# Patient Record
Sex: Male | Born: 1995
Health system: Southern US, Community
[De-identification: ages and names within clinical notes are randomized; demographics above are authoritative.]

## PROBLEM LIST (undated history)

## (undated) DIAGNOSIS — Z8616 Personal history of COVID-19: Secondary | ICD-10-CM

## (undated) DIAGNOSIS — I420 Dilated cardiomyopathy: Secondary | ICD-10-CM

## (undated) DIAGNOSIS — I513 Intracardiac thrombosis, not elsewhere classified: Secondary | ICD-10-CM

## (undated) DIAGNOSIS — I10 Essential (primary) hypertension: Secondary | ICD-10-CM

## (undated) DIAGNOSIS — F32A Depression, unspecified: Secondary | ICD-10-CM

## (undated) DIAGNOSIS — F419 Anxiety disorder, unspecified: Secondary | ICD-10-CM

## (undated) HISTORY — PX: TONSILLECTOMY: SUR1361

## (undated) NOTE — *Deleted (*Deleted)
Patient ID: Joel [name], male   DOB: 1995-09-01, 85 y.o.   MRN: [id number]    Principal Problem:   Acute systolic CHF (congestive heart failure) (HCC) Active Problems:   LV (left ventricular) mural thrombus without MI (HCC)   Dilated cardiomyopathy (HCC)   History of COVID-19   Multifocal pneumonia   Cerebral embolism with cerebral infarction   See complete radiology results below  MRI head showed multifocal bilateral frontoparietal infarcts.   CMRI 1. Possible left basilar PNA. 2. Small circumferential pericardial effusion. 3. Severe LV dilation with diffuse hypokinesis, EF 15%. There is a small LV apical thrombus. 4. Moderate RV dilation with EF 19%. 5. LGE pattern concerning for myocarditis, with diffuse patchy mid-wall LGE.  Joel [name] is a 52 y.o. male with a hx ofhypertension,anxiety and prior COVID-19 infection in June in 2021 who presented to [facility] with shortness of breath and hemoptysis x 1 month. An echocardiogram demonstrated EF <20% and an LV thrombus. A CT scan demonstrated multifocal pneumonia. He was started on Furosemide and IV Heparin and transferred to [facility] 10/24 for further evaluation and management.  Consultants: None  1. Acute Biventricular HF: Echo at [facility] with EF <20%, LV thrombus noted. He has had dyspnea, orthopnea, and pleuritic chest pain since he had COVID-19 in June. He feels like he never improved. No family history of cardiomyopathy or premature CAD. HIV negative. CMRI LVEF 15%, RVEF 19%, LGE pattern concerning for myocarditis. Suspect most likely etiology for cardiomyopathy is viral myocarditis due to COVID-19. CVP 5today, he is now off milrinone with co-ox 62%. BP controlled. -Continue Lasix 40 mg daily.  - Continue digoxin 0.125 mg.  - Continue Entresto 97/103 bid.  - Spironolactone 25 daily. - Continue Farxiga 10 mg daily  -Increase Coreg to 6.25 mg bid.  2. ID: CT chest and CMRI  concerning for multifocal PNA. COVID-19 negative at [place]. ?Sequelae of prior COVID-19 infection with lung scarring or bacterial PNA.  Blood cultures negative x2.  Respiratory culture with rare gram-positive cocci, no WBCs.He completed a course of ceftriaxone/doxycycline for CAP.  He remained afebrile with only minimal elevation in his WBCs.  3. LV thrombus:On warfarin,INR 2.2today.   4. AKI: BUN/creatinine on admission 26/1.78, steadily improved, discharge labs below  5. CVA: Cardioembolic. Seems to have minimal residual symptoms (tingling left upper arm). Therapeutic INR on warfarin.  6. Suspect OSA: Sleep study as outpatient.  7. Disposition: Home today. Will need followup in coumadin clinic at [facility] office. Followup CHF clinic 7-10 days. Meds for home: Lasix 40 mg daily, digoxin 0.125 daily, Entresto 97/103 bid, warfarin per coumadin clinic, spironolactone 25 mg daily, Farxiga 10 mg daily, Coreg 6.25 mg bid, atorvastatin 10 mg daily.

---
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
## 2017-02-11 DIAGNOSIS — J019 Acute sinusitis, unspecified: Secondary | ICD-10-CM | POA: Diagnosis not present

## 2019-10-26 DIAGNOSIS — J302 Other seasonal allergic rhinitis: Secondary | ICD-10-CM | POA: Insufficient documentation

## 2020-01-27 ENCOUNTER — Telehealth: Payer: Self-pay | Admitting: Infectious Diseases

## 2020-01-27 ENCOUNTER — Other Ambulatory Visit: Payer: Self-pay | Admitting: Infectious Diseases

## 2020-01-27 DIAGNOSIS — U071 COVID-19: Secondary | ICD-10-CM

## 2020-01-27 DIAGNOSIS — Z6837 Body mass index (BMI) 37.0-37.9, adult: Secondary | ICD-10-CM

## 2020-01-27 NOTE — Progress Notes (Signed)
I connected by phone with Joel [name] on 01/27/2020 at 1:30 PM to discuss the potential use of a new treatment for mild to moderate COVID-19 viral infection in non-hospitalized patients.  This patient is a 24 y.o. male that meets the FDA criteria for Emergency Use Authorization of COVID monoclonal antibody casirivimab/imdevimab.  Has a (+) direct SARS-CoV-2 viral test result  Has mild or moderate COVID-19   Is NOT hospitalized due to COVID-19  Is within 10 days of symptom onset  Has at least one of the high risk factor(s) for progression to severe COVID-19 and/or hospitalization as defined in EUA.  Specific high risk criteria : BMI > 25   I have spoken and communicated the following to the patient or parent/caregiver regarding COVID monoclonal antibody treatment:  1. FDA has authorized the emergency use for the treatment of mild to moderate COVID-19 in adults and pediatric patients with positive results of direct SARS-CoV-2 viral testing who are 16 years of age and older weighing at least 40 kg, and who are at high risk for progressing to severe COVID-19 and/or hospitalization.  2. The significant known and potential risks and benefits of COVID monoclonal antibody, and the extent to which such potential risks and benefits are unknown.  3. Information on available alternative treatments and the risks and benefits of those alternatives, including clinical trials.  4. Patients treated with COVID monoclonal antibody should continue to self-isolate and use infection control measures (e.g., wear mask, isolate, social distance, avoid sharing personal items, clean and disinfect "high touch" surfaces, and frequent handwashing) according to CDC guidelines.   5. The patient or parent/caregiver has the option to accept or refuse COVID monoclonal antibody treatment.  After reviewing this information with the patient, The patient agreed to proceed with receiving casirivimab\imdevimab infusion  and will be provided a copy of the Fact sheet prior to receiving the infusion.   [name] 01/27/2020 1:30 PM

## 2020-01-27 NOTE — Telephone Encounter (Signed)
Called to discuss with patient about Covid symptoms and the use of Regeneron, a monoclonal antibody infusion for those with mild to moderate Covid symptoms and at a high risk of hospitalization.  Pt is qualified for this infusion at the [facility] infusion center due to BMI>35.    Symptoms started Sunday 7/25 with loss of taste and smell, fevers, body aches, cough.   The following was reviewed with the patient including possible cost associated with he appointment.   Infusion clinic site is:  --[address] - there is a Freight forwarder marking the entrance to parking.     --Park in one of the marked spaces and call the front desk for assistance inside [phone].   --Average time in department is roughly 3 hours for Regeneron treatment - this includes preparation of the medication, IV start and the required 1 hour monitoring after the infusion.    Should you develop worsening shortness of breath, chest pain or severe breathing problems please do not wait for this appointment and go to the Emergency room for evaluation and treatment.   The day of your visit you should: Marland Kitchen Get plenty of rest the night before and drink plenty of water . Eat a light meal/snack before coming and take your medications as prescribed  . Wear warm, comfortable clothes with a shirt that can roll-up over the elbow (will need IV start).  . Wear a mask  . Consider bringing some activity to help pass the time  I hope this helps find you feeling better,  [name], NP

## 2020-01-28 MED ORDER — SODIUM CHLORIDE 0.9 % IV SOLN
Freq: Once | INTRAVENOUS | Status: AC
  Filled 2020-01-28: qty 600

## 2020-01-29 ENCOUNTER — Ambulatory Visit (HOSPITAL_COMMUNITY)
Admission: RE | Admit: 2020-01-29 | Discharge: 2020-01-29 | Disposition: A | Discharge status: Home / Self Care | Payer: HRSA Program | Source: Ambulatory Visit | Attending: Pulmonary Disease | Admitting: Pulmonary Disease

## 2020-01-29 DIAGNOSIS — U071 COVID-19: Secondary | ICD-10-CM | POA: Diagnosis not present

## 2020-01-29 DIAGNOSIS — Z6837 Body mass index (BMI) 37.0-37.9, adult: Secondary | ICD-10-CM | POA: Insufficient documentation

## 2020-01-29 MED ORDER — METHYLPREDNISOLONE SODIUM SUCC 125 MG IJ SOLR: 125.0000 mg | Freq: Once | INTRAMUSCULAR | Status: DC | PRN

## 2020-01-29 MED ORDER — EPINEPHRINE 0.3 MG/0.3ML IJ SOAJ: 0.3000 mg | Freq: Once | INTRAMUSCULAR | Status: DC | PRN

## 2020-01-29 MED ORDER — FAMOTIDINE IN NACL 20-0.9 MG/50ML-% IV SOLN: 20.0000 mg | Freq: Once | INTRAVENOUS | Status: DC | PRN

## 2020-01-29 MED ORDER — SODIUM CHLORIDE 0.9 % IV SOLN: INTRAVENOUS | Status: DC | PRN

## 2020-01-29 MED ORDER — DIPHENHYDRAMINE HCL 50 MG/ML IJ SOLN: 50.0000 mg | Freq: Once | INTRAMUSCULAR | Status: DC | PRN

## 2020-01-29 MED ORDER — ALBUTEROL SULFATE HFA 108 (90 BASE) MCG/ACT IN AERS: 2.0000 | INHALATION_SPRAY | Freq: Once | RESPIRATORY_TRACT | Status: DC | PRN

## 2020-01-29 NOTE — Progress Notes (Addendum)
  Diagnosis: COVID-19  Physician:Dr Delford Field   Procedure: Covid Infusion Clinic Med: casirivimab\imdevimab infusion - Provided patient with casirivimab\imdevimab fact sheet for patients, parents and caregivers prior to infusion.  Complications: Pt had vasovagal response when IV infiltrated. Pt had hypotension,was diaphoretic and pale.  Discharge: Discharged home   Saul Fordyce 01/29/2020

## 2020-01-29 NOTE — Discharge Instructions (Signed)

## 2020-04-24 ENCOUNTER — Inpatient Hospital Stay (HOSPITAL_COMMUNITY): Payer: Self-pay

## 2020-04-24 ENCOUNTER — Other Ambulatory Visit: Payer: Self-pay

## 2020-04-24 ENCOUNTER — Encounter (HOSPITAL_COMMUNITY): Payer: Self-pay | Admitting: Cardiology

## 2020-04-24 ENCOUNTER — Inpatient Hospital Stay (HOSPITAL_COMMUNITY)
Admission: AD | Admit: 2020-04-24 | Discharge: 2020-05-01 | DRG: 291 | Disposition: A | Discharge status: Home / Self Care | Payer: Self-pay | Source: Other Acute Inpatient Hospital | Attending: Cardiology | Admitting: Cardiology

## 2020-04-24 ENCOUNTER — Inpatient Hospital Stay: Payer: Self-pay

## 2020-04-24 DIAGNOSIS — I5082 Biventricular heart failure: Secondary | ICD-10-CM | POA: Diagnosis not present

## 2020-04-24 DIAGNOSIS — I5021 Acute systolic (congestive) heart failure: Secondary | ICD-10-CM

## 2020-04-24 DIAGNOSIS — I240 Acute coronary thrombosis not resulting in myocardial infarction: Secondary | ICD-10-CM

## 2020-04-24 DIAGNOSIS — I513 Intracardiac thrombosis, not elsewhere classified: Secondary | ICD-10-CM | POA: Diagnosis present

## 2020-04-24 DIAGNOSIS — I420 Dilated cardiomyopathy: Secondary | ICD-10-CM

## 2020-04-24 DIAGNOSIS — Z79899 Other long term (current) drug therapy: Secondary | ICD-10-CM

## 2020-04-24 DIAGNOSIS — I5023 Acute on chronic systolic (congestive) heart failure: Secondary | ICD-10-CM | POA: Diagnosis present

## 2020-04-24 DIAGNOSIS — B3324 Viral cardiomyopathy: Secondary | ICD-10-CM | POA: Diagnosis present

## 2020-04-24 DIAGNOSIS — J129 Viral pneumonia, unspecified: Secondary | ICD-10-CM | POA: Diagnosis present

## 2020-04-24 DIAGNOSIS — I110 Hypertensive heart disease with heart failure: Principal | ICD-10-CM | POA: Diagnosis present

## 2020-04-24 DIAGNOSIS — R29707 NIHSS score 7: Secondary | ICD-10-CM | POA: Diagnosis not present

## 2020-04-24 DIAGNOSIS — I5022 Chronic systolic (congestive) heart failure: Secondary | ICD-10-CM

## 2020-04-24 DIAGNOSIS — B948 Sequelae of other specified infectious and parasitic diseases: Secondary | ICD-10-CM

## 2020-04-24 DIAGNOSIS — I400 Infective myocarditis: Secondary | ICD-10-CM | POA: Diagnosis present

## 2020-04-24 DIAGNOSIS — J188 Other pneumonia, unspecified organism: Secondary | ICD-10-CM

## 2020-04-24 DIAGNOSIS — N179 Acute kidney failure, unspecified: Secondary | ICD-10-CM | POA: Diagnosis present

## 2020-04-24 DIAGNOSIS — I6340 Cerebral infarction due to embolism of unspecified cerebral artery: Secondary | ICD-10-CM | POA: Insufficient documentation

## 2020-04-24 DIAGNOSIS — Z8616 Personal history of COVID-19: Secondary | ICD-10-CM

## 2020-04-24 DIAGNOSIS — Z823 Family history of stroke: Secondary | ICD-10-CM

## 2020-04-24 DIAGNOSIS — G8101 Flaccid hemiplegia affecting right dominant side: Secondary | ICD-10-CM | POA: Diagnosis not present

## 2020-04-24 DIAGNOSIS — I313 Pericardial effusion (noninflammatory): Secondary | ICD-10-CM | POA: Diagnosis present

## 2020-04-24 DIAGNOSIS — I63412 Cerebral infarction due to embolism of left middle cerebral artery: Secondary | ICD-10-CM | POA: Diagnosis not present

## 2020-04-24 DIAGNOSIS — R0989 Other specified symptoms and signs involving the circulatory and respiratory systems: Secondary | ICD-10-CM | POA: Diagnosis present

## 2020-04-24 DIAGNOSIS — I340 Nonrheumatic mitral (valve) insufficiency: Secondary | ICD-10-CM

## 2020-04-24 DIAGNOSIS — Z23 Encounter for immunization: Secondary | ICD-10-CM

## 2020-04-24 DIAGNOSIS — I50814 Right heart failure due to left heart failure: Secondary | ICD-10-CM | POA: Diagnosis present

## 2020-04-24 DIAGNOSIS — Z833 Family history of diabetes mellitus: Secondary | ICD-10-CM

## 2020-04-24 DIAGNOSIS — R0602 Shortness of breath: Secondary | ICD-10-CM

## 2020-04-24 DIAGNOSIS — F419 Anxiety disorder, unspecified: Secondary | ICD-10-CM | POA: Diagnosis present

## 2020-04-24 DIAGNOSIS — G839 Paralytic syndrome, unspecified: Secondary | ICD-10-CM | POA: Diagnosis present

## 2020-04-24 DIAGNOSIS — J189 Pneumonia, unspecified organism: Secondary | ICD-10-CM

## 2020-04-24 DIAGNOSIS — F32A Depression, unspecified: Secondary | ICD-10-CM | POA: Diagnosis present

## 2020-04-24 DIAGNOSIS — I361 Nonrheumatic tricuspid (valve) insufficiency: Secondary | ICD-10-CM

## 2020-04-24 DIAGNOSIS — I429 Cardiomyopathy, unspecified: Secondary | ICD-10-CM

## 2020-04-24 DIAGNOSIS — Z720 Tobacco use: Secondary | ICD-10-CM

## 2020-04-24 DIAGNOSIS — I63312 Cerebral infarction due to thrombosis of left middle cerebral artery: Secondary | ICD-10-CM | POA: Diagnosis not present

## 2020-04-24 DIAGNOSIS — I472 Ventricular tachycardia: Secondary | ICD-10-CM | POA: Diagnosis not present

## 2020-04-24 HISTORY — DX: Depression, unspecified: F32.A

## 2020-04-24 HISTORY — DX: Personal history of COVID-19: Z86.16

## 2020-04-24 HISTORY — DX: Anxiety disorder, unspecified: F41.9

## 2020-04-24 HISTORY — DX: Chronic systolic (congestive) heart failure: I50.22

## 2020-04-24 HISTORY — DX: Essential (primary) hypertension: I10

## 2020-04-24 HISTORY — DX: Dilated cardiomyopathy: I42.0

## 2020-04-24 HISTORY — DX: Intracardiac thrombosis, not elsewhere classified: I51.3

## 2020-04-24 LAB — CBC
HCT: 44.6 % (ref 39.0–52.0)
Hemoglobin: 14.3 g/dL (ref 13.0–17.0)
MCH: 30.2 pg (ref 26.0–34.0)
MCHC: 32.1 g/dL (ref 30.0–36.0)
MCV: 94.1 fL (ref 80.0–100.0)
Platelets: 233 10*3/uL (ref 150–400)
RBC: 4.74 MIL/uL (ref 4.22–5.81)
RDW: 14.5 % (ref 11.5–15.5)
WBC: 15.4 10*3/uL — ABNORMAL HIGH (ref 4.0–10.5)
nRBC: 0 % (ref 0.0–0.2)

## 2020-04-24 LAB — GLUCOSE, CAPILLARY
Glucose-Capillary: 97 mg/dL (ref 70–99)
Glucose-Capillary: 107 mg/dL — ABNORMAL HIGH (ref 70–99)

## 2020-04-24 LAB — HEPARIN LEVEL (UNFRACTIONATED): Heparin Unfractionated: <0.1 IU/mL — ABNORMAL LOW (ref 0.30–0.70)

## 2020-04-24 LAB — COOXEMETRY PANEL
Carboxyhemoglobin: 1.2 % (ref 0.5–1.5)
Methemoglobin: 0.8 % (ref 0.0–1.5)
O2 Saturation: 52.5 %
Total hemoglobin: 14.7 g/dL (ref 12.0–16.0)

## 2020-04-24 LAB — PROCALCITONIN: Procalcitonin: 0.92 ng/mL

## 2020-04-24 LAB — MRSA PCR SCREENING: MRSA by PCR: NEGATIVE

## 2020-04-24 LAB — HIV ANTIBODY (ROUTINE TESTING W REFLEX): HIV Screen 4th Generation wRfx: NONREACTIVE

## 2020-04-24 MED ORDER — DOXYCYCLINE HYCLATE 100 MG PO TABS
100.0000 mg | ORAL_TABLET | Freq: Two times a day (BID) | ORAL | Status: DC
  Administered 2020-04-24 – 2020-04-29 (×10): 100 mg via ORAL
  Filled 2020-04-24 (×11): qty 1

## 2020-04-24 MED ORDER — AZITHROMYCIN 250 MG PO TABS: 250.0000 mg | ORAL_TABLET | Freq: Every day | ORAL | Status: DC

## 2020-04-24 MED ORDER — POTASSIUM CHLORIDE CRYS ER 20 MEQ PO TBCR
20.0000 meq | EXTENDED_RELEASE_TABLET | Freq: Two times a day (BID) | ORAL | Status: DC
  Administered 2020-04-24 – 2020-04-25 (×2): 20 meq via ORAL
  Filled 2020-04-24 (×2): qty 1

## 2020-04-24 MED ORDER — SODIUM CHLORIDE 0.9% FLUSH
3.0000 mL | Freq: Two times a day (BID) | INTRAVENOUS | Status: DC
  Administered 2020-04-24 – 2020-05-01 (×11): 3 mL via INTRAVENOUS

## 2020-04-24 MED ORDER — SODIUM CHLORIDE 0.9% FLUSH: 10.0000 mL | INTRAVENOUS | Status: DC | PRN

## 2020-04-24 MED ORDER — SODIUM CHLORIDE 0.9 % IV SOLN
1.0000 g | INTRAVENOUS | Status: DC
  Administered 2020-04-24 – 2020-04-25 (×2): 1 g via INTRAVENOUS
  Filled 2020-04-24 (×3): qty 10

## 2020-04-24 MED ORDER — SODIUM CHLORIDE 0.9 % IV SOLN: 250.0000 mL | INTRAVENOUS | Status: DC | PRN

## 2020-04-24 MED ORDER — CHLORHEXIDINE GLUCONATE CLOTH 2 % EX PADS
6.0000 | MEDICATED_PAD | Freq: Every day | CUTANEOUS | Status: DC
  Administered 2020-04-24 – 2020-05-01 (×7): 6 via TOPICAL

## 2020-04-24 MED ORDER — SACUBITRIL-VALSARTAN 24-26 MG PO TABS
1.0000 | ORAL_TABLET | Freq: Two times a day (BID) | ORAL | Status: DC
  Administered 2020-04-24 – 2020-04-28 (×8): 1 via ORAL
  Filled 2020-04-24 (×9): qty 1

## 2020-04-24 MED ORDER — SODIUM CHLORIDE 0.9% FLUSH: 3.0000 mL | INTRAVENOUS | Status: DC | PRN

## 2020-04-24 MED ORDER — ONDANSETRON HCL 4 MG/2ML IJ SOLN: 4.0000 mg | Freq: Four times a day (QID) | INTRAMUSCULAR | Status: DC | PRN

## 2020-04-24 MED ORDER — FUROSEMIDE 10 MG/ML IJ SOLN: 40.0000 mg | Freq: Two times a day (BID) | INTRAMUSCULAR | Status: DC

## 2020-04-24 MED ORDER — HEPARIN BOLUS VIA INFUSION
2000.0000 [IU] | Freq: Once | INTRAVENOUS | Status: AC
  Administered 2020-04-24: 2000 [IU] via INTRAVENOUS
  Filled 2020-04-24: qty 2000

## 2020-04-24 MED ORDER — FUROSEMIDE 10 MG/ML IJ SOLN
60.0000 mg | Freq: Two times a day (BID) | INTRAMUSCULAR | Status: DC
  Administered 2020-04-24 – 2020-04-25 (×2): 60 mg via INTRAVENOUS
  Filled 2020-04-24 (×2): qty 6

## 2020-04-24 MED ORDER — SODIUM CHLORIDE 0.9% FLUSH
10.0000 mL | Freq: Two times a day (BID) | INTRAVENOUS | Status: DC
  Administered 2020-04-24 – 2020-05-01 (×13): 10 mL

## 2020-04-24 MED ORDER — HEPARIN (PORCINE) 25000 UT/250ML-% IV SOLN
2200.0000 [IU]/h | INTRAVENOUS | Status: AC
  Administered 2020-04-24: 1250 [IU]/h via INTRAVENOUS
  Administered 2020-04-25: 1900 [IU]/h via INTRAVENOUS
  Administered 2020-04-25: 1650 [IU]/h via INTRAVENOUS
  Filled 2020-04-24 (×4): qty 250

## 2020-04-24 MED ORDER — ACETAMINOPHEN 325 MG PO TABS
650.0000 mg | ORAL_TABLET | ORAL | Status: DC | PRN
  Administered 2020-04-24 – 2020-04-26 (×4): 650 mg via ORAL
  Filled 2020-04-24 (×4): qty 2

## 2020-04-24 MED ORDER — SPIRONOLACTONE 12.5 MG HALF TABLET
12.5000 mg | ORAL_TABLET | Freq: Every day | ORAL | Status: DC
  Administered 2020-04-24 – 2020-04-26 (×3): 12.5 mg via ORAL
  Filled 2020-04-24 (×3): qty 1

## 2020-04-24 MED ORDER — AZITHROMYCIN 500 MG PO TABS
500.0000 mg | ORAL_TABLET | Freq: Every day | ORAL | Status: DC
  Filled 2020-04-24: qty 1

## 2020-04-24 NOTE — Progress Notes (Signed)
ANTICOAGULATION CONSULT NOTE - Initial Consult  Pharmacy Consult for heparin Indication: LV thrombus  No Known Allergies  Patient Measurements: Height: 6\' 2"  (188 cm) Weight: 122.6 kg (270 lb 4.5 oz) IBW/kg (Calculated) : 82.2 Heparin Dosing Weight: 108.7 kg  Vital Signs: Temp: 98.6 F (37 C) (10/24 1400) Temp Source: Oral (10/24 1400) BP: 138/103 (10/24 1435) Pulse Rate: 101 (10/24 1435)  Labs: No results for input(s): HGB, HCT, PLT, APTT, LABPROT, INR, HEPARINUNFRC, HEPRLOWMOCWT, CREATININE, CKTOTAL, CKMB, TROPONINIHS in the last 72 hours.  CrCl cannot be calculated (No successful lab value found.).   Medical History: Past Medical History:  Diagnosis Date  . Acute systolic CHF (congestive heart failure) (HCC) 04/24/2020  . Anxiety   . Depression   . Hypertension     Medications:  Scheduled:  . Chlorhexidine Gluconate Cloth  6 each Topical Daily  . doxycycline  100 mg Oral Q12H  . furosemide  60 mg Intravenous BID  . potassium chloride  20 mEq Oral BID  . sacubitril-valsartan  1 tablet Oral BID  . sodium chloride flush  10-40 mL Intracatheter Q12H  . sodium chloride flush  3 mL Intravenous Q12H  . spironolactone  12.5 mg Oral Daily    Assessment: 24 yof transferred from [facility] with SOB and hemoptysis for 1 month. ECHO found LVEF<20% and LV thrombus.   Per OSH - heparin 5000 units was started at 1130 on 10/24 then 1250 units/hr. Baseline labs included INR 1.2, Hgb 14.6, plt 280. Total bili 2.4, scr 1.4.   Heparin level tonight came back undetectable, on 1250 units/hr. Hgb 14.3, plt 233. No s/sx of bleeding or infusion issues.   Goal of Therapy:  Heparin level 0.3-0.7 units/ml Monitor platelets by anticoagulation protocol: Yes   Plan:  Order 2000 unit bolus Increase heparin infusion to 1450 units/hr Order heparin level in 6 hr Monitor daily HL, CBC, and for s/sx of bleeding  2001, PharmD, BCCCP Clinical Pharmacist  Phone:  [phone] 04/24/2020 9:44 PM  Please check AMION for all [facility] Pharmacy phone numbers After 10:00 PM, call Main Pharmacy [phone]

## 2020-04-24 NOTE — H&P (Addendum)
Cardiology Admission History and Physical:   Patient ID: Joel [name] MRN: [id number]; DOB: 12-25-1995   Admission date: 04/24/2020  Primary Care Provider: No primary care provider on file. [facility] Cardiologist:  New  [facility] Electrophysiologist:  None    Chief Complaint:  Shortness of breath   Patient Profile:   Joel [name] is a 24 y.o. male with a hx of hypertension, anxiety and prior COVID-19 infection in June in 2021 who presented to [facility] with shortness of breath and hemoptysis x 1 month.  An echocardiogram demonstrated EF < 20% and an LV thrombus.  A CT scan demonstrated multifocal pneumonia.  He was started on Furosemide and IV Heparin and transferred to [facility] for further evaluation and management.    History of Present Illness:   Joel [name] has noted worsening shortness of breath of the past month with associated hemoptysis.  He notes symptoms of orthopnea as well as chest tightness.  Chest tightness usually occurs in the mornings.  He has felt palpitations.  He has not had syncope.  He presented to [facility] for further evaluation.  D-dimer was elevated.  CT was negative for pulmonary embolism.  There was multiple infiltrates with suspicion for multifocal pneumonia.  BNP was elevated.  Echocardiogram demonstrated EF <20% and LV thrombus.  He was given IV furosemide and placed on carvedilol.  He was seen by Dr. Bing Matter with [facility] who recommended transfer to [facility] for further evaluation and management.   Past Medical History:  Diagnosis Date  . Anxiety   . Chronic systolic CHF 04/24/2020   Echo at [facility] 04/2020: EF < 20, mild to mod MR, LV thrombus  . Depression   . Dilated cardiomyopathy (HCC)   . History of COVID-19   . Hypertension   . LV (left ventricular) mural thrombus     Past Surgical History:  Procedure Laterality Date  . TONSILLECTOMY       Medications Prior to Admission: Lexapro  20 mg daily, Allegra daily, hydroxyzine 100 mg 3 times a day  Allergies:   No Known Allergies  Social History:   Social History   Socioeconomic History  . Marital status: Single    Spouse name: Not on file  . Number of children: Not on file  . Years of education: Not on file  . Highest education level: Not on file  Occupational History  . Not on file  Tobacco Use  . Smoking status: Former Smoker    Types: E-cigarettes  . Smokeless tobacco: Current User    Types: Chew  . Tobacco comment: quit around June 2021  Vaping Use  . Vaping Use: Former  . Substances: Nicotine  Substance and Sexual Activity  . Alcohol use: Not Currently    Comment: Patient used to drink occasionally a beer, but stopped when started lexapro  . Drug use: Never  . Sexual activity: Yes    Birth control/protection: Condom  Other Topics Concern  . Not on file  Social History Narrative  . Not on file   Social Determinants of Health   Financial Resource Strain:   . Difficulty of Paying Living Expenses: Not on file  Food Insecurity:   . Worried About Programme researcher, broadcasting/film/video in the Last Year: Not on file  . Ran Out of Food in the Last Year: Not on file  Transportation Needs:   . Lack of Transportation (Medical): Not on file  . Lack of Transportation (Non-Medical): Not on  file  Physical Activity:   . Days of Exercise per Week: Not on file  . Minutes of Exercise per Session: Not on file  Stress:   . Feeling of Stress : Not on file  Social Connections:   . Frequency of Communication with Friends and Family: Not on file  . Frequency of Social Gatherings with Friends and Family: Not on file  . Attends Religious Services: Not on file  . Active Member of Clubs or Organizations: Not on file  . Attends Banker Meetings: Not on file  . Marital Status: Not on file  Intimate Partner Violence:   . Fear of Current or Ex-Partner: Not on file  . Emotionally Abused: Not on file  . Physically Abused: Not  on file  . Sexually Abused: Not on file    Family History:   The patient's family history is negative for Heart failure.    ROS:  Please see the history of present illness.  He has not had melena, hematochezia, hematuria. All other ROS reviewed and negative.     Physical Exam/Data:   Vitals:   04/24/20 1340 04/24/20 1400 04/24/20 1435 04/24/20 1500  BP: (!) 136/95  (!) 138/103 (!) 138/116  Pulse:  100 (!) 101 (!) 54  Resp:  (!) 31 (!) 35 (!) 40  Temp:  98.6 F (37 C)    TempSrc:  Oral    SpO2:  99% 97% 93%  Weight:  122.6 kg    Height:  6\' 2"  (1.88 m)      Intake/Output Summary (Last 24 hours) at 04/24/2020 1629 Last data filed at 04/24/2020 1400 Gross per 24 hour  Intake --  Output 300 ml  Net -300 ml   Last 3 Weights 04/24/2020  Weight (lbs) 270 lb 4.5 oz  Weight (kg) 122.6 kg     Body mass index is 34.7 kg/m.  General:  Well nourished, well developed, in no acute distress  HEENT: normal Lymph: no adenopathy Neck: + JVD up to the angle of his jaw Endocrine:  No thryomegaly Vascular: No carotid bruits  Cardiac: Tachycardia, regular rhythm, no obvious murmur Lungs: Diffuse rales noted, no wheezing Abd: soft, nontender, no hepatomegaly  Ext: no edema Musculoskeletal:  No deformities, BUE and BLE strength normal and equal Skin: warm and dry  Neuro:  CNs 2-12 intact, no focal abnormalities noted Psych:  Normal affect    EKG:  The ECG that was done today via [facility] was personally reviewed and demonstrates sinus tachycardia, HR 109, normal axis, T wave inversions 2, 3, aVF, V4-V6, QTC 435  Relevant CV Studies: Echocardiogram report not currently available from [facility] but consult note from cardiology indicates EF <20%, cardiomegaly, mild-moderate mitral regurgitation, pedunculated large thrombus highly mobile in the apex  Laboratory Data: From [facility] 04/23/2020:  Hgb 14.6 BUN 21 SCr 1.4 K+4.9 ALT 41 Total bilirubin 2.4 Lactic Acid  1.1 DDimer 1330 ProBNP 8050 Ferritin 135 CRP 61.5 Procalcitonin < 0.05 TnI 0.13-0.13-0.13 Inf A, Inf B, SARS-CoV-2 neg  Chest CTA No pulmonary embolism, LLL, RUL, RLL infiltrates c/w multifocal pneumonia, cardiac enlargement, small pericardial effusion  Radiology/Studies:  02-09-1982 EKG SITE RITE  Result Date: 04/24/2020 If Site Rite image not attached, placement could not be confirmed due to current cardiac rhythm.    Assessment and Plan:   1. Acute systolic CHF 2. Dilated CM He probably has a non-ischemic cardiomyopathy.  Question if he had myocarditis related to COVID-19 that resulted in his CM.  He  has multiple infiltrates on CT suspicious for pneumonia.  However, his Lactic Acid and Procalcitonin are normal.  This is probably edema.  Will hold off on antibiotics for now.  He will probably need a cardiac MRI to further evaluate for infiltrative CM.  Ferritin normal.  He says his BP is always high when he goes to the doctor but has never been put on meds.  However, I do not think this is a hypertensive CM.  Will get TSH.  Will hold off on beta-blocker give acute CHF.  His Creatinine should be able to tolerate ARNI.  Will continue diuresis.  -DC Carvedilol  -Start Entresto 24/26 mg twice daily   -Get PICC line or triple lumen placed  -Check Co-ox; follow CVP  -Continue Furosemide 40 mg IV twice daily  -K+ 20 mEq twice daily   -BMET Q AM   -Check TSH   3. LV Thrombus Continue IV Heparin.  Pharmacy to follow.  Will eventually need to transition to Warfarin or DOAC.    Attending MD to see later today.       New York Heart Association (NYHA) Functional Class NYHA Class IV   Severity of Illness: The appropriate patient status for this patient is INPATIENT. Inpatient status is judged to be reasonable and necessary in order to provide the required intensity of service to ensure the patient's safety. The patient's presenting symptoms, physical exam findings, and initial radiographic  and laboratory data in the context of their chronic comorbidities is felt to place them at high risk for further clinical deterioration. Furthermore, it is not anticipated that the patient will be medically stable for discharge from the hospital within 2 midnights of admission. The following factors support the patient status of inpatient.   " The patient's presenting symptoms include shortness of breath, hemoptysis. " The worrisome physical exam findings include rales, JVD. " The initial radiographic and laboratory data are worrisome because of multiple infiltrates on CT. " The chronic co-morbidities include dilated cardiomyopathy, systolic heart failure, left ventricular thrombus.   * I certify that at the point of admission it is my clinical judgment that the patient will require inpatient hospital care spanning beyond 2 midnights from the point of admission due to high intensity of service, high risk for further deterioration and high frequency of surveillance required.*    For questions or updates, please contact [facility] Please consult [email or web address] for contact info under     Signed, Tereso Newcomer, PA-C  04/24/2020 4:29 PM    Patient seen with PA, agree with the above note.   24 yo with history of HTN and COVID-19 infection in June was transferred from [facility] with acute systolic CHF and possible PNA.    Patient has no history of cardiac disease, also no significant family history of cardiac disease other than HTN.  He had COVID-19 in 6/21 and has not felt well since that time.  He has struggled with dyspnea and pleuritic chest tightness.  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  He has significant orthopnea. He has been back and forth to his PCP, was told he was anxious.  Finally, since he was not getting anywhere as an outpatient and dyspnea seemed to be worsening, he went to ER at [facility]. He was admitted and  echo was done, showing EF < 20% with LV apical thrombus.  He was started on heparin gtt. BP  elevated.   Additionally, CT chest was done that was concerning for multifocal PNA.  He has been afebrile.  PCT < 0.1 at [facility]. TnI 0.13 => 0.13.   General: NAD Neck: JVP 14+ cm,, no thyromegaly or thyroid nodule.  Lungs: Bilateral rhonchi.  CV: Nondisplaced PMI.  Heart regular S1/S2, no S3/S4, no murmur.  No peripheral edema.  No carotid bruit.  Normal pedal pulses.  Abdomen: Soft, nontender, no hepatosplenomegaly, no distention.  Skin: Intact without lesions or rashes.  Neurologic: Alert and oriented x 3.  Psych: Normal affect. Extremities: No clubbing or cyanosis.  HEENT: Normal.   1. Acute systolic CHF: Echo at [facility] with EF < 20%, LV thrombus noted.  He has had dyspnea, orthopnea, and pleuritic chest pain since he had COVID-19 in June. He feels like he never improved.  No family history of cardiomyopathy or premature CAD.  Suspect most likely etiology for cardiomyopathy is viral myocarditis due to COVID-19.  On exam, he is volume overloaded.   - PICC placed, will follow CVP and co-ox.  - Lasix 60 mg IV bid and follow response.  - Entresto 24/26 bid (has plenty of BP room).  - Spironolactone 12.5 daily.  - I will arrange for cardiac MRI to look for myocarditis/infiltrative disease.  - Check HIV, TSH.  2. ID: CT chest concerning for multifocal PNA.  No fever, WBCs normal and PCT not elevated.  ?Sequelae of prior COVID-19 infection.  - Will resend COVID-19.  - Culture blood and sputum.  - For now, will cover with ceftriaxone/doxycycline for CAP.  Low threshold to stop abx.  3. LV thrombus: Start heparin gtt, will eventually need warfarin.   [name] 04/24/2020 6:09 PM

## 2020-04-24 NOTE — Progress Notes (Signed)
Peripherally Inserted Central Catheter Placement  The IV Nurse has discussed with the patient and/or persons authorized to consent for the patient, the purpose of this procedure and the potential benefits and risks involved with this procedure.  The benefits include less needle sticks, lab draws from the catheter, and the patient may be discharged home with the catheter. Risks include, but not limited to, infection, bleeding, blood clot (thrombus formation), and puncture of an artery; nerve damage and irregular heartbeat and possibility to perform a PICC exchange if needed/ordered by physician.  Alternatives to this procedure were also discussed.  Bard Power PICC patient education guide, fact sheet on infection prevention and patient information card has been provided to patient /or left at bedside.    PICC Placement Documentation  PICC Triple Lumen 04/24/20 PICC Right Basilic 40 cm 0 cm (Active)  Indication for Insertion or Continuance of Line Vasoactive infusions;Prolonged intravenous therapies 04/24/20 1806  Exposed Catheter (cm) 0 cm 04/24/20 1806  Site Assessment Clean;Dry;Intact 04/24/20 1806  Lumen #1 Status Flushed;Saline locked;Blood return noted 04/24/20 1806  Lumen #2 Status Flushed;Saline locked;Blood return noted 04/24/20 1806  Lumen #3 Status Flushed;Saline locked;Blood return noted 04/24/20 1806  Dressing Type Transparent 04/24/20 1806  Dressing Status Clean;Dry;Intact 04/24/20 1806  Antimicrobial disc in place? Yes 04/24/20 1806  Safety Lock Not Applicable 04/24/20 1806  Line Care Connections checked and tightened 04/24/20 1806  Line Adjustment (NICU/IV Team Only) No 04/24/20 1806  Dressing Intervention New dressing 04/24/20 1806  Dressing Change Due 05/01/20 04/24/20 1806       Hughie Melroy, Lajean Manes 04/24/2020, 6:07 PM

## 2020-04-25 ENCOUNTER — Inpatient Hospital Stay (HOSPITAL_COMMUNITY): Payer: Self-pay

## 2020-04-25 DIAGNOSIS — I240 Acute coronary thrombosis not resulting in myocardial infarction: Secondary | ICD-10-CM

## 2020-04-25 DIAGNOSIS — I429 Cardiomyopathy, unspecified: Secondary | ICD-10-CM

## 2020-04-25 LAB — BASIC METABOLIC PANEL
Anion gap: 11 (ref 5–15)
BUN: 26 mg/dL — ABNORMAL HIGH (ref 6–20)
CO2: 21 mmol/L — ABNORMAL LOW (ref 22–32)
Calcium: 8.3 mg/dL — ABNORMAL LOW (ref 8.9–10.3)
Chloride: 104 mmol/L (ref 98–111)
Creatinine, Ser: 1.78 mg/dL — ABNORMAL HIGH (ref 0.61–1.24)
GFR, Estimated: 55 mL/min — ABNORMAL LOW (ref >60)
Glucose, Bld: 113 mg/dL — ABNORMAL HIGH (ref 70–99)
Potassium: 4.5 mmol/L (ref 3.5–5.1)
Sodium: 136 mmol/L (ref 135–145)

## 2020-04-25 LAB — COOXEMETRY PANEL
Carboxyhemoglobin: 1.1 % (ref 0.5–1.5)
Methemoglobin: 0.8 % (ref 0.0–1.5)
O2 Saturation: 54.9 %
Total hemoglobin: 16.6 g/dL — ABNORMAL HIGH (ref 12.0–16.0)

## 2020-04-25 LAB — EXPECTORATED SPUTUM ASSESSMENT W GRAM STAIN, RFLX TO RESP C

## 2020-04-25 LAB — TSH: TSH: 2.89 u[IU]/mL (ref 0.350–4.500)

## 2020-04-25 LAB — HEPARIN LEVEL (UNFRACTIONATED)
Heparin Unfractionated: <0.1 IU/mL — ABNORMAL LOW (ref 0.30–0.70)
Heparin Unfractionated: <0.1 IU/mL — ABNORMAL LOW (ref 0.30–0.70)

## 2020-04-25 MED ORDER — MILRINONE LACTATE IN DEXTROSE 20-5 MG/100ML-% IV SOLN
0.1250 ug/kg/min | INTRAVENOUS | Status: DC
  Administered 2020-04-25 – 2020-04-29 (×5): 0.125 ug/kg/min via INTRAVENOUS
  Filled 2020-04-25 (×6): qty 100

## 2020-04-25 MED ORDER — HEPARIN BOLUS VIA INFUSION
2000.0000 [IU] | Freq: Once | INTRAVENOUS | Status: AC
  Administered 2020-04-25: 2000 [IU] via INTRAVENOUS
  Filled 2020-04-25: qty 2000

## 2020-04-25 MED ORDER — DIGOXIN 125 MCG PO TABS
0.1250 mg | ORAL_TABLET | Freq: Every day | ORAL | Status: DC
  Administered 2020-04-25 – 2020-05-01 (×7): 0.125 mg via ORAL
  Filled 2020-04-25 (×7): qty 1

## 2020-04-25 MED ORDER — GADOBUTROL 1 MMOL/ML IV SOLN
10.0000 mL | Freq: Once | INTRAVENOUS | Status: AC | PRN
  Administered 2020-04-25: 10 mL via INTRAVENOUS

## 2020-04-25 NOTE — Progress Notes (Signed)
No complaints.   CVP:  [6 mmHg-19 mmHg] 6 mmHg  CO-OX 55% Worsening creatinine.  Milrinone started at 1518 and digoxin added.  Check CO-OX in am.    Shakeema Lippman NP-C  3:43 PM

## 2020-04-25 NOTE — Plan of Care (Signed)
  Problem: Education: Goal: Knowledge of General Education information will improve Description: Including pain rating scale, medication(s)/side effects and non-pharmacologic comfort measures Outcome: Progressing   Problem: Health Behavior/Discharge Planning: Goal: Ability to manage health-related needs will improve Outcome: Progressing   Problem: Clinical Measurements: Goal: Ability to maintain clinical measurements within normal limits will improve Outcome: Progressing Goal: Will remain free from infection Outcome: Progressing Goal: Diagnostic test results will improve Outcome: Progressing Goal: Respiratory complications will improve Outcome: Progressing Goal: Cardiovascular complication will be avoided Outcome: Progressing   Problem: Activity: Goal: Risk for activity intolerance will decrease Outcome: Progressing   Problem: Nutrition: Goal: Adequate nutrition will be maintained Outcome: Progressing   Problem: Coping: Goal: Level of anxiety will decrease Outcome: Progressing   Problem: Elimination: Goal: Will not experience complications related to bowel motility Outcome: Progressing Goal: Will not experience complications related to urinary retention Outcome: Progressing   Problem: Pain Managment: Goal: General experience of comfort will improve Outcome: Progressing   Problem: Safety: Goal: Ability to remain free from injury will improve Outcome: Progressing   Problem: Skin Integrity: Goal: Risk for impaired skin integrity will decrease Outcome: Progressing   Problem: Education: Goal: Ability to demonstrate management of disease process will improve Outcome: Progressing Goal: Ability to verbalize understanding of medication therapies will improve Outcome: Progressing Goal: Individualized Educational Video(s) Outcome: Progressing   Problem: Activity: Goal: Capacity to carry out activities will improve Outcome: Progressing   Problem: Cardiac: Goal:  Ability to achieve and maintain adequate cardiopulmonary perfusion will improve Outcome: Progressing   Problem: Activity: Goal: Ability to tolerate increased activity will improve Outcome: Progressing   Problem: Clinical Measurements: Goal: Ability to maintain a body temperature in the normal range will improve Outcome: Progressing   Problem: Respiratory: Goal: Ability to maintain adequate ventilation will improve Outcome: Progressing Goal: Ability to maintain a clear airway will improve Outcome: Progressing   

## 2020-04-25 NOTE — Progress Notes (Signed)
ANTICOAGULATION CONSULT NOTE   Pharmacy Consult for heparin Indication: LV thrombus  No Known Allergies  Patient Measurements: Height: 6\' 2"  (188 cm) Weight: 119.7 kg (263 lb 14.3 oz) IBW/kg (Calculated) : 82.2 Heparin Dosing Weight: 108.7 kg  Vital Signs: Temp: 97.6 F (36.4 C) (10/25 1521) Temp Source: Oral (10/25 1521) BP: 109/59 (10/25 1200) Pulse Rate: 105 (10/25 1200)  Labs: Recent Labs    04/24/20 1918 04/25/20 0207 04/25/20 1539  HGB 14.3  --   --   HCT 44.6  --   --   PLT 233  --   --   HEPARINUNFRC <0.10* <0.10* <0.10*  CREATININE  --  1.78*  --     Estimated Creatinine Clearance: 89.5 mL/min (A) (by C-G formula based on SCr of 1.78 mg/dL (H)).   Medical History: Past Medical History:  Diagnosis Date  . Anxiety   . Chronic systolic CHF 04/24/2020   Echo at [facility] 04/2020: EF < 20, mild to mod MR, LV thrombus  . Depression   . Dilated cardiomyopathy (HCC)   . History of COVID-19   . Hypertension   . LV (left ventricular) mural thrombus     Medications:  Scheduled:  . Chlorhexidine Gluconate Cloth  6 each Topical Daily  . digoxin  0.125 mg Oral Daily  . doxycycline  100 mg Oral Q12H  . sacubitril-valsartan  1 tablet Oral BID  . sodium chloride flush  10-40 mL Intracatheter Q12H  . sodium chloride flush  3 mL Intravenous Q12H  . spironolactone  12.5 mg Oral Daily    Assessment: 24 yof transferred from [facility] with SOB and hemoptysis for 1 month. ECHO found LVEF<20% and LV thrombus.   Per OSH - heparin 5000 units was started at 1130 on 10/24 then 1250 units/hr. Baseline labs included INR 1.2, Hgb 14.6, plt 280. Total bili 2.4, scr 1.4.   Heparin level this evening is still undetectable.  Discussed with RN and patient, no issues with IV infusion.  No overt bleeding or complications noted.   Goal of Therapy:  Heparin level 0.3-0.7 units/ml Monitor platelets by anticoagulation protocol: Yes   Plan:  Re-bolus heparin 2000  units x 1 Increase heparin infusion to 1900 units/hr Re-check heparin level in 6 hrs. Monitor daily HL, CBC, and for s/sx of bleeding  11/24, Reece Leader, [facility] Clinical Pharmacist  04/25/2020 4:46 PM   [facility] pharmacy phone numbers are listed on [email or web address]

## 2020-04-25 NOTE — Progress Notes (Signed)
Nutrition Brief Note  Patient identified on the Malnutrition Screening Tool (MST) Report.  24 year old male with PMH of HTN, anxiety, and prior COVID-19 infection in June in 2021. An echocardiogram demonstrated EF < 20% and an LV thrombus. A CT scan demonstrated multifocal pneumonia. Pt admitted with acute systolic CHF.  Discussed pt with RN and during ICU rounds. RN reports pt completed 100% of breakfast meal and reports no nutrition-related issues at this time.  Spoke with pt at bedside. He reports that his appetite is good and at his baseline. He states that he ate everything on his breakfast meal tray except a few bites of pancakes. He denies any recent changes in his weight but reports it does fluctuate between 260-280 lbs.  Weight down 7 lbs since admission (270 lbs to 263 lbs) related to diuresis.  Wt Readings from Last 15 Encounters:  04/25/20 119.7 kg    Body mass index is 33.88 kg/m. Patient meets criteria for obesity class I based on current BMI.   Current diet order is 2 gram sodium, patient is consuming approximately 100% of meals at this time. Labs and medications reviewed.   No nutrition interventions warranted at this time. If nutrition issues arise, please consult RD.    Joel Reading, MS, RD, LDN Inpatient Clinical Dietitian Please see AMiON for contact information.

## 2020-04-25 NOTE — Progress Notes (Signed)
ANTICOAGULATION CONSULT NOTE   Pharmacy Consult for heparin Indication: LV thrombus  No Known Allergies  Patient Measurements: Height: 6\' 2"  (188 cm) Weight: 122.6 kg (270 lb 4.5 oz) IBW/kg (Calculated) : 82.2 Heparin Dosing Weight: 108.7 kg  Vital Signs: Temp: 98.2 F (36.8 C) (10/24 2300) Temp Source: Oral (10/24 2300) BP: 133/104 (10/25 0300) Pulse Rate: 104 (10/25 0300)  Labs: Recent Labs    04/24/20 1918 04/25/20 0207  HGB 14.3  --   HCT 44.6  --   PLT 233  --   HEPARINUNFRC <0.10* <0.10*  CREATININE  --  1.78*    Estimated Creatinine Clearance: 90.6 mL/min (A) (by C-G formula based on SCr of 1.78 mg/dL (H)).   Medical History: Past Medical History:  Diagnosis Date  . Anxiety   . Chronic systolic CHF 04/24/2020   Echo at [facility] 04/2020: EF < 20, mild to mod MR, LV thrombus  . Depression   . Dilated cardiomyopathy (HCC)   . History of COVID-19   . Hypertension   . LV (left ventricular) mural thrombus     Medications:  Scheduled:  . Chlorhexidine Gluconate Cloth  6 each Topical Daily  . doxycycline  100 mg Oral Q12H  . furosemide  60 mg Intravenous BID  . heparin  2,000 Units Intravenous Once  . potassium chloride  20 mEq Oral BID  . sacubitril-valsartan  1 tablet Oral BID  . sodium chloride flush  10-40 mL Intracatheter Q12H  . sodium chloride flush  3 mL Intravenous Q12H  . spironolactone  12.5 mg Oral Daily    Assessment: 24 yof transferred from [facility] with SOB and hemoptysis for 1 month. ECHO found LVEF<20% and LV thrombus.   Per OSH - heparin 5000 units was started at 1130 on 10/24 then 1250 units/hr. Baseline labs included INR 1.2, Hgb 14.6, plt 280. Total bili 2.4, scr 1.4.   Heparin level tonight came back undetectable, on 1250 units/hr. Hgb 14.3, plt 233. No s/sx of bleeding or infusion issues.   10/25 AM update:  Heparin level low  Goal of Therapy:  Heparin level 0.3-0.7 units/ml Monitor platelets by  anticoagulation protocol: Yes   Plan:  Heparin 2000 units re-bolus Increase heparin infusion to 1650 units/hr Re-check heparin level at 1200 Monitor daily HL, CBC, and for s/sx of bleeding  11/25, PharmD, BCPS Clinical Pharmacist Phone: [phone]

## 2020-04-25 NOTE — Progress Notes (Addendum)
Patient ID: Joel [name], male   DOB: 02-28-1996, 24 y.o.   MRN: [id number]     Advanced Heart Failure Rounding Note  PCP-Cardiologist: No primary care provider on file.   Subjective:    Good diuresis yesterday, weight down 7 lbs.  Creatinine 1.78. No co-ox this morning, CVP 7.   Afebrile, PCT 0.92, WBCs 15. Remains on ceftriaxone/doxycycline.   He is breathing better.    Objective:   Weight Range: 119.7 kg Body mass index is 33.88 kg/m.   Vital Signs:   Temp:  [97.5 F (36.4 C)-98.6 F (37 C)] 98 F (36.7 C) (10/25 0755) Pulse Rate:  [54-112] 95 (10/25 0700) Resp:  [16-43] 39 (10/25 0700) BP: (123-146)/(92-123) 125/93 (10/25 0700) SpO2:  [93 %-100 %] 94 % (10/25 0700) Weight:  [119.7 kg-122.6 kg] 119.7 kg (10/25 0500) Last BM Date:  (PTA)  Weight change: Filed Weights   04/24/20 1400 04/25/20 0500  Weight: 122.6 kg 119.7 kg    Intake/Output:   Intake/Output Summary (Last 24 hours) at 04/25/2020 0937 Last data filed at 04/25/2020 0842 Gross per 24 hour  Intake 328.95 ml  Output 3025 ml  Net -2696.05 ml      Physical Exam    General:  Well appearing. No resp difficulty HEENT: Normal Neck: Supple. JVP not elevated. Carotids 2+ bilat; no bruits. No lymphadenopathy or thyromegaly appreciated. Cor: PMI nondisplaced. Regular rate & rhythm. No rubs, gallops or murmurs. Lungs: Clear Abdomen: Soft, nontender, nondistended. No hepatosplenomegaly. No bruits or masses. Good bowel sounds. Extremities: No cyanosis, clubbing, rash, edema Neuro: Alert & orientedx3, cranial nerves grossly intact. moves all 4 extremities w/o difficulty. Affect pleasant   Telemetry   NSR 90s (personally reviewed)   Labs    CBC Recent Labs    04/24/20 1918  WBC 15.4*  HGB 14.3  HCT 44.6  MCV 94.1  PLT 233   Basic Metabolic Panel Recent Labs    36/64/40 0207  NA 136  K 4.5  CL 104  CO2 21*  GLUCOSE 113*  BUN 26*  CREATININE 1.78*  CALCIUM 8.3*   Liver  Function Tests No results for input(s): AST, ALT, ALKPHOS, BILITOT, PROT, ALBUMIN in the last 72 hours. No results for input(s): LIPASE, AMYLASE in the last 72 hours. Cardiac Enzymes No results for input(s): CKTOTAL, CKMB, CKMBINDEX, TROPONINI in the last 72 hours.  BNP: BNP (last 3 results) No results for input(s): BNP in the last 8760 hours.  ProBNP (last 3 results) No results for input(s): PROBNP in the last 8760 hours.   D-Dimer No results for input(s): DDIMER in the last 72 hours. Hemoglobin A1C No results for input(s): HGBA1C in the last 72 hours. Fasting Lipid Panel No results for input(s): CHOL, HDL, LDLCALC, TRIG, CHOLHDL, LDLDIRECT in the last 72 hours. Thyroid Function Tests Recent Labs    04/25/20 0207  TSH 2.890    Other results:   Imaging    DG CHEST PORT 1 VIEW  Result Date: 04/24/2020 CLINICAL DATA:  Shortness of breath, CHF EXAM: PORTABLE CHEST 1 VIEW COMPARISON:  CT 04/23/2020 FINDINGS: Cardiomegaly is similar to prior. Mild pulmonary vascular congestion. Medial left lower lobe airspace opacity better seen on recent CT. No pleural effusion or pneumothorax. IMPRESSION: 1. Cardiomegaly with mild pulmonary vascular congestion. 2. Medial left lower lobe airspace opacity better seen on recent CT. Electronically Signed   By: Duanne Guess D.O.   On: 04/24/2020 16:37   [place] EKG SITE RITE  Result Date: 04/24/2020 If  Site Rite image not attached, placement could not be confirmed due to current cardiac rhythm.     Medications:     Scheduled Medications: . Chlorhexidine Gluconate Cloth  6 each Topical Daily  . doxycycline  100 mg Oral Q12H  . potassium chloride  20 mEq Oral BID  . sacubitril-valsartan  1 tablet Oral BID  . sodium chloride flush  10-40 mL Intracatheter Q12H  . sodium chloride flush  3 mL Intravenous Q12H  . spironolactone  12.5 mg Oral Daily     Infusions: . sodium chloride    . cefTRIAXone (ROCEPHIN)  IV Stopped (04/24/20 1951)    . heparin 1,650 Units/hr (04/25/20 0717)     PRN Medications:  sodium chloride, acetaminophen, ondansetron (ZOFRAN) IV, sodium chloride flush, sodium chloride flush   Assessment/Plan   1. Acute systolic CHF: Echo at [facility] with EF < 20%, LV thrombus noted.  He has had dyspnea, orthopnea, and pleuritic chest pain since he had COVID-19 in June. He feels like he never improved.  No family history of cardiomyopathy or premature CAD.  Suspect most likely etiology for cardiomyopathy is viral myocarditis due to COVID-19.  HIV negative. He diuresed well overnight, CVP down to 7.  Creatinine higher 1.4 => 1.78.  - Check co-ox this morning. May need milrinone temporarily if low given rise in creatinine.  - Stop Lasix.  - Continue Entresto 24/26 bid (has plenty of BP room) but follow creatinine closely.  - Spironolactone 12.5 daily.  - I will arrange for cardiac MRI to look for myocarditis/infiltrative disease today.  2. ID: CT chest concerning for multifocal PNA.  No fever, WBCs 15 and PCT 0.92.  COVID-19 negative at [place]. ?Sequelae of prior COVID-19 infection with lung scarring or bacterial PNA.  - Culture blood and sputum, pending results.  - For now, will cover with ceftriaxone/doxycycline for CAP.   3. LV thrombus: On heparin gtt, will eventually need warfarin.  4. AKI: Creatinine up to 1.78.  If co-ox is low, would add milrinone at least temporarily.   CRITICAL CARE Performed by: [name]  Total critical care time: 35 minutes  Critical care time was exclusive of separately billable procedures and treating other patients.  Critical care was necessary to treat or prevent imminent or life-threatening deterioration.  Critical care was time spent personally by me on the following activities: development of treatment plan with patient and/or surrogate as well as nursing, discussions with consultants, evaluation of patient's response to treatment, examination of patient, obtaining  history from patient or surrogate, ordering and performing treatments and interventions, ordering and review of laboratory studies, ordering and review of radiographic studies, pulse oximetry and re-evaluation of patient's condition.  Length of Stay: 1  [name], MD  04/25/2020, 9:37 AM  Advanced Heart Failure Team Pager [phone] (M-F; 7a - 4p)  Please contact [facility] for night-coverage after hours (4p -7a ) and weekends on [email or web address]  Co-ox 55%. With rise in creatinine and low co-ox, will start milrinone 0.125 + digoxin.   [name] 04/25/2020 11:20 AM

## 2020-04-26 ENCOUNTER — Encounter (HOSPITAL_COMMUNITY): Payer: Self-pay | Admitting: Cardiology

## 2020-04-26 ENCOUNTER — Inpatient Hospital Stay (HOSPITAL_COMMUNITY): Payer: Self-pay

## 2020-04-26 ENCOUNTER — Other Ambulatory Visit (HOSPITAL_COMMUNITY): Payer: Self-pay

## 2020-04-26 DIAGNOSIS — R531 Weakness: Secondary | ICD-10-CM

## 2020-04-26 DIAGNOSIS — I63412 Cerebral infarction due to embolism of left middle cerebral artery: Secondary | ICD-10-CM

## 2020-04-26 DIAGNOSIS — Z8616 Personal history of COVID-19: Secondary | ICD-10-CM

## 2020-04-26 LAB — COOXEMETRY PANEL
Carboxyhemoglobin: 0.9 % (ref 0.5–1.5)
Methemoglobin: 0.5 % (ref 0.0–1.5)
O2 Saturation: 59.9 %
Total hemoglobin: 16.4 g/dL — ABNORMAL HIGH (ref 12.0–16.0)

## 2020-04-26 LAB — MAGNESIUM: Magnesium: 1.8 mg/dL (ref 1.7–2.4)

## 2020-04-26 LAB — BASIC METABOLIC PANEL
Anion gap: 8 (ref 5–15)
BUN: 25 mg/dL — ABNORMAL HIGH (ref 6–20)
CO2: 23 mmol/L (ref 22–32)
Calcium: 8 mg/dL — ABNORMAL LOW (ref 8.9–10.3)
Chloride: 104 mmol/L (ref 98–111)
Creatinine, Ser: 1.34 mg/dL — ABNORMAL HIGH (ref 0.61–1.24)
GFR, Estimated: >60 mL/min (ref >60)
Glucose, Bld: 104 mg/dL — ABNORMAL HIGH (ref 70–99)
Potassium: 4.2 mmol/L (ref 3.5–5.1)
Sodium: 135 mmol/L (ref 135–145)

## 2020-04-26 LAB — CBC
HCT: 47.1 % (ref 39.0–52.0)
Hemoglobin: 15.2 g/dL (ref 13.0–17.0)
MCH: 30 pg (ref 26.0–34.0)
MCHC: 32.3 g/dL (ref 30.0–36.0)
MCV: 93.1 fL (ref 80.0–100.0)
Platelets: 248 10*3/uL (ref 150–400)
RBC: 5.06 MIL/uL (ref 4.22–5.81)
RDW: 14.5 % (ref 11.5–15.5)
WBC: 14.5 10*3/uL — ABNORMAL HIGH (ref 4.0–10.5)
nRBC: 0 % (ref 0.0–0.2)

## 2020-04-26 LAB — FIBRINOGEN: Fibrinogen: 742 mg/dL — ABNORMAL HIGH (ref 210–475)

## 2020-04-26 LAB — PROTIME-INR
INR: 1.3 — ABNORMAL HIGH (ref 0.8–1.2)
INR: 1.1 (ref 0.8–1.2)
Prothrombin Time: 15.7 seconds — ABNORMAL HIGH (ref 11.4–15.2)
Prothrombin Time: 14.1 seconds (ref 11.4–15.2)

## 2020-04-26 LAB — HEMOGLOBIN AND HEMATOCRIT, BLOOD
HCT: 46.2 % (ref 39.0–52.0)
Hemoglobin: 15.2 g/dL (ref 13.0–17.0)

## 2020-04-26 LAB — PLATELET COUNT: Platelets: 270 10*3/uL (ref 150–400)

## 2020-04-26 LAB — HEPARIN LEVEL (UNFRACTIONATED)
Heparin Unfractionated: <0.1 IU/mL — ABNORMAL LOW (ref 0.30–0.70)
Heparin Unfractionated: <0.1 IU/mL — ABNORMAL LOW (ref 0.30–0.70)

## 2020-04-26 LAB — GLUCOSE, CAPILLARY: Glucose-Capillary: 97 mg/dL (ref 70–99)

## 2020-04-26 LAB — APTT: aPTT: 38 seconds — ABNORMAL HIGH (ref 24–36)

## 2020-04-26 MED ORDER — DAPAGLIFLOZIN PROPANEDIOL 10 MG PO TABS
10.0000 mg | ORAL_TABLET | Freq: Every day | ORAL | Status: DC
  Administered 2020-04-26 – 2020-05-01 (×6): 10 mg via ORAL
  Filled 2020-04-26 (×6): qty 1

## 2020-04-26 MED ORDER — ENOXAPARIN SODIUM 120 MG/0.8ML ~~LOC~~ SOLN
1.0000 mg/kg | Freq: Two times a day (BID) | SUBCUTANEOUS | Status: DC
  Administered 2020-04-26 (×2): 120 mg via SUBCUTANEOUS
  Filled 2020-04-26 (×4): qty 0.8

## 2020-04-26 MED ORDER — GUAIFENESIN-DM 100-10 MG/5ML PO SYRP
10.0000 mL | ORAL_SOLUTION | ORAL | Status: DC | PRN
  Administered 2020-04-26 – 2020-04-28 (×6): 10 mL via ORAL
  Filled 2020-04-26 (×6): qty 10

## 2020-04-26 MED ORDER — WARFARIN SODIUM 10 MG PO TABS
10.0000 mg | ORAL_TABLET | Freq: Once | ORAL | Status: AC
  Administered 2020-04-26: 10 mg via ORAL
  Filled 2020-04-26: qty 1

## 2020-04-26 MED ORDER — SPIRONOLACTONE 25 MG PO TABS: 25.0000 mg | ORAL_TABLET | Freq: Every day | ORAL | Status: DC

## 2020-04-26 MED ORDER — COUMADIN BOOK
Freq: Once | Status: DC
  Filled 2020-04-26: qty 1

## 2020-04-26 MED ORDER — WARFARIN - PHARMACIST DOSING INPATIENT: Freq: Every day | Status: DC

## 2020-04-26 MED ORDER — SPIRONOLACTONE 12.5 MG HALF TABLET
12.5000 mg | ORAL_TABLET | Freq: Once | ORAL | Status: AC
  Administered 2020-04-26: 12.5 mg via ORAL
  Filled 2020-04-26: qty 1

## 2020-04-26 MED ORDER — SODIUM CHLORIDE 0.9 % IV SOLN
2.0000 g | INTRAVENOUS | Status: DC
  Administered 2020-04-26 – 2020-04-28 (×3): 2 g via INTRAVENOUS
  Filled 2020-04-26: qty 20
  Filled 2020-04-26: qty 2
  Filled 2020-04-26: qty 20
  Filled 2020-04-26 (×2): qty 2

## 2020-04-26 MED ORDER — SPIRONOLACTONE 25 MG PO TABS
25.0000 mg | ORAL_TABLET | Freq: Every day | ORAL | Status: DC
  Administered 2020-04-27 – 2020-05-01 (×5): 25 mg via ORAL
  Filled 2020-04-26 (×5): qty 1

## 2020-04-26 MED ORDER — IOHEXOL 350 MG/ML SOLN
75.0000 mL | Freq: Once | INTRAVENOUS | Status: AC | PRN
  Administered 2020-04-26: 75 mL via INTRAVENOUS

## 2020-04-26 MED ORDER — HEPARIN BOLUS VIA INFUSION
3000.0000 [IU] | Freq: Once | INTRAVENOUS | Status: AC
  Administered 2020-04-26: 3000 [IU] via INTRAVENOUS
  Filled 2020-04-26: qty 3000

## 2020-04-26 NOTE — Progress Notes (Signed)
   Progress Note  Patient Name: Joel [name] Date of Encounter: 04/26/2020  Advanced Heart Failure Cardiologist: Dr. [name]  Called by nursing this evening with update on new onset symptoms.  Patient had gone to the bathroom, sat down on commode, no straining.  He suddenly noticed that his right arm was tingling and he was unable to move it, felt a mild tingling in his right leg as well.  He called for assistance and was taken back to the bed.  Nursing confirmed relative flaccidity of his right arm and a code stroke was initiated.  Patient was evaluated by Dr. Amada Jupiter, note is pending.  He was sent for neuro imaging studies, these results are also pending.  I was apprised of the situation and came to assess the patient at bedside at which point he was starting to feel better, was able to move his right arm although still relatively weak on that side.  No weakness of his right leg.  He felt like his right arm was "asleep."  Complains of mild headache, no visual changes, no nystagmus. He has otherwise been hemodynamically stable.  I reviewed Dr. Alford Highland rounding note from this morning.  Patient remains on milrinone, LVEF 15% with small LV apical mural thrombus, moderate RV dilatation with RVEF 19%.  LGE pattern consistent with myocarditis. He was switched from IV heparin to treatment dose Lovenox this morning.    Obvious concern is for possible embolic event with known LV mural thrombus.  He is already anticoagulated however.  Hopefully, neuro imaging studies will provide more information.  He would not be a candidate for TPA with active anticoagulation, although would certainly defer to neurology if there is a potential neuro interventional option.  Await further direction from Dr. Amada Jupiter.  Symptoms are improving at this time spontaneously.  Signed, Nona Dell, MD  04/26/2020, 7:39 PM

## 2020-04-26 NOTE — Progress Notes (Signed)
ANTICOAGULATION CONSULT NOTE   Pharmacy Consult for heparin Indication: LV thrombus  No Known Allergies  Patient Measurements: Height: 6\' 2"  (188 cm) Weight: 119.7 kg (263 lb 14.3 oz) IBW/kg (Calculated) : 82.2 Heparin Dosing Weight: 108.7 kg  Vital Signs: Temp: 98.1 F (36.7 C) (10/25 2300) Temp Source: Oral (10/25 2300) BP: 133/101 (10/26 0000) Pulse Rate: 102 (10/26 0000)  Labs: Recent Labs    04/24/20 1918 04/24/20 1918 04/25/20 0207 04/25/20 1539 04/25/20 2327  HGB 14.3  --   --   --   --   HCT 44.6  --   --   --   --   PLT 233  --   --   --   --   HEPARINUNFRC <0.10*   < > <0.10* <0.10* <0.10*  CREATININE  --   --  1.78*  --   --    < > = values in this interval not displayed.    Estimated Creatinine Clearance: 89.5 mL/min (A) (by C-G formula based on SCr of 1.78 mg/dL (H)).   Medical History: Past Medical History:  Diagnosis Date  . Anxiety   . Chronic systolic CHF 04/24/2020   Echo at [facility] 04/2020: EF < 20, mild to mod MR, LV thrombus  . Depression   . Dilated cardiomyopathy (HCC)   . History of COVID-19   . Hypertension   . LV (left ventricular) mural thrombus     Medications:  Scheduled:  . Chlorhexidine Gluconate Cloth  6 each Topical Daily  . digoxin  0.125 mg Oral Daily  . doxycycline  100 mg Oral Q12H  . sacubitril-valsartan  1 tablet Oral BID  . sodium chloride flush  10-40 mL Intracatheter Q12H  . sodium chloride flush  3 mL Intravenous Q12H  . spironolactone  12.5 mg Oral Daily    Assessment: 24 yof transferred from [facility] with SOB and hemoptysis for 1 month. ECHO found LVEF<20% and LV thrombus.   Per OSH - heparin 5000 units was started at 1130 on 10/24 then 1250 units/hr. Baseline labs included INR 1.2, Hgb 14.6, plt 280. Total bili 2.4, scr 1.4.   Heparin level tonight came back undetectable, on 1250 units/hr. Hgb 14.3, plt 233. No s/sx of bleeding or infusion issues.   10/26 AM update:  Heparin level  remains low No issues per RN  Goal of Therapy:  Heparin level 0.3-0.7 units/ml Monitor platelets by anticoagulation protocol: Yes   Plan:  Heparin 3000 units re-bolus Increase heparin infusion to 2200 units/hr Re-check heparin level at 0830 Monitor daily HL, CBC, and for s/sx of bleeding  11/26, PharmD, BCPS Clinical Pharmacist Phone: [phone]

## 2020-04-26 NOTE — Progress Notes (Signed)
ANTICOAGULATION CONSULT NOTE   Pharmacy Consult for heparin>>lovenox and warfarin Indication: LV thrombus  No Known Allergies  Patient Measurements: Height: 6\' 2"  (188 cm) Weight: 119.7 kg (263 lb 14.3 oz) IBW/kg (Calculated) : 82.2 Heparin Dosing Weight: 108.7 kg  Vital Signs: Temp: 98.6 F (37 C) (10/26 0400) Temp Source: Oral (10/26 0400) BP: 131/95 (10/26 0700) Pulse Rate: 92 (10/26 0700)  Labs: Recent Labs    04/24/20 1918 04/24/20 1918 04/25/20 0207 04/25/20 1539 04/25/20 2327 04/26/20 0353  HGB 14.3  --   --   --   --  15.2  HCT 44.6  --   --   --   --  47.1  PLT 233  --   --   --   --  248  LABPROT  --   --   --   --   --  15.7*  INR  --   --   --   --   --  1.3*  HEPARINUNFRC <0.10*   < > <0.10* <0.10* <0.10*  --   CREATININE  --   --  1.78*  --   --  1.34*   < > = values in this interval not displayed.    Estimated Creatinine Clearance: 118.9 mL/min (A) (by C-G formula based on SCr of 1.34 mg/dL (H)).   Medical History: Past Medical History:  Diagnosis Date  . Anxiety   . Chronic systolic CHF 04/24/2020   Echo at [facility] 04/2020: EF < 20, mild to mod MR, LV thrombus  . Depression   . Dilated cardiomyopathy (HCC)   . History of COVID-19   . Hypertension   . LV (left ventricular) mural thrombus     Medications:  Scheduled:  . Chlorhexidine Gluconate Cloth  6 each Topical Daily  . digoxin  0.125 mg Oral Daily  . doxycycline  100 mg Oral Q12H  . sacubitril-valsartan  1 tablet Oral BID  . sodium chloride flush  10-40 mL Intracatheter Q12H  . sodium chloride flush  3 mL Intravenous Q12H  . spironolactone  12.5 mg Oral Daily    Assessment: 24 yof transferred from [facility] with SOB and hemoptysis for 1 month. ECHO found LVEF<20% and LV thrombus.   Heparin level has been undetectable since admit, doing a peripheral stick now to verify current rate. Discussed with cardiology and will transition to enoxaparin and warfarin this morning  for more reliable anticoagulation. Scr has improved to within normal limits at 1.3. CBC within normal limits.   Goal of Therapy:  Heparin level 0.3-0.7 units/ml Monitor platelets by anticoagulation protocol: Yes   Plan:  Heparin>>lovenox 120mg  q12 to start at 10am CBC in am then q72 hours if stable Warfarin 10mg  this afternoon Daily INR Will consult TOC as patient says he has no medication insurance Will provide education on HF meds and anticoagulants prior to discharge  [facility] PharmD., BCPS Clinical Pharmacist 04/26/2020 8:06 AM

## 2020-04-26 NOTE — Progress Notes (Addendum)
Patient ID: Joel [name], male   DOB: 1996-02-01, 24 y.o.   MRN: [id number]     Advanced Heart Failure Rounding Note  PCP-Cardiologist: No primary care provider on file.   Subjective:    Yesterday started on milrinone and dig.   CVP:  [6 mmHg-7 mmHg] 6 mmHg   Feels ok. Denies SOB.    CMRI 1.  Possible left basilar PNA. 2.  Small circumferential pericardial effusion. 3. Severe LV dilation with diffuse hypokinesis, EF 15%. There is a small LV apical thrombus. 4.  Moderate RV dilation with EF 19%. 5. LGE pattern concerning for myocarditis, with diffuse patchy mid-wall LGE.  Objective:   Weight Range: 119.7 kg Body mass index is 33.88 kg/m.   Vital Signs:   Temp:  [97.6 F (36.4 C)-98.6 F (37 C)] 98.6 F (37 C) (10/26 0400) Pulse Rate:  [92-105] 92 (10/26 0700) Resp:  [13-43] 31 (10/26 0700) BP: (107-147)/(59-101) 131/95 (10/26 0700) SpO2:  [92 %-100 %] 97 % (10/26 0700) Weight:  [119.7 kg] 119.7 kg (10/26 0400) Last BM Date: 04/24/20  Weight change: Filed Weights   04/24/20 1400 04/25/20 0500 04/26/20 0400  Weight: 122.6 kg 119.7 kg 119.7 kg    Intake/Output:   Intake/Output Summary (Last 24 hours) at 04/26/2020 0732 Last data filed at 04/26/2020 0700 Gross per 24 hour  Intake 906.32 ml  Output 1526 ml  Net -619.68 ml      Physical Exam   CVP 6  General:  Well appearing. No resp difficulty HEENT: normal Neck: supple. no JVD. Carotids 2+ bilat; no bruits. No lymphadenopathy or thryomegaly appreciated. Cor: PMI nondisplaced. Regular rate & rhythm. No rubs, gallops or murmurs. Lungs: clear Abdomen: soft, nontender, nondistended. No hepatosplenomegaly. No bruits or masses. Good bowel sounds. Extremities: no cyanosis, clubbing, rash, edema. RUE PICC Neuro: alert & orientedx3, cranial nerves grossly intact. moves all 4 extremities w/o difficulty. Affect pleasant   Telemetry   NSR 90s personally reviewed.    Labs    CBC Recent Labs     04/24/20 1918 04/26/20 0353  WBC 15.4* 14.5*  HGB 14.3 15.2  HCT 44.6 47.1  MCV 94.1 93.1  PLT 233 248   Basic Metabolic Panel Recent Labs    40/98/11 0207 04/26/20 0353  NA 136 135  K 4.5 4.2  CL 104 104  CO2 21* 23  GLUCOSE 113* 104*  BUN 26* 25*  CREATININE 1.78* 1.34*  CALCIUM 8.3* 8.0*  MG  --  1.8   Liver Function Tests No results for input(s): AST, ALT, ALKPHOS, BILITOT, PROT, ALBUMIN in the last 72 hours. No results for input(s): LIPASE, AMYLASE in the last 72 hours. Cardiac Enzymes No results for input(s): CKTOTAL, CKMB, CKMBINDEX, TROPONINI in the last 72 hours.  BNP: BNP (last 3 results) No results for input(s): BNP in the last 8760 hours.  ProBNP (last 3 results) No results for input(s): PROBNP in the last 8760 hours.   D-Dimer No results for input(s): DDIMER in the last 72 hours. Hemoglobin A1C No results for input(s): HGBA1C in the last 72 hours. Fasting Lipid Panel No results for input(s): CHOL, HDL, LDLCALC, TRIG, CHOLHDL, LDLDIRECT in the last 72 hours. Thyroid Function Tests Recent Labs    04/25/20 0207  TSH 2.890    Other results:   Imaging    MR CARDIAC MORPHOLOGY W WO CONTRAST  Result Date: 04/25/2020 CLINICAL DATA:  Cardiomyopathy of uncertain etiology EXAM: CARDIAC MRI TECHNIQUE: The patient was scanned on a 1.5 Tesla  GE magnet. A dedicated cardiac coil was used. Functional imaging was done using Fiesta sequences. 2,3, and 4 chamber views were done to assess for RWMA's. Modified Simpson's rule using a short axis stack was used to calculate an ejection fraction on a dedicated work [employer]. The patient received 10 cc of Gadavist. After 10 minutes inversion recovery sequences were used to assess for infiltration and scar tissue. CONTRAST:  Gadavist 10 cc FINDINGS: Limited images of the lung fields showed airspace disease left base. Small circumferential pericardial effusion. Severely dilated left ventricle with  normal wall thickness, diffuse hypokinesis with EF 15%. 1.8 x 1.2 LV apical thrombus noted. Moderately dilated right ventricle, EF 19%. Moderately dilated left atrium, mildly dilated right atrium. Trileaflet aortic valve, no stenosis or regurgitation. Probably mild mitral regurgitation. On delayed enhancement imaging, there appears to be patchy mid-wall late gadolinium enhancement (LGE) in the basal to mid septum and lateral wall. Measurements: LVEDV 338 mL LVSV 52 mL LVEF 15% RVEDV 254 mL RVSV 48 mL RVEF 19% IMPRESSION: 1.  Possible left basilar PNA. 2.  Small circumferential pericardial effusion. 3. Severe LV dilation with diffuse hypokinesis, EF 15%. There is a small LV apical thrombus. 4.  Moderate RV dilation with EF 19%. 5. LGE pattern concerning for myocarditis, with diffuse patchy mid-wall LGE. Joel [name] Electronically Signed   By: Joel [name] M.D.   On: 04/25/2020 16:12     Medications:     Scheduled Medications: . Chlorhexidine Gluconate Cloth  6 each Topical Daily  . digoxin  0.125 mg Oral Daily  . doxycycline  100 mg Oral Q12H  . sacubitril-valsartan  1 tablet Oral BID  . sodium chloride flush  10-40 mL Intracatheter Q12H  . sodium chloride flush  3 mL Intravenous Q12H  . spironolactone  12.5 mg Oral Daily    Infusions: . sodium chloride    . cefTRIAXone (ROCEPHIN)  IV Stopped (04/25/20 2052)  . heparin 2,200 Units/hr (04/26/20 0700)  . milrinone 0.125 mcg/kg/min (04/26/20 0700)    PRN Medications: sodium chloride, acetaminophen, ondansetron (ZOFRAN) IV, sodium chloride flush, sodium chloride flush   Assessment/Plan   1. Acute Biventricular HF: Echo at [facility] with EF < 20%, LV thrombus noted.  He has had dyspnea, orthopnea, and pleuritic chest pain since he had COVID-19 in June. He feels like he never improved.  No family history of cardiomyopathy or premature CAD.  Suspect most likely etiology for cardiomyopathy is viral myocarditis due to COVID-19.  HIV negative.    - CMRI LVEF EF 15% RV EF 19%, LGE pattern concerning for myocarditis.  CVP 6-7 today. Volume status stable.  - CO-OX 59%. On milrinone 0.125 mcg.  Continue today and consider stopping tomorrow.    - Continue digoxin 0.125 mg.  - Continue Entresto 24/26 bid.  - Spironolactone 12.5 daily. - Add farxiga 10 mg daily  - Renal function stable.  2. ID: CT chest and CMRI  concerning for multifocal PNA.    COVID-19 negative at [place]. ?Sequelae of prior COVID-19 infection with lung scarring or bacterial PNA.  - Culture blood and sputum, NGTD  - Cover with ceftriaxone/doxycycline for CAP.   3. LV thrombus: On heparin gtt, will eventually need warfarin. Unable to get heparin level. Discussed with pharmacy. Switch to lovenox today and add coumadin.   4. AKI: Creatinine down to 1.3 today.    TOC for entresto and farxiga.  Consult cardiac rehab. Transfer to progressive care.    Length of Stay: 2  Joel Becket, NP  04/26/2020, 7:32 AM  Advanced Heart Failure Team Pager [phone] (M-F; 7a - 4p)  Please contact [facility] for night-coverage after hours (4p -7a ) and weekends on [email or web address]  Patient seen with NP, agree with the above note.   CVP 6-7 today, co-ox 59%.  Milrinone currently going at 0.125.  No dyspnea.   General: NAD Neck: No JVD, no thyromegaly or thyroid nodule.  Lungs: Clear to auscultation bilaterally with normal respiratory effort. CV: Lateral PMI.  Heart regular S1/S2, no S3/S4, no murmur.  No peripheral edema.   Abdomen: Soft, nontender, no hepatosplenomegaly, no distention.  Skin: Intact without lesions or rashes.  Neurologic: Alert and oriented x 3.  Psych: Normal affect. Extremities: No clubbing or cyanosis.  HEENT: Normal.   Cardiac MRI concerning for viral myocarditis (?COVID-19) as cause of cardiomyopathy.  He is not volume overloaded on exam or by CVP.  Co-ox 59%.  - Can hold diuretic today.  - Stop milrinone tomorrow if remains stable.  - Add spironolactone  12.5 daily and Farxiga 10 mg daily.  - Continue Entresto and digoxin.   Covering with ceftriaxone/azithro for possble CAP.   Continue Lovenox bridge to therapeutic warfarin for LV thrombus.   Joel [name] 04/26/2020 3:03 PM

## 2020-04-26 NOTE — Progress Notes (Signed)
CARDIAC REHAB PHASE I   PRE:  Rate/Rhythm: 99 SR  BP:  Supine: 132/94  Sitting:   Standing:    SaO2: 98%RA  MODE:  Ambulation: 740 ft   POST:  Rate/Rhythm: 112 ST  BP:  Supine: 131/89  Sitting:   Standing:    SaO2: 98%RA 1355-1450 Gave pt and family CHF booklet and low sodium diets. Discussed signs/symptoms of CHF and when to call MD with weight gain. Encouraged daily weights and low sodium foods/2000 mg restriction. Encouraged 2LFR. Encouraged pt not to chew tobacco. Pt able to walk 740 ft on RA with steady gait and converse. No DOE noted. Pt voiced understanding of ed. Back to bed. Tolerated well.   Luetta Nutting, RN BSN  04/26/2020 2:45 PM

## 2020-04-26 NOTE — H&P (Addendum)
Neurology H&P  CC: acute onset right upper extremity weakness  History is obtained from: patient and chart  HPI: Joel [name] is a 24 y.o. male recent COVID-19 infection developed viral myocarditis and acute systolic CHF and known  LV thrombus switched to LMWH last night noted to have acute onset right arm flaccid paralysis and right leg weakness.  No speech difficulty, visual changes  Also c/o SOB, fatigue  LKW: 1845 noted to have symptoms while in bathroom. tpa given?: No, received full dose LMWH within 24 hours. IR Thrombectomy? No indication Modified Rankin Scale: 0-Completely asymptomatic NIHSS: 7   ROS: A complete ROS was performed and is negative except as noted in the HPI.    Past Medical History:  Diagnosis Date  . Anxiety   . Chronic systolic CHF 04/24/2020   Echo at [facility] 04/2020: EF < 20, mild to mod MR, LV thrombus  . Depression   . Dilated cardiomyopathy (HCC)   . History of COVID-19   . Hypertension   . LV (left ventricular) mural thrombus    PSH  Tonsillectomy. Toe suregery  Family History  Problem Relation Age of Onset  . Heart failure Neg Hx    Family Hx  Family history of cerebrovascular accident: Father (V17.1) (Z82.3) Family history of diabetes mellitus: Mother (V18.0) (Z83.3).  Social History:  reports that he has quit smoking. His smoking use included e-cigarettes. His smokeless tobacco use includes chew. He reports previous alcohol use. He reports that he does not use drugs.  Prior to Admission medications   Medication Sig Start Date End Date Taking? Authorizing Provider  busPIRone (BUSPAR) 15 MG tablet Take 7.5 mg by mouth 2 (two) times daily. 04/19/20  Yes [provider]  escitalopram (LEXAPRO) 10 MG tablet Take 10 mg by mouth daily. 04/07/20  Yes [provider]  FLOVENT HFA 110 MCG/ACT inhaler Inhale 2 puffs into the lungs 2 (two) times daily. 04/19/20  Yes [provider]  hydrOXYzine  (ATARAX/VISTARIL) 50 MG tablet Take 100 mg by mouth 3 (three) times daily as needed. 04/21/20  Yes [provider]  montelukast (SINGULAIR) 10 MG tablet Take 10 mg by mouth at bedtime. 04/04/20  Yes [provider]  PROAIR HFA 108 (90 Base) MCG/ACT inhaler Inhale 2 puffs into the lungs every 6 (six) hours as needed for shortness of breath. 04/04/20  Yes [provider]   Exam: Current vital signs: BP (!) 134/95 (BP Location: Left Arm)   Pulse 93   Temp 97.9 F (36.6 C) (Oral)   Resp 18   Ht 6\' 2"  (1.88 m)   Wt 119.7 kg   SpO2 99%   BMI 33.88 kg/m    Physical Exam  Constitutional: Appears well-developed and well-nourished.  Psych: Affect appropriate to situation Eyes: No scleral injection HENT: No OP obstrucion Head: Normocephalic.  Cardiovascular: Normal rate and regular rhythm.  Respiratory: Effort normal and breath sounds normal to anterior ascultation GI: Soft.  No distension. There is no tenderness.  Skin: WDI  Neuro: Mental Status: Patient is awake, alert, oriented to person, place, month, year, and situation. Patient is able to give a clear and coherent history. No signs of aphasia or neglect Cranial Nerves: II: Visual Fields are full. Pupils are equal, round, and reactive to light.   III,IV, VI: EOMI without ptosis or diploplia.  V: Facial sensation is symmetric to temperature VII: Facial movement is symmetric.  VIII: hearing is intact to voice X: Uvula elevates symmetrically XI: Shoulder  shrug is symmetric. XII: tongue is midline without atrophy or fasciculations.  Motor: Tone is normal. Bulk is normal. Right upper extremity flaccid, right lower extremity with drift ~3/4. Sensory: Sensation is symmetric to light touch and temperature decreased in right upper >> lower extremities Deep Tendon Reflexes: 2+ and symmetric in the biceps and patellae.  Plantars: Toe up-going on right.  Cerebellar: FNF and HKS are intact on left.  I have  reviewed the images obtained: NCT head was normal and ASPECTS 10 CTA head and neck showed no large vessel occlusion.  Primary Diagnosis:  Cerebral infarction due to thrombosis of left middle cerebral artery.   Secondary Diagnosis: Known cardiac LV thrombus in setting of CHF  Assessment: 24 year old man with  COVID-19 associated viral myocarditis, acute systolic CHF and known LV thrombus with acute right arm flaccid paralysis. The patient was not a candidate for tPA due to having received LMWH within 24 hours. He has known LV thrombus and systolic CHF/EF~20% significantly increasing the risk for further devastating stroke and will need to continue anticoagulation.  Impression:  Acute embolic stroke left MCA territory. Right arm >> leg weakness and numbness. Cardiac LV thrombus. CHF.  Plan: - Brain MRI  - Recommend close monitoring of neurologic exam. - Cardiology is following patient. - Recommend ordering Lipid panel - Recommend Statin if LDL > 70 - Recommend obtaining HbA1c - Continue anticoagulation per cardiology. - SBP goal - permissive hypertension first 24 h < 220/110. Hold home meds.  - Telemetry monitoring for arrhythmia.  - Recommend bedside Swallow screen - Recommend Stroke education. - Recommend PT/OT/SLP consult.   Electronically signed by: Dr. Marisue Humble Pager: [phone] 04/26/2020, 7:21 PM

## 2020-04-26 NOTE — Progress Notes (Signed)
6:30 Pt called for assistance to go to the bathroom for a bowel movement.  6:45 Pt called from the bathroom after having a bowel movement c/p of right arm flaccid and minimal movement in his right leg. "I can't move or feel my right arm and leg". Full sensation in the left arm/leg. Code stroke initiated. Rapid response notified and pt was evaluated by Dr. Amada Jupiter at bedside. Rapid nurse, Dr.Kirkpatrick, NT and RN transported  pt to neuro imaging studies.  7:30. Pt transported back to his room. Dr. Diona Browner page about the event. Stated that his will notified Dr.Mclean.  Pt is starting to get sensation back in his right arm and leg. Oncoming nurse notified about the event.

## 2020-04-26 NOTE — Plan of Care (Signed)
  Problem: Education: Goal: Knowledge of General Education information will improve Description: Including pain rating scale, medication(s)/side effects and non-pharmacologic comfort measures Outcome: Progressing   Problem: Health Behavior/Discharge Planning: Goal: Ability to manage health-related needs will improve Outcome: Progressing   Problem: Clinical Measurements: Goal: Ability to maintain clinical measurements within normal limits will improve Outcome: Progressing   Problem: Clinical Measurements: Goal: Diagnostic test results will improve Outcome: Progressing   Problem: Coping: Goal: Level of anxiety will decrease Outcome: Progressing   Problem: Pain Managment: Goal: General experience of comfort will improve Outcome: Progressing   Problem: Education: Goal: Ability to verbalize understanding of medication therapies will improve Outcome: Progressing   Problem: Education: Goal: Ability to demonstrate management of disease process will improve Outcome: Progressing

## 2020-04-26 NOTE — Significant Event (Signed)
Rapid Response Event Note   Reason for Call :  Patient walked to the bathroom.  While he was having a bowel movement he suddenly couldn't move his right arm and leg.    Initial Focused Assessment:  Assisted to the bed. Placed flat. He is able to lift his right leg but his right arm is flaccid. NIHSS  6   He is alert and oriented, anxious about what happened and why he can't move his right arm.   Interventions:  Stat Head CT CTA head and neck Dr Amada Jupiter and Dr Thomasena Edis at bedside to assess patient. [facility] swallow screen done:  passed  Plan of Care:  VS and neuro checks q 2hours   Event Summary:   MD Notified: Dr Amada Jupiter, Heart failure team Call Time: 1845 Arrival Time: 1848 End Time:  1920  Marcellina Millin, RN

## 2020-04-26 NOTE — Plan of Care (Signed)
  Problem: Education: Goal: Knowledge of General Education information will improve Description: Including pain rating scale, medication(s)/side effects and non-pharmacologic comfort measures Outcome: Progressing   Problem: Health Behavior/Discharge Planning: Goal: Ability to manage health-related needs will improve Outcome: Progressing   Problem: Clinical Measurements: Goal: Ability to maintain clinical measurements within normal limits will improve Outcome: Progressing Goal: Will remain free from infection Outcome: Progressing Goal: Diagnostic test results will improve Outcome: Progressing Goal: Respiratory complications will improve Outcome: Progressing Goal: Cardiovascular complication will be avoided Outcome: Progressing   Problem: Activity: Goal: Risk for activity intolerance will decrease Outcome: Progressing   Problem: Nutrition: Goal: Adequate nutrition will be maintained Outcome: Progressing   Problem: Coping: Goal: Level of anxiety will decrease Outcome: Progressing   Problem: Elimination: Goal: Will not experience complications related to bowel motility Outcome: Progressing Goal: Will not experience complications related to urinary retention Outcome: Progressing   Problem: Pain Managment: Goal: General experience of comfort will improve Outcome: Progressing   Problem: Safety: Goal: Ability to remain free from injury will improve Outcome: Progressing   Problem: Skin Integrity: Goal: Risk for impaired skin integrity will decrease Outcome: Progressing   Problem: Education: Goal: Ability to demonstrate management of disease process will improve Outcome: Progressing Goal: Ability to verbalize understanding of medication therapies will improve Outcome: Progressing Goal: Individualized Educational Video(s) Outcome: Progressing   Problem: Activity: Goal: Capacity to carry out activities will improve Outcome: Progressing   Problem: Cardiac: Goal:  Ability to achieve and maintain adequate cardiopulmonary perfusion will improve Outcome: Progressing   Problem: Activity: Goal: Ability to tolerate increased activity will improve Outcome: Progressing   Problem: Clinical Measurements: Goal: Ability to maintain a body temperature in the normal range will improve Outcome: Progressing   Problem: Respiratory: Goal: Ability to maintain adequate ventilation will improve Outcome: Progressing Goal: Ability to maintain a clear airway will improve Outcome: Progressing   

## 2020-04-26 NOTE — Progress Notes (Signed)
Report called to receiving unit RN - VSS. NAD. Family at bedside, transferred off unit via [facility] by SWAT RN, all belongings w/pt family transferred w/pt to receiving unit.

## 2020-04-27 ENCOUNTER — Inpatient Hospital Stay (HOSPITAL_COMMUNITY): Payer: Self-pay

## 2020-04-27 DIAGNOSIS — I420 Dilated cardiomyopathy: Secondary | ICD-10-CM

## 2020-04-27 DIAGNOSIS — I639 Cerebral infarction, unspecified: Secondary | ICD-10-CM

## 2020-04-27 LAB — CULTURE, RESPIRATORY W GRAM STAIN
Culture: NORMAL
Gram Stain: NONE SEEN

## 2020-04-27 LAB — COOXEMETRY PANEL
Carboxyhemoglobin: 0.9 % (ref 0.5–1.5)
Methemoglobin: 0.6 % (ref 0.0–1.5)
O2 Saturation: 59.1 %
Total hemoglobin: 15.1 g/dL (ref 12.0–16.0)

## 2020-04-27 LAB — GLUCOSE, CAPILLARY: Glucose-Capillary: 89 mg/dL (ref 70–99)

## 2020-04-27 LAB — CBC
HCT: 44.7 % (ref 39.0–52.0)
Hemoglobin: 14.5 g/dL (ref 13.0–17.0)
MCH: 29.4 pg (ref 26.0–34.0)
MCHC: 32.4 g/dL (ref 30.0–36.0)
MCV: 90.7 fL (ref 80.0–100.0)
Platelets: 228 10*3/uL (ref 150–400)
RBC: 4.93 MIL/uL (ref 4.22–5.81)
RDW: 14.1 % (ref 11.5–15.5)
WBC: 8.2 10*3/uL (ref 4.0–10.5)
nRBC: 0 % (ref 0.0–0.2)

## 2020-04-27 LAB — PROTIME-INR
INR: 1.1 (ref 0.8–1.2)
Prothrombin Time: 14 seconds (ref 11.4–15.2)

## 2020-04-27 LAB — BASIC METABOLIC PANEL
Anion gap: 8 (ref 5–15)
BUN: 15 mg/dL (ref 6–20)
CO2: 23 mmol/L (ref 22–32)
Calcium: 8 mg/dL — ABNORMAL LOW (ref 8.9–10.3)
Chloride: 103 mmol/L (ref 98–111)
Creatinine, Ser: 1.08 mg/dL (ref 0.61–1.24)
GFR, Estimated: >60 mL/min (ref >60)
Glucose, Bld: 188 mg/dL — ABNORMAL HIGH (ref 70–99)
Potassium: 4 mmol/L (ref 3.5–5.1)
Sodium: 134 mmol/L — ABNORMAL LOW (ref 135–145)

## 2020-04-27 LAB — HEPARIN LEVEL (UNFRACTIONATED)
Heparin Unfractionated: >2.2 IU/mL — ABNORMAL HIGH (ref 0.30–0.70)
Heparin Unfractionated: 0.47 IU/mL (ref 0.30–0.70)

## 2020-04-27 LAB — HEPARIN ANTI-XA: Heparin LMW: 0.23 IU/mL

## 2020-04-27 MED ORDER — WARFARIN SODIUM 10 MG PO TABS
10.0000 mg | ORAL_TABLET | Freq: Once | ORAL | Status: AC
  Administered 2020-04-27: 10 mg via ORAL
  Filled 2020-04-27: qty 1

## 2020-04-27 MED ORDER — HEPARIN (PORCINE) 25000 UT/250ML-% IV SOLN
2550.0000 [IU]/h | INTRAVENOUS | Status: DC
  Administered 2020-04-27: 2600 [IU]/h via INTRAVENOUS
  Administered 2020-04-28: 2550 [IU]/h via INTRAVENOUS
  Administered 2020-04-28: 2600 [IU]/h via INTRAVENOUS
  Administered 2020-04-29 – 2020-04-30 (×4): 2550 [IU]/h via INTRAVENOUS
  Filled 2020-04-27 (×8): qty 250

## 2020-04-27 MED ORDER — FUROSEMIDE 10 MG/ML IJ SOLN
40.0000 mg | Freq: Two times a day (BID) | INTRAMUSCULAR | Status: DC
  Administered 2020-04-27 – 2020-04-28 (×3): 40 mg via INTRAVENOUS
  Filled 2020-04-27 (×3): qty 4

## 2020-04-27 MED ORDER — STROKE: EARLY STAGES OF RECOVERY BOOK
Freq: Once | Status: AC
  Filled 2020-04-27: qty 1

## 2020-04-27 MED ORDER — DOCUSATE SODIUM 100 MG PO CAPS
100.0000 mg | ORAL_CAPSULE | Freq: Every day | ORAL | Status: DC
  Administered 2020-04-27 – 2020-05-01 (×5): 100 mg via ORAL
  Filled 2020-04-27 (×5): qty 1

## 2020-04-27 MED ORDER — POTASSIUM CHLORIDE CRYS ER 20 MEQ PO TBCR
20.0000 meq | EXTENDED_RELEASE_TABLET | Freq: Two times a day (BID) | ORAL | Status: DC
  Administered 2020-04-27 – 2020-04-29 (×5): 20 meq via ORAL
  Filled 2020-04-27 (×5): qty 1

## 2020-04-27 NOTE — Progress Notes (Addendum)
Subjective: Called as a code stroke due to recurrent   Exam: Vitals:   04/27/20 0600 04/27/20 0645  BP: (!) 134/97 (!) 134/97  Pulse: 93 98  Resp: 17 20  Temp: 98.1 F (36.7 C) 98.1 F (36.7 C)  SpO2: 95% 95%   Gen: In bed, NAD Resp: non-labored breathing, no acute distress Abd: soft, nt  Neuro: MS: awake, alert, interactive and appropriate DV:VOHYW, EOMI, VFF Motor: 5/5 throughout, no drift Sensory: diminished LT in the left face and arm.  Cerebellar: intact FNF on the left, mildly slow without dysmetria on the right.   Pertinent Labs: Na 134  Impression: 24 yo M with recurrent embolic events in the setting of cardiomyopathy presumed 2/2 previous COVID infection. He does have a small area of infarct on CT, but given his recurrent events despite anticoagulation, I feel that he is at high risk for recurrent embolism. In this setting, though there is some risk of hemorrhagic conversion, I think that this risk of recurrent embolism outweighs the risk of hemorrhage. I would favor using heparin if possible in this setting as it can be quickly reversed in the setting of hemorrhage.   Recommendations: 1) Continue anticoagulation with heparin per pharmacy.  2) Given that both events happened in the setting of bowel movements, will start docusate to reduce strain 3) MRI brain 4) PT,OT,ST  This patient is critically ill and at significant risk of neurological worsening, death and care requires constant monitoring of vital signs, hemodynamics,respiratory and cardiac monitoring, neurological assessment, discussion with family, other specialists and medical decision making of high complexity. I spent 35 minutes of neurocritical care time  in the care of  this patient. This was time spent independent of any time provided by nurse practitioner or PA.  Ritta Slot, MD [facility] [phone]  If 7pm- 7am, please page neurology on call as listed in AMION. 04/27/2020   9:05 AM

## 2020-04-27 NOTE — Evaluation (Signed)
Physical Therapy Evaluation Patient Details Name: Joel [name] MRN: [id number] DOB: [birth year] Today's Date: 04/27/2020   History of Present Illness  24 y.o. male recent COVID-19 infection developed viral myocarditis and acute systolic CHF and known  LV thrombus switched to LMWH 04/26/20 noted to have acute onset right arm flaccid paralysis and right leg weakness which resolved. 04/27/20 had additional bout of tingling in L upper arm  Clinical Impression  PTA pt living with grandparents and sister in single story home with one step to enter. Pt completely independent. Pt working with Cardiac Rehab earlier in the week and able to ambulate 700 feet without assist. S/p 2x episodes of weakness and tingling pt has slight proximal R UE weakness in comparison to L UE, exhibits L sided shoulder hike at rest and impaired sensation on L cervical region and L cheek. Pt is independent in mobility, however exhibits some higher level balance deficits that the Cardiac Rehab specialist states was not there earlier in the week. PT will perform DGI in next session. Pt will not have any PT or equipment needs at discharge. PT will continue to follow acutely.    Follow Up Recommendations No PT follow up    Equipment Recommendations  None recommended by PT       Precautions / Restrictions Precautions Precautions: None Restrictions Weight Bearing Restrictions: No      Mobility  Bed Mobility Overal bed mobility: Independent                  Transfers Overall transfer level: Independent                  Ambulation/Gait Ambulation/Gait assistance: Independent Gait Distance (Feet): 340 Feet Assistive device: None Gait Pattern/deviations: Step-through pattern;WFL(Within Functional Limits)     General Gait Details: slightly, unsteady gait no overt LoB         Balance Overall balance assessment: Modified Independent                               Standardized  Balance Assessment Standardized Balance Assessment : Dynamic Gait Index   Dynamic Gait Index Level Surface: Normal Change in Gait Speed: Normal Gait with Horizontal Head Turns: Mild Impairment Gait with Vertical Head Turns: Mild Impairment Gait and Pivot Turn: Moderate Impairment Step Over Obstacle: Mild Impairment       Pertinent Vitals/Pain Pain Assessment: No/denies pain    Home Living Family/patient expects to be discharged to:: Private residence Living Arrangements: Other relatives [place], [place], sister) Available Help at Discharge: Family;Available 24 hours/day Type of Home: House Home Access: Level entry     Home Layout: One level Home Equipment: Bedside commode;Walker - 2 wheels;Cane - single point      Prior Function Level of Independence: Independent         Comments: drive     Hand Dominance   Dominant Hand: Right    Extremity/Trunk Assessment   Upper Extremity Assessment Upper Extremity Assessment: LUE deficits/detail;RUE deficits/detail RUE Deficits / Details: ROM full, strength slightly less than R UE especially proximally  RUE Coordination: WNL LUE Deficits / Details: ROM full and strength WFL, noted to have L shoulder hike in resting, able to relax but quickly returns, also reports tingling sensation on L cervical region towards L cheek LUE Sensation:  (altered sensation L neck and cheek) LUE Coordination: WNL    Lower Extremity Assessment Lower Extremity Assessment: RLE deficits/detail;LLE deficits/detail RLE Deficits /  Details: ROM and strength WNL RLE Sensation: WNL RLE Coordination: WNL LLE Deficits / Details: ROM and strength WNL LLE Sensation: WNL LLE Coordination: WNL    Cervical / Trunk Assessment Cervical / Trunk Assessment: Other exceptions Cervical / Trunk Exceptions: L shoulder hike and altered sensation L cervical region and L cheek  Communication   Communication: No difficulties  Cognition Arousal/Alertness:  Awake/alert Behavior During Therapy: WFL for tasks assessed/performed Overall Cognitive Status: Within Functional Limits for tasks assessed                                        General Comments General comments (skin integrity, edema, etc.): sitting BP 134/93 HR 98, max noted HR with ambulation 118bpm, after ambulation 142/109 HR109, after 3 min sitting 141/102 HR 104         Assessment/Plan    PT Assessment Patient needs continued PT services  PT Problem List Decreased balance;Decreased coordination       PT Treatment Interventions DME instruction;Gait training;Functional mobility training;Therapeutic activities;Therapeutic exercise;Balance training;Cognitive remediation;Patient/family education    PT Goals (Current goals can be found in the Care Plan section)  Acute Rehab PT Goals Patient Stated Goal: go hunting PT Goal Formulation: With patient Time For Goal Achievement: 05/11/20 Potential to Achieve Goals: Good    Frequency Min 3X/week    AM-PAC PT "6 Clicks" Mobility  Outcome Measure Help needed turning from your back to your side while in a flat bed without using bedrails?: None Help needed moving from lying on your back to sitting on the side of a flat bed without using bedrails?: None Help needed moving to and from a bed to a chair (including a wheelchair)?: None Help needed standing up from a chair using your arms (e.g., wheelchair or bedside chair)?: None Help needed to walk in hospital room?: None Help needed climbing 3-5 steps with a railing? : None 6 Click Score: 24    End of Session Equipment Utilized During Treatment: Gait belt Activity Tolerance: Patient tolerated treatment well Patient left: in bed;with call bell/phone within reach;with family/visitor present Nurse Communication: Mobility status PT Visit Diagnosis: Other abnormalities of gait and mobility (R26.89);Other symptoms and signs involving the nervous system (R29.898)     Time: 1335-1411 PT Time Calculation (min) (ACUTE ONLY): 36 min   Charges:   PT Evaluation $PT Eval Moderate Complexity: 1 Mod PT Treatments $Gait Training: 8-22 mins        Joel [name] PT, DPT Acute Rehabilitation Services Pager [phone] Office [phone]   Joel [name] 04/27/2020, 2:29 PM

## 2020-04-27 NOTE — Progress Notes (Signed)
ANTICOAGULATION CONSULT NOTE   Pharmacy Consult for lovenox>heparin and warfarin Indication: LV thrombus  No Known Allergies  Patient Measurements: Height: 6\' 2"  (188 cm) Weight: 127.4 kg (280 lb 13.9 oz) IBW/kg (Calculated) : 82.2 Heparin Dosing Weight: 108.7 kg  Vital Signs: Temp: 98.3 F (36.8 C) (10/27 1600) Temp Source: Oral (10/27 1600) BP: 142/101 (10/27 1700) Pulse Rate: 104 (10/27 1700)  Labs: Recent Labs    04/25/20 0207 04/25/20 1539 04/25/20 2327 04/26/20 0353 04/26/20 0745 04/27/20 0400 04/27/20 0930 04/27/20 1600 04/27/20 1822  HGB  --   --    < > 15.2 15.2 14.5  --   --   --   HCT  --   --   --  47.1 46.2 44.7  --   --   --   PLT  --   --   --  248 270 228  --   --   --   APTT  --   --   --   --  38*  --   --   --   --   LABPROT  --   --   --  15.7* 14.1 14.0  --   --   --   INR  --   --   --  1.3* 1.1 1.1  --   --   --   HEPARINUNFRC <0.10*   < >   < >  --  <0.10*  --   --  >2.20* 0.47  HEPRLOWMOCWT  --   --   --   --   --   --  0.23  --   --   CREATININE 1.78*  --   --  1.34*  --  1.08  --   --   --    < > = values in this interval not displayed.    Estimated Creatinine Clearance: 152.2 mL/min (by C-G formula based on SCr of 1.08 mg/dL).   Medical History: Past Medical History:  Diagnosis Date  . Anxiety   . Chronic systolic CHF 04/24/2020   Echo at [facility] 04/2020: EF < 20, mild to mod MR, LV thrombus  . Depression   . Dilated cardiomyopathy (HCC)   . History of COVID-19   . Hypertension   . LV (left ventricular) mural thrombus     Medications:  Scheduled:  . Chlorhexidine Gluconate Cloth  6 each Topical Daily  . coumadin book   Does not apply Once  . dapagliflozin propanediol  10 mg Oral Daily  . digoxin  0.125 mg Oral Daily  . docusate sodium  100 mg Oral Daily  . doxycycline  100 mg Oral Q12H  . furosemide  40 mg Intravenous BID  . potassium chloride  20 mEq Oral BID  . sacubitril-valsartan  1 tablet Oral BID  . sodium  chloride flush  10-40 mL Intracatheter Q12H  . sodium chloride flush  3 mL Intravenous Q12H  . spironolactone  25 mg Oral Daily  . Warfarin - Pharmacist Dosing Inpatient   Does not apply q1600    Assessment: 24 yof transferred from [facility] with SOB and hemoptysis for 1 month. ECHO found LVEF<20% and LV thrombus.   Heparin level had been undetectable since admit, peripheral stick verified undetectable yesterday patient then switched to lovenox. He received two doses yesterday. Stroke/TIA last night involving right side then again a code stroke was called this morning, small infarct noted on CT. High risk for recurrent embolism, which outweighs  risk of hemorrhage. Detailed discussion with neurology/cardiology and given risk of hemorrhagic transformation will transition back to IV heparin and will plan on being more aggressive. Anti-xa level was 0.23 about 12 hours after dose of lovenox, this would indicate adequate anticoagulation in most settings, patient may benefit from less peaks and troughs in anticoagulation while warfarin is being loaded.   Neurology did indicate pharmacy could give small boluses if heparin levels aren't trending up quickly enough.   10/27 PM follow up  > heparin level initially reported as > 2.2, was drawn from line where heparin was recently infusing.  Rechecked via peripheral stick, and level at goal.  No overt bleeding or complications noted.    Goal of Therapy:  INR goal 2-3 Heparin level 0.3-0.7 units/ml Monitor platelets by anticoagulation protocol: Yes   Plan:  Continue IV heparin at current rate, recheck with AM labs. Warfarin 10mg  again this afternoon Daily INR Will consult TOC as patient says he has no medication insurance Will provide education on HF meds and anticoagulants prior to discharge  , Reece Leader, BCCP Clinical Pharmacist  04/27/2020 7:22 PM   [facility] pharmacy phone numbers are listed on [email or web address]

## 2020-04-27 NOTE — Plan of Care (Signed)
Problem: Education: Goal: Knowledge of General Education information will improve Description: Including pain rating scale, medication(s)/side effects and non-pharmacologic comfort measures 04/27/2020 1623 by Sharol Given, RN Outcome: Progressing 04/27/2020 1620 by Sharol Given, RN Outcome: Progressing   Problem: Health Behavior/Discharge Planning: Goal: Ability to manage health-related needs will improve 04/27/2020 1623 by Sharol Given, RN Outcome: Progressing 04/27/2020 1620 by Sharol Given, RN Outcome: Progressing   Problem: Clinical Measurements: Goal: Ability to maintain clinical measurements within normal limits will improve 04/27/2020 1623 by Sharol Given, RN Outcome: Progressing 04/27/2020 1620 by Sharol Given, RN Outcome: Progressing Goal: Will remain free from infection 04/27/2020 1623 by Sharol Given, RN Outcome: Progressing 04/27/2020 1620 by Sharol Given, RN Outcome: Progressing Goal: Diagnostic test results will improve 04/27/2020 1623 by Sharol Given, RN Outcome: Progressing 04/27/2020 1620 by Sharol Given, RN Outcome: Progressing Goal: Respiratory complications will improve 04/27/2020 1623 by Sharol Given, RN Outcome: Progressing 04/27/2020 1620 by Sharol Given, RN Outcome: Progressing Goal: Cardiovascular complication will be avoided 04/27/2020 1623 by Sharol Given, RN Outcome: Progressing 04/27/2020 1620 by Sharol Given, RN Outcome: Progressing   Problem: Activity: Goal: Risk for activity intolerance will decrease 04/27/2020 1623 by Sharol Given, RN Outcome: Progressing 04/27/2020 1620 by Sharol Given, RN Outcome: Progressing   Problem: Nutrition: Goal: Adequate nutrition will be maintained 04/27/2020 1623 by Sharol Given, RN Outcome: Progressing 04/27/2020 1620 by Sharol Given, RN Outcome: Progressing   Problem: Coping: Goal: Level of anxiety will decrease 04/27/2020  1623 by Sharol Given, RN Outcome: Progressing 04/27/2020 1620 by Sharol Given, RN Outcome: Progressing   Problem: Elimination: Goal: Will not experience complications related to bowel motility 04/27/2020 1623 by Sharol Given, RN Outcome: Progressing 04/27/2020 1620 by Sharol Given, RN Outcome: Progressing Goal: Will not experience complications related to urinary retention 04/27/2020 1623 by Sharol Given, RN Outcome: Progressing 04/27/2020 1620 by Sharol Given, RN Outcome: Progressing   Problem: Pain Managment: Goal: General experience of comfort will improve 04/27/2020 1623 by Sharol Given, RN Outcome: Progressing 04/27/2020 1620 by Sharol Given, RN Outcome: Progressing   Problem: Safety: Goal: Ability to remain free from injury will improve 04/27/2020 1623 by Sharol Given, RN Outcome: Progressing 04/27/2020 1620 by Sharol Given, RN Outcome: Progressing   Problem: Skin Integrity: Goal: Risk for impaired skin integrity will decrease 04/27/2020 1623 by Sharol Given, RN Outcome: Progressing 04/27/2020 1620 by Sharol Given, RN Outcome: Progressing   Problem: Education: Goal: Ability to demonstrate management of disease process will improve 04/27/2020 1623 by Sharol Given, RN Outcome: Progressing 04/27/2020 1620 by Sharol Given, RN Outcome: Progressing Goal: Ability to verbalize understanding of medication therapies will improve 04/27/2020 1623 by Sharol Given, RN Outcome: Progressing 04/27/2020 1620 by Sharol Given, RN Outcome: Progressing Goal: Individualized Educational Video(s) 04/27/2020 1623 by Sharol Given, RN Outcome: Progressing 04/27/2020 1620 by Sharol Given, RN Outcome: Progressing   Problem: Activity: Goal: Capacity to carry out activities will improve 04/27/2020 1623 by Sharol Given, RN Outcome: Progressing 04/27/2020 1620 by Sharol Given, RN Outcome: Progressing    Problem: Cardiac: Goal: Ability to achieve and maintain adequate cardiopulmonary perfusion will improve 04/27/2020 1623 by Sharol Given, RN Outcome: Progressing 04/27/2020 1620 by Sharol Given, RN Outcome: Progressing   Problem: Activity: Goal: Ability to tolerate increased activity will improve 04/27/2020 1623 by Sharol Given,  RN Outcome: Progressing 04/27/2020 1620 by Sharol Given, RN Outcome: Progressing   Problem: Clinical Measurements: Goal: Ability to maintain a body temperature in the normal range will improve 04/27/2020 1623 by Sharol Given, RN Outcome: Progressing 04/27/2020 1620 by Sharol Given, RN Outcome: Progressing   Problem: Respiratory: Goal: Ability to maintain adequate ventilation will improve 04/27/2020 1623 by Sharol Given, RN Outcome: Progressing 04/27/2020 1620 by Sharol Given, RN Outcome: Progressing Goal: Ability to maintain a clear airway will improve 04/27/2020 1623 by Sharol Given, RN Outcome: Progressing 04/27/2020 1620 by Sharol Given, RN Outcome: Progressing   Problem: Education: Goal: Knowledge of disease or condition will improve Outcome: Progressing Goal: Knowledge of secondary prevention will improve Outcome: Progressing Goal: Knowledge of patient specific risk factors addressed and post discharge goals established will improve Outcome: Progressing   Problem: Coping: Goal: Will verbalize positive feelings about self Outcome: Progressing Goal: Will identify appropriate support needs Outcome: Progressing   Problem: Health Behavior/Discharge Planning: Goal: Ability to manage health-related needs will improve Outcome: Progressing

## 2020-04-27 NOTE — Progress Notes (Signed)
Patient states feels today he has been having intermittent difficulty finding his words all day. While eating at lunch and at supper, patient states he feels that his right arm is a bit heavy.  This is not a new change from this morning.

## 2020-04-27 NOTE — Progress Notes (Signed)
   04/27/20 0808  Assess: MEWS Score  BP (!) 135/96  Pulse Rate (!) 103  ECG Heart Rate (!) 103  Resp (!) 23  SpO2 98 %  Assess: MEWS Score  MEWS Temp 0  MEWS Systolic 0  MEWS Pulse 1  MEWS RR 1  MEWS LOC 0  MEWS Score 2  MEWS Score Color Yellow  Assess: if the MEWS score is Yellow or Red  Were vital signs taken at a resting state? No  Focused Assessment Change from prior assessment (see assessment flowsheet)  Early Detection of Sepsis Score *See Row Information* Low  MEWS guidelines implemented *See Row Information* No, other (Comment)  Treat  MEWS Interventions Escalated (See documentation below)  Pain Scale 0-10  Pain Score 0  Take Vital Signs  Increase Vital Sign Frequency  Yellow: Q 2hr X 2 then Q 4hr X 2, if remains yellow, continue Q 4hrs  Escalate  MEWS: Escalate Red: discuss with charge nurse/RN and provider, consider discussing with RRT  Notify: Charge Nurse/RN  Name of Charge Nurse/RN Notified Cheshenda  Date Charge Nurse/RN Notified 04/27/20  Time Charge Nurse/RN Notified [id number]  Notify: Provider  Provider Name/Title Amy Clegg  Date Provider Notified 04/27/20  Time Provider Notified [phone]  Notification Type Page  Notification Reason Change in status (Calling Code Stroke)  Response See new orders  Date of Provider Response 04/27/20  Time of Provider Response 0815  Notify: Rapid Response  Name of Rapid Response RN Notified Shelby  Date Rapid Response Notified 04/27/20  Time Rapid Response Notified 0816  Document  Patient Outcome Stabilized after interventions (Patient to CT for cat scan, no bleed noted)  Progress note created (see row info) Yes

## 2020-04-27 NOTE — Progress Notes (Signed)
  Speech Language Pathology Treatment: Cognitive-Linquistic  Patient Details Name: EDWARDO WOJNAROWSKI MRN: [id number] DOB: [birth year] Today's Date: 04/27/2020 Time: 5374-8270 SLP Time Calculation (min) (ACUTE ONLY): 14 min  Assessment / Plan / Recommendation Clinical Impression  Pt was seen for treatment with his sister present intermittently. Pt was educated regarding his performance on the evaluation and the potential impact of the noted impairments. He verbalized understanding but stated that he was very anxious during the evaluation and his sister agreed. Pt participated in conversation with intermittent word retrieval difficulty and need for additional processing time. He benefited from repetition of more complex information and questions. He demonstrated 75% accuracy with a medication management (prescription) task increasing to 100% with cues. He required occasional cueing during responsive naming tasks and was able provide >10 items per concrete category during divergent naming. The session was abbreviated to allow pt to use the restroom. SLP will continue to follow pt.    HPI HPI: Pt is a 24 y.o. male recent COVID-19 infection developed viral myocarditis and acute systolic CHF and known  LV thrombus switched to LMWH and noted to have acute onset right arm flaccid paralysis and right leg weakness. MRI brain: Multifocal bilateral frontoparietal infarcts predominantly involving the left cerebrum, likely embolic.      SLP Plan  Continue with current plan of care  Patient needs continued Speech Lanaguage Pathology Services    Recommendations                   Follow up Recommendations: Outpatient SLP SLP Visit Diagnosis: Cognitive communication deficit (B86.754) Plan: Continue with current plan of care       Emili Mcloughlin I. Vear Clock, MS, CCC-SLP Acute Rehabilitation Services Office number [phone] Pager [phone]      Scheryl Marten 04/27/2020, 5:55  PM

## 2020-04-27 NOTE — Progress Notes (Addendum)
Patient ID: Joel [name], male   DOB: 04-Dec-1995, 23 y.o.   MRN: [id number]     Advanced Heart Failure Rounding Note  PCP-Cardiologist: No primary care provider on file.   Subjective:    Last night, he developed acute right-sided weakness while having a bowel movement.  Code stroke called, CTA head/neck was negative.  Symptoms totally resolved.  This morning, he develop tingling left upper arm.  Code stroke again called, neurology at bedside.   He remains on milrinone 0.125 this morning with co-ox 59%, CVP 13.   He is on Lovenox/warfarin for LV thrombus.   CMRI 1.  Possible left basilar PNA. 2.  Small circumferential pericardial effusion. 3. Severe LV dilation with diffuse hypokinesis, EF 15%. There is a small LV apical thrombus. 4.  Moderate RV dilation with EF 19%. 5. LGE pattern concerning for myocarditis, with diffuse patchy mid-wall LGE.  Objective:   Weight Range: 127.4 kg Body mass index is 36.06 kg/m.   Vital Signs:   Temp:  [97.8 F (36.6 C)-98.2 F (36.8 C)] 98.1 F (36.7 C) (10/27 0645) Pulse Rate:  [89-110] 98 (10/27 0645) Resp:  [17-39] 20 (10/27 0645) BP: (117-148)/(81-111) 134/97 (10/27 0645) SpO2:  [94 %-100 %] 95 % (10/27 0645) FiO2 (%):  [21 %] 21 % (10/26 1923) Weight:  [127.4 kg] 127.4 kg (10/27 0300) Last BM Date: 04/26/20  Weight change: Filed Weights   04/25/20 0500 04/26/20 0400 04/27/20 0300  Weight: 119.7 kg 119.7 kg 127.4 kg    Intake/Output:   Intake/Output Summary (Last 24 hours) at 04/27/2020 0924 Last data filed at 04/27/2020 0745 Gross per 24 hour  Intake 904.34 ml  Output 2650 ml  Net -1745.66 ml      Physical Exam   CVP 13 General: NAD Neck: JVP 10-11 cm, no thyromegaly or thyroid nodule.  Lungs: Clear to auscultation bilaterally with normal respiratory effort. CV: Nondisplaced PMI.  Heart regular S1/S2, no S3/S4, no murmur.  No peripheral edema.   Abdomen: Soft, nontender, no hepatosplenomegaly, no distention.   Skin: Intact without lesions or rashes.  Neurologic: Alert and oriented x 3.  Normal strength grossly arms/legs.   Psych: Normal affect. Extremities: No clubbing or cyanosis.  HEENT: Normal.    Telemetry   NSR 90s personally reviewed.    Labs    CBC Recent Labs    04/26/20 0353 04/26/20 0353 04/26/20 0745 04/27/20 0400  WBC 14.5*  --   --  8.2  HGB 15.2   < > 15.2 14.5  HCT 47.1   < > 46.2 44.7  MCV 93.1  --   --  90.7  PLT 248   < > 270 228   < > = values in this interval not displayed.   Basic Metabolic Panel Recent Labs    98/33/82 0353 04/27/20 0400  NA 135 134*  K 4.2 4.0  CL 104 103  CO2 23 23  GLUCOSE 104* 188*  BUN 25* 15  CREATININE 1.34* 1.08  CALCIUM 8.0* 8.0*  MG 1.8  --    Liver Function Tests No results for input(s): AST, ALT, ALKPHOS, BILITOT, PROT, ALBUMIN in the last 72 hours. No results for input(s): LIPASE, AMYLASE in the last 72 hours. Cardiac Enzymes No results for input(s): CKTOTAL, CKMB, CKMBINDEX, TROPONINI in the last 72 hours.  BNP: BNP (last 3 results) No results for input(s): BNP in the last 8760 hours.  ProBNP (last 3 results) No results for input(s): PROBNP in the last 8760 hours.  D-Dimer No results for input(s): DDIMER in the last 72 hours. Hemoglobin A1C No results for input(s): HGBA1C in the last 72 hours. Fasting Lipid Panel No results for input(s): CHOL, HDL, LDLCALC, TRIG, CHOLHDL, LDLDIRECT in the last 72 hours. Thyroid Function Tests Recent Labs    04/25/20 0207  TSH 2.890    Other results:   Imaging    CT Code Stroke CTA Head W/WO contrast  Result Date: 04/26/2020 CLINICAL DATA:  Right-sided weakness EXAM: CT ANGIOGRAPHY HEAD AND NECK TECHNIQUE: Multidetector CT imaging of the head and neck was performed using the standard protocol during bolus administration of intravenous contrast. Multiplanar CT image reconstructions and MIPs were obtained to evaluate the vascular anatomy. Carotid stenosis  measurements (when applicable) are obtained utilizing NASCET criteria, using the distal internal carotid diameter as the denominator. CONTRAST:  7mL OMNIPAQUE IOHEXOL 350 MG/ML SOLN COMPARISON:  None. FINDINGS: CTA NECK FINDINGS SKELETON: There is no bony spinal canal stenosis. No lytic or blastic lesion. OTHER NECK: Normal pharynx, larynx and major salivary glands. No cervical lymphadenopathy. Unremarkable thyroid gland. UPPER CHEST: No pneumothorax or pleural effusion. No nodules or masses. AORTIC ARCH: There is no calcific atherosclerosis of the aortic arch. There is no aneurysm, dissection or hemodynamically significant stenosis of the visualized portion of the aorta. Conventional 3 vessel aortic branching pattern. The visualized proximal subclavian arteries are widely patent. RIGHT CAROTID SYSTEM: Normal without aneurysm, dissection or stenosis. LEFT CAROTID SYSTEM: Normal without aneurysm, dissection or stenosis. VERTEBRAL ARTERIES: Left dominant configuration. Both origins are clearly patent. There is no dissection, occlusion or flow-limiting stenosis to the skull base (V1-V3 segments). CTA HEAD FINDINGS POSTERIOR CIRCULATION: --Vertebral arteries: Normal V4 segments. --Inferior cerebellar arteries: Normal. --Basilar artery: Normal. --Superior cerebellar arteries: Normal. --Posterior cerebral arteries (PCA): Normal. ANTERIOR CIRCULATION: --Intracranial internal carotid arteries: Normal. --Anterior cerebral arteries (ACA): Normal. Both A1 segments are present. Patent anterior communicating artery (a-comm). --Middle cerebral arteries (MCA): Normal. VENOUS SINUSES: As permitted by contrast timing, patent. ANATOMIC VARIANTS: None Review of the MIP images confirms the above findings. IMPRESSION: Normal CTA of the head and neck. Electronically Signed   By: Deatra Robinson M.D.   On: 04/26/2020 19:40   CT Code Stroke CTA Neck W/WO contrast  Result Date: 04/26/2020 CLINICAL DATA:  Right-sided weakness EXAM: CT  ANGIOGRAPHY HEAD AND NECK TECHNIQUE: Multidetector CT imaging of the head and neck was performed using the standard protocol during bolus administration of intravenous contrast. Multiplanar CT image reconstructions and MIPs were obtained to evaluate the vascular anatomy. Carotid stenosis measurements (when applicable) are obtained utilizing NASCET criteria, using the distal internal carotid diameter as the denominator. CONTRAST:  32mL OMNIPAQUE IOHEXOL 350 MG/ML SOLN COMPARISON:  None. FINDINGS: CTA NECK FINDINGS SKELETON: There is no bony spinal canal stenosis. No lytic or blastic lesion. OTHER NECK: Normal pharynx, larynx and major salivary glands. No cervical lymphadenopathy. Unremarkable thyroid gland. UPPER CHEST: No pneumothorax or pleural effusion. No nodules or masses. AORTIC ARCH: There is no calcific atherosclerosis of the aortic arch. There is no aneurysm, dissection or hemodynamically significant stenosis of the visualized portion of the aorta. Conventional 3 vessel aortic branching pattern. The visualized proximal subclavian arteries are widely patent. RIGHT CAROTID SYSTEM: Normal without aneurysm, dissection or stenosis. LEFT CAROTID SYSTEM: Normal without aneurysm, dissection or stenosis. VERTEBRAL ARTERIES: Left dominant configuration. Both origins are clearly patent. There is no dissection, occlusion or flow-limiting stenosis to the skull base (V1-V3 segments). CTA HEAD FINDINGS POSTERIOR CIRCULATION: --Vertebral arteries: Normal V4 segments. --Inferior cerebellar  arteries: Normal. --Basilar artery: Normal. --Superior cerebellar arteries: Normal. --Posterior cerebral arteries (PCA): Normal. ANTERIOR CIRCULATION: --Intracranial internal carotid arteries: Normal. --Anterior cerebral arteries (ACA): Normal. Both A1 segments are present. Patent anterior communicating artery (a-comm). --Middle cerebral arteries (MCA): Normal. VENOUS SINUSES: As permitted by contrast timing, patent. ANATOMIC VARIANTS:  None Review of the MIP images confirms the above findings. IMPRESSION: Normal CTA of the head and neck. Electronically Signed   By: Deatra Robinson M.D.   On: 04/26/2020 19:40   CT HEAD CODE STROKE WO CONTRAST  Result Date: 04/27/2020 CLINICAL DATA:  Code stroke. Stroke, follow-up. Additional history provided: Sudden onset right-sided weakness yesterday with subsequent improvement, new onset of arm tingling today. EXAM: CT HEAD WITHOUT CONTRAST TECHNIQUE: Contiguous axial images were obtained from the base of the skull through the vertex without intravenous contrast. COMPARISON:  Non-contrast head CT and CT angiogram head/neck 04/26/2020. FINDINGS: Brain: Motion degraded examination, most notably at the level of the skull base. Cerebral volume is normal. There is a small acute/early subacute cortical/subcortical ischemic infarct within the left parietal lobe (series 3, images 18-21). In retrospect, this was present on the prior head CT performed one day prior and has not significantly changed since that time. No interval acute infarct is identified. No evidence of intracranial hemorrhage. No extra-axial fluid collection. No evidence of intracranial mass. No midline shift. Vascular: No hyperdense vessel. Skull: Normal. Negative for fracture or focal lesion. Sinuses/Orbits: Visualized orbits show no acute finding. No significant paranasal sinus disease or mastoid effusion at the imaged levels. ASPECTS [facility] Stroke Program Early CT Score) - Ganglionic level infarction (caudate, lentiform nuclei, internal capsule, insula, M1-M3 cortex): 7 - Supraganglionic infarction (M4-M6 cortex): 2 Total score (0-10 with 10 being normal): 9 These results were called by telephone at the time of interpretation on 04/27/2020 at 8:55 am to provider MCNEILL [facility] , who verbally acknowledged these results. IMPRESSION: Small acute/early subacute cortical/subcortical left parietal lobe infarct. ASPECTS is 9. No acute intracranial  hemorrhage. Electronically Signed   By: Jackey Loge DO   On: 04/27/2020 08:57   CT HEAD CODE STROKE WO CONTRAST  Result Date: 04/26/2020 CLINICAL DATA:  Code stroke.  Right-sided weakness EXAM: CT HEAD WITHOUT CONTRAST TECHNIQUE: Contiguous axial images were obtained from the base of the skull through the vertex without intravenous contrast. COMPARISON:  12/09/2012 FINDINGS: Brain: There is no mass, hemorrhage or extra-axial collection. The size and configuration of the ventricles and extra-axial CSF spaces are normal. The brain parenchyma is normal, without evidence of acute or chronic infarction. Vascular: No abnormal hyperdensity of the major intracranial arteries or dural venous sinuses. No intracranial atherosclerosis. Skull: The visualized skull base, calvarium and extracranial soft tissues are normal. Sinuses/Orbits: No fluid levels or advanced mucosal thickening of the visualized paranasal sinuses. No mastoid or middle ear effusion. The orbits are normal. ASPECTS [facility] Stroke Program Early CT Score) - Ganglionic level infarction (caudate, lentiform nuclei, internal capsule, insula, M1-M3 cortex): 7 - Supraganglionic infarction (M4-M6 cortex): 3 Total score (0-10 with 10 being normal): 10 IMPRESSION: 1. Normal head CT. 2. ASPECTS is 10. These results were communicated to Dr. Ritta Slot at 7:10 pm on 04/26/2020 by text page via the [facility] messaging system. Electronically Signed   By: Deatra Robinson M.D.   On: 04/26/2020 19:10     Medications:     Scheduled Medications: .  stroke: mapping our early stages of recovery book   Does not apply Once  . Chlorhexidine Gluconate Cloth  6  each Topical Daily  . coumadin book   Does not apply Once  . dapagliflozin propanediol  10 mg Oral Daily  . digoxin  0.125 mg Oral Daily  . docusate sodium  100 mg Oral Daily  . doxycycline  100 mg Oral Q12H  . furosemide  40 mg Intravenous BID  . potassium chloride  20 mEq Oral BID  .  sacubitril-valsartan  1 tablet Oral BID  . sodium chloride flush  10-40 mL Intracatheter Q12H  . sodium chloride flush  3 mL Intravenous Q12H  . spironolactone  25 mg Oral Daily  . warfarin  10 mg Oral ONCE-1600  . Warfarin - Pharmacist Dosing Inpatient   Does not apply q1600    Infusions: . sodium chloride    . cefTRIAXone (ROCEPHIN)  IV 2 g (04/26/20 2202)  . heparin    . milrinone 0.125 mcg/kg/min (04/27/20 0616)    PRN Medications: sodium chloride, acetaminophen, guaiFENesin-dextromethorphan, ondansetron (ZOFRAN) IV, sodium chloride flush, sodium chloride flush   Assessment/Plan   1. Acute Biventricular HF: Echo at [facility] with EF < 20%, LV thrombus noted.  He has had dyspnea, orthopnea, and pleuritic chest pain since he had COVID-19 in June. He feels like he never improved.  No family history of cardiomyopathy or premature CAD.  Suspect most likely etiology for cardiomyopathy is viral myocarditis due to COVID-19.  HIV negative. CMRI LVEF 15% RVEF 19%, LGE pattern concerning for myocarditis. Today, CVP 13 with co-ox 59%. With TIA/CVA, will not change meds today. - Continue milrinone 0.125 mcg/kg/min today, stop tomorrow if stable.    - Lasix 40 mg IV bid today with CVP still elevated.  - Continue digoxin 0.125 mg.  - Continue Entresto 24/26 bid.  - Spironolactone 25 daily. - Continue Farxiga 10 mg daily  2. ID: CT chest and CMRI  concerning for multifocal PNA. COVID-19 negative at [place]. ?Sequelae of prior COVID-19 infection with lung scarring or bacterial PNA. Cultures NGTD.  - Cover with ceftriaxone/doxycycline for CAP.   3. LV thrombus: On Lovenox/warfarin.   - Discussed with neuro, with TIA, ?CVA will transition back to heparin gtt/warfarin.    4. AKI: Resolved.   5. TIA/?CVA: TIA last night with complete recovery, CTA head was negative.  Now with LUE tingling, going back for head MRI. - Neuro following.  - Anticoagulation per neuro.   [name] 04/27/2020 9:24  AM

## 2020-04-27 NOTE — Code Documentation (Addendum)
Stroke Response Nurse Documentation Code Documentation  Joel [name] is a 24 y.o. male admitted 04/24/20 to [facility] for shortness of breath and hemoptysis x 20month Echo revealed EF <20% and LV thrombus. Past medical hx of hypertension, anxiety and recent COVID-19 infection. RRT responded to a call yesterday with patient complaining of right arm flaccid and right leg weakness. This event occurred while patient was having a bowel movement and completley resolved within an hour.  Code stroke was activated by RRT and bedside RN. Patient had been on the bedside commode where he was LKW at [phone] now complaining of tingling in left side of face radiating down to proximal left arm. On lovenox this admit.  Stroke [place] and pharmacy present. NIHSS 0, see documentation for details and code stroke times. Patient to CT with team. The following imaging was completed:  CT. Patient is not a candidate for tPA due to anticoagulants. Care/Plan- MRI; provider to change lovenox to heparin gtt; continue to monitor on 2C with VS and neuro checks q2 hours. Bedside handoff with bedside RN BPamala Hurry    TLeverne HumblesStroke Response RN

## 2020-04-27 NOTE — Plan of Care (Signed)
  Problem: Education: Goal: Knowledge of General Education information will improve Description: Including pain rating scale, medication(s)/side effects and non-pharmacologic comfort measures Outcome: Progressing   Problem: Health Behavior/Discharge Planning: Goal: Ability to manage health-related needs will improve Outcome: Progressing   Problem: Clinical Measurements: Goal: Ability to maintain clinical measurements within normal limits will improve Outcome: Progressing   Problem: Clinical Measurements: Goal: Diagnostic test results will improve Outcome: Progressing   Problem: Activity: Goal: Risk for activity intolerance will decrease Outcome: Progressing   Problem: Nutrition: Goal: Adequate nutrition will be maintained Outcome: Progressing   Problem: Coping: Goal: Level of anxiety will decrease Outcome: Progressing   Problem: Pain Managment: Goal: General experience of comfort will improve Outcome: Progressing   Problem: Education: Goal: Ability to verbalize understanding of medication therapies will improve Outcome: Progressing   Problem: Activity: Goal: Capacity to carry out activities will improve Outcome: Progressing   Problem: Education: Goal: Knowledge of patient specific risk factors addressed and post discharge goals established will improve Outcome: Progressing

## 2020-04-27 NOTE — Plan of Care (Signed)
  Problem: Education: Goal: Knowledge of General Education information will improve Description: Including pain rating scale, medication(s)/side effects and non-pharmacologic comfort measures Outcome: Progressing   Problem: Health Behavior/Discharge Planning: Goal: Ability to manage health-related needs will improve Outcome: Progressing   Problem: Clinical Measurements: Goal: Ability to maintain clinical measurements within normal limits will improve Outcome: Progressing Goal: Will remain free from infection Outcome: Progressing Goal: Diagnostic test results will improve Outcome: Progressing Goal: Respiratory complications will improve Outcome: Progressing Goal: Cardiovascular complication will be avoided Outcome: Progressing   Problem: Activity: Goal: Risk for activity intolerance will decrease Outcome: Progressing   Problem: Nutrition: Goal: Adequate nutrition will be maintained Outcome: Progressing   Problem: Coping: Goal: Level of anxiety will decrease Outcome: Progressing   Problem: Elimination: Goal: Will not experience complications related to bowel motility Outcome: Progressing Goal: Will not experience complications related to urinary retention Outcome: Progressing   Problem: Pain Managment: Goal: General experience of comfort will improve Outcome: Progressing   Problem: Safety: Goal: Ability to remain free from injury will improve Outcome: Progressing   Problem: Skin Integrity: Goal: Risk for impaired skin integrity will decrease Outcome: Progressing   Problem: Education: Goal: Ability to demonstrate management of disease process will improve Outcome: Progressing Goal: Ability to verbalize understanding of medication therapies will improve Outcome: Progressing Goal: Individualized Educational Video(s) Outcome: Progressing   Problem: Activity: Goal: Capacity to carry out activities will improve Outcome: Progressing   Problem: Cardiac: Goal:  Ability to achieve and maintain adequate cardiopulmonary perfusion will improve Outcome: Progressing   Problem: Activity: Goal: Ability to tolerate increased activity will improve Outcome: Progressing   Problem: Clinical Measurements: Goal: Ability to maintain a body temperature in the normal range will improve Outcome: Progressing   Problem: Respiratory: Goal: Ability to maintain adequate ventilation will improve Outcome: Progressing Goal: Ability to maintain a clear airway will improve Outcome: Progressing   

## 2020-04-27 NOTE — Progress Notes (Signed)
[facility] Radiology called results of MRI with multifocal infarcts bilaterally, information relayed to Dr Amada Jupiter

## 2020-04-27 NOTE — Evaluation (Signed)
Speech Language Pathology Evaluation Patient Details Name: Joel [name] MRN: [id number] DOB: 1996-06-04 Today's Date: 04/27/2020 Time: 1700-1749 SLP Time Calculation (min) (ACUTE ONLY): 23 min  Problem List:  Patient Active Problem List   Diagnosis Date Noted  . Acute systolic CHF (congestive heart failure) (HCC) 04/24/2020  . LV (left ventricular) mural thrombus without MI (HCC) 04/24/2020  . Dilated cardiomyopathy (HCC) 04/24/2020  . History of COVID-19 04/24/2020  . Multifocal pneumonia 04/24/2020   Past Medical History:  Past Medical History:  Diagnosis Date  . Anxiety   . Chronic systolic CHF 04/24/2020   Echo at [facility] 04/2020: EF < 20, mild to mod MR, LV thrombus  . Depression   . Dilated cardiomyopathy (HCC)   . History of COVID-19   . Hypertension   . LV (left ventricular) mural thrombus    Past Surgical History:  Past Surgical History:  Procedure Laterality Date  . TONSILLECTOMY     HPI:  Pt is a 24 y.o. male recent COVID-19 infection developed viral myocarditis and acute systolic CHF and known  LV thrombus switched to LMWH and noted to have acute onset right arm flaccid paralysis and right leg weakness. MRI brain: Multifocal bilateral frontoparietal infarcts predominantly involving the left cerebrum, likely embolic.   Assessment / Plan / Recommendation Clinical Impression  Pt participated in speech/language/cognition evaluation with his sister present. Pt reported that he has a high-school education and is employed full time as a Nutritional therapist. Pt stated that he is "not that smart and not good at math or reading" but denied any other difficulties in speech, language, or cognition. He stated that he now has word retrieval difficulty and therefore has to speak more slowly. The pt expressed that he was nervous throughout the evaluation and this may have negatively impacted his performance.   Assessment of the pt's speech and language skills revealed WNL motor  speech function and mild non-fluent aphasia. He required repetition and rehearsal for completion of multi-step commands, complex yes/no questions, and paragraph level material. He was able to participate in conversation but additional processing time was intermittently needed for word retrieval. The [facility] Mental Status Examination was completed to evaluate the pt's cognitive-linguistic skills. He achieved a score of 12/30 which is below the normal limits of 27 or more out of 30. The impact of his auditory comprehension skills on his performance is considered; however, he exhibited difficulty in the areas of attention, memory, mental flexibility, and executive function. Skilled SLP services are clinically indicated at this time to improve language and cognitive-linguistic function.    SLP Assessment  SLP Recommendation/Assessment: Patient needs continued Speech Lanaguage Pathology Services SLP Visit Diagnosis: Cognitive communication deficit (R41.841)    Follow Up Recommendations  Outpatient SLP    Frequency and Duration min 2x/week  2 weeks      SLP Evaluation Cognition  Overall Cognitive Status: Impaired/Different from baseline Arousal/Alertness: Awake/alert Orientation Level: Oriented X4 (with self-correction) Attention: Focused;Sustained Focused Attention: Impaired Focused Attention Impairment: Verbal complex Sustained Attention: Impaired Sustained Attention Impairment: Verbal complex Memory: Impaired Memory Impairment: Retrieval deficit;Decreased recall of new information (Immediate: 5/5; delayed: 0/5 with cues: 0/5) Awareness: Appears intact Problem Solving: Impaired Executive Function: Reasoning;Sequencing;Organizing Sequencing: Impaired Sequencing Impairment: Verbal complex Organizing: Impaired Organizing Impairment: Verbal complex (Backward digit span: 0/3)       Comprehension  Auditory Comprehension Overall Auditory Comprehension: Impaired Yes/No  Questions: Impaired Basic Biographical Questions:  (5/5) Complex Questions:  (5/5) Paragraph Comprehension (via yes/no questions):  (2/4)  Commands: Within Functional Limits Two Step Basic Commands:  (3/4) Multistep Basic Commands:  (3/3)    Expression Expression Primary Mode of Expression: Verbal Verbal Expression Overall Verbal Expression: Impaired Initiation: No impairment Automatic Speech: Day of week;Counting;Month of year (WNL; ) Level of Generative/Spontaneous Verbalization: Conversation (with intermittent word retrieval difficulty) Repetition: No impairment (5/5) Naming: No impairment Responsive:  (4/5) Confrontation:  (10/10) Convergent:  (5/5) Pragmatics: No impairment   Oral / Motor  Oral Motor/Sensory Function Overall Oral Motor/Sensory Function: Within functional limits Motor Speech Overall Motor Speech: Appears within functional limits for tasks assessed Respiration: Within functional limits Phonation: Normal Resonance: Within functional limits Articulation: Within functional limitis Intelligibility: Intelligible Motor Planning: Witnin functional limits Motor Speech Errors: Not applicable   Angely Dietz I. Vear Clock, MS, CCC-SLP Acute Rehabilitation Services Office number [phone] Pager [phone]                    Scheryl Marten 04/27/2020, 5:48 PM

## 2020-04-27 NOTE — Progress Notes (Signed)
ANTICOAGULATION CONSULT NOTE   Pharmacy Consult for lovenox>heparin and warfarin Indication: LV thrombus  No Known Allergies  Patient Measurements: Height: 6\' 2"  (188 cm) Weight: 127.4 kg (280 lb 13.9 oz) IBW/kg (Calculated) : 82.2 Heparin Dosing Weight: 108.7 kg  Vital Signs: Temp: 98.1 F (36.7 C) (10/27 0645) Temp Source: Oral (10/27 0645) BP: 134/97 (10/27 0645) Pulse Rate: 98 (10/27 0645)  Labs: Recent Labs    04/24/20 1918 04/25/20 0207 04/25/20 0207 04/25/20 1539 04/25/20 2327 04/26/20 0353 04/26/20 0353 04/26/20 0745 04/27/20 0400  HGB   < >  --   --   --   --  15.2   < > 15.2 14.5  HCT   < >  --   --   --   --  47.1  --  46.2 44.7  PLT   < >  --   --   --   --  248  --  270 228  APTT  --   --   --   --   --   --   --  38*  --   LABPROT  --   --   --   --   --  15.7*  --  14.1 14.0  INR  --   --   --   --   --  1.3*  --  1.1 1.1  HEPARINUNFRC  --  <0.10*   < > <0.10* <0.10*  --   --  <0.10*  --   CREATININE  --  1.78*  --   --   --  1.34*  --   --  1.08   < > = values in this interval not displayed.    Estimated Creatinine Clearance: 152.2 mL/min (by C-G formula based on SCr of 1.08 mg/dL).   Medical History: Past Medical History:  Diagnosis Date  . Anxiety   . Chronic systolic CHF 04/24/2020   Echo at [facility] 04/2020: EF < 20, mild to mod MR, LV thrombus  . Depression   . Dilated cardiomyopathy (HCC)   . History of COVID-19   . Hypertension   . LV (left ventricular) mural thrombus     Medications:  Scheduled:  .  stroke: mapping our early stages of recovery book   Does not apply Once  . Chlorhexidine Gluconate Cloth  6 each Topical Daily  . coumadin book   Does not apply Once  . dapagliflozin propanediol  10 mg Oral Daily  . digoxin  0.125 mg Oral Daily  . docusate sodium  100 mg Oral Daily  . doxycycline  100 mg Oral Q12H  . furosemide  40 mg Intravenous BID  . potassium chloride  20 mEq Oral BID  . sacubitril-valsartan  1 tablet  Oral BID  . sodium chloride flush  10-40 mL Intracatheter Q12H  . sodium chloride flush  3 mL Intravenous Q12H  . spironolactone  25 mg Oral Daily  . warfarin  10 mg Oral ONCE-1600  . Warfarin - Pharmacist Dosing Inpatient   Does not apply q1600    Assessment: 24 yof transferred from [facility] with SOB and hemoptysis for 1 month. ECHO found LVEF<20% and LV thrombus.   Heparin level had been undetectable since admit, peripheral stick verified undetectable yesterday patient then switched to lovenox. He received two doses yesterday. Stroke/TIA last night involving right side then again a code stroke was called this morning, small infarct noted on CT. High risk for recurrent embolism, which outweighs  risk of hemorrhage. Detailed discussion with neurology/cardiology and given risk of hemorrhagic transformation will transition back to IV heparin and will plan on being more aggressive. Anti-xa level was 0.23 about 12 hours after dose of lovenox, this would indicate adequate anticoagulation in most settings, patient may benefit from less peaks and troughs in anticoagulation while warfarin is being loaded.   Neurology did indicate pharmacy could give small boluses if heparin levels aren't trending up quickly enough.   Goal of Therapy:  INR goal 2-3 Heparin level 0.3-0.7 units/ml Monitor platelets by anticoagulation protocol: Yes   Plan:  Back to heparin this morning after CT done, restart at higher rate of 2600 units/hr Warfarin 10mg  again this afternoon Daily INR Will consult TOC as patient says he has no medication insurance Will provide education on HF meds and anticoagulants prior to discharge  PharmD., BCPS Clinical Pharmacist 04/27/2020 9:21 AM

## 2020-04-28 ENCOUNTER — Encounter (HOSPITAL_COMMUNITY): Payer: Self-pay | Admitting: Cardiology

## 2020-04-28 DIAGNOSIS — I6340 Cerebral infarction due to embolism of unspecified cerebral artery: Secondary | ICD-10-CM

## 2020-04-28 HISTORY — DX: Cerebral infarction due to embolism of unspecified cerebral artery: I63.40

## 2020-04-28 LAB — LIPID PANEL
Cholesterol: 128 mg/dL (ref 0–200)
HDL: 25 mg/dL — ABNORMAL LOW (ref >40)
LDL Cholesterol: 85 mg/dL (ref 0–99)
Total CHOL/HDL Ratio: 5.1 RATIO
Triglycerides: 89 mg/dL (ref <150)
VLDL: 18 mg/dL (ref 0–40)

## 2020-04-28 LAB — BASIC METABOLIC PANEL
Anion gap: 11 (ref 5–15)
BUN: 14 mg/dL (ref 6–20)
CO2: 25 mmol/L (ref 22–32)
Calcium: 8 mg/dL — ABNORMAL LOW (ref 8.9–10.3)
Chloride: 102 mmol/L (ref 98–111)
Creatinine, Ser: 1.17 mg/dL (ref 0.61–1.24)
GFR, Estimated: >60 mL/min (ref >60)
Glucose, Bld: 99 mg/dL (ref 70–99)
Potassium: 3.8 mmol/L (ref 3.5–5.1)
Sodium: 138 mmol/L (ref 135–145)

## 2020-04-28 LAB — PROTIME-INR
INR: 1.1 (ref 0.8–1.2)
Prothrombin Time: 13.7 seconds (ref 11.4–15.2)

## 2020-04-28 LAB — COOXEMETRY PANEL
Carboxyhemoglobin: 0.9 % (ref 0.5–1.5)
Carboxyhemoglobin: 0.9 % (ref 0.5–1.5)
Methemoglobin: 0.7 % (ref 0.0–1.5)
Methemoglobin: 0.6 % (ref 0.0–1.5)
O2 Saturation: 51.3 %
O2 Saturation: 48.8 %
Total hemoglobin: 15.7 g/dL (ref 12.0–16.0)
Total hemoglobin: 16.7 g/dL — ABNORMAL HIGH (ref 12.0–16.0)

## 2020-04-28 LAB — CBC
HCT: 47.3 % (ref 39.0–52.0)
Hemoglobin: 15.1 g/dL (ref 13.0–17.0)
MCH: 29 pg (ref 26.0–34.0)
MCHC: 31.9 g/dL (ref 30.0–36.0)
MCV: 91 fL (ref 80.0–100.0)
Platelets: 233 10*3/uL (ref 150–400)
RBC: 5.2 MIL/uL (ref 4.22–5.81)
RDW: 14.2 % (ref 11.5–15.5)
WBC: 6.9 10*3/uL (ref 4.0–10.5)
nRBC: 0 % (ref 0.0–0.2)

## 2020-04-28 LAB — HEMOGLOBIN A1C
Hgb A1c MFr Bld: 5.8 % — ABNORMAL HIGH (ref 4.8–5.6)
Mean Plasma Glucose: 120 mg/dL

## 2020-04-28 LAB — HEPARIN LEVEL (UNFRACTIONATED): Heparin Unfractionated: 0.53 IU/mL (ref 0.30–0.70)

## 2020-04-28 MED ORDER — WARFARIN SODIUM 7.5 MG PO TABS
15.0000 mg | ORAL_TABLET | Freq: Once | ORAL | Status: AC
  Administered 2020-04-28: 15 mg via ORAL
  Filled 2020-04-28: qty 2

## 2020-04-28 MED ORDER — SACUBITRIL-VALSARTAN 49-51 MG PO TABS
1.0000 | ORAL_TABLET | Freq: Two times a day (BID) | ORAL | Status: DC
  Administered 2020-04-28: 1 via ORAL
  Filled 2020-04-28 (×2): qty 1

## 2020-04-28 MED ORDER — ATORVASTATIN CALCIUM 10 MG PO TABS
10.0000 mg | ORAL_TABLET | Freq: Every day | ORAL | Status: DC
  Administered 2020-04-28 – 2020-05-01 (×4): 10 mg via ORAL
  Filled 2020-04-28 (×4): qty 1

## 2020-04-28 MED ORDER — INFLUENZA VAC SPLIT QUAD 0.5 ML IM SUSY
0.5000 mL | PREFILLED_SYRINGE | INTRAMUSCULAR | Status: AC
  Administered 2020-05-01: 0.5 mL via INTRAMUSCULAR
  Filled 2020-04-28: qty 0.5

## 2020-04-28 NOTE — Progress Notes (Signed)
Physical Therapy Treatment Patient Details Name: Joel [name] MRN: [id number] DOB: March 03, 1996 Today's Date: 04/28/2020    History of Present Illness 24 y.o. male recent COVID-19 infection developed viral myocarditis and acute systolic CHF and known  LV thrombus switched to LMWH 04/26/20 noted to have acute onset right arm flaccid paralysis and right leg weakness which resolved. 04/27/20 had additional bout of tingling in L upper arm    PT Comments    Pt supine in bed on arrival.  No balance deficits noted will inform supervising PT of need for d/c from caseload as he will continue to ambulate independently. Instructed patient he can ambulate halls and discussed boundaries to stay on unit.  Pt tolerated stair training well.    Follow Up Recommendations  No PT follow up     Equipment Recommendations  None recommended by PT    Recommendations for Other Services       Precautions / Restrictions Precautions Precautions: None Restrictions Weight Bearing Restrictions: No    Mobility  Bed Mobility Overal bed mobility: Independent                Transfers Overall transfer level: Independent                  Ambulation/Gait Ambulation/Gait assistance: Independent Gait Distance (Feet): 400 Feet Assistive device: None Gait Pattern/deviations: Step-through pattern;WFL(Within Functional Limits)     General Gait Details: steady gait and no impairments during DGI   Stairs Stairs: Yes Stairs assistance: Independent Stair Management: One rail Right Number of Stairs: 5 General stair comments: Cues for safety but on 2nd trial no cues or assistance needed,   Wheelchair Mobility    Modified Rankin (Stroke Patients Only)       Balance Overall balance assessment: Independent                               Standardized Balance Assessment Standardized Balance Assessment : Dynamic Gait Index   Dynamic Gait Index Level Surface: Normal Change  in Gait Speed: Normal Gait with Horizontal Head Turns: Normal Gait with Vertical Head Turns: Normal Gait and Pivot Turn: Normal Step Over Obstacle: Normal Step Around Obstacles: Normal Steps: Normal Total Score: 24      Cognition Arousal/Alertness: Awake/alert Behavior During Therapy: WFL for tasks assessed/performed Overall Cognitive Status: Impaired/Different from baseline                                        Exercises      General Comments        Pertinent Vitals/Pain Pain Assessment: No/denies pain    Home Living                      Prior Function            PT Goals (current goals can now be found in the care plan section) Acute Rehab PT Goals Patient Stated Goal: go hunting Potential to Achieve Goals: Good Progress towards PT goals: Progressing toward goals    Frequency    Min 3X/week      PT Plan Current plan remains appropriate    Co-evaluation              AM-PAC PT "6 Clicks" Mobility   Outcome Measure  Help needed turning from your back to your side  while in a flat bed without using bedrails?: None Help needed moving from lying on your back to sitting on the side of a flat bed without using bedrails?: None Help needed moving to and from a bed to a chair (including a wheelchair)?: None Help needed standing up from a chair using your arms (e.g., wheelchair or bedside chair)?: None Help needed to walk in hospital room?: None Help needed climbing 3-5 steps with a railing? : None 6 Click Score: 24    End of Session Equipment Utilized During Treatment: Gait belt Activity Tolerance: Patient tolerated treatment well Patient left: in bed;with call bell/phone within reach;with family/visitor present Nurse Communication: Mobility status (informed nursing he was safe to ambulate halls without assistance.) PT Visit Diagnosis: Other abnormalities of gait and mobility (R26.89);Other symptoms and signs involving the  nervous system (R29.898)     Time: 2035-5974 PT Time Calculation (min) (ACUTE ONLY): 12 min  Charges:  $Gait Training: 8-22 mins                     Joel [name] , PTA Acute Rehabilitation Services Pager [phone] Office [phone]     Joel [name] Joel [name] 04/28/2020, 3:48 PM

## 2020-04-28 NOTE — TOC Transition Note (Addendum)
Transition of Care [facility]) - CM/SW Discharge Note   Patient Details  Name: Joel [name] MRN: [id number] Date of Birth: 02/12/1996  Transition of Care [facility]) CM/SW Contact:  Leone Haven, RN Phone Number: 04/28/2020, 9:15 AM   Clinical Narrative:    NCM spoke with patient, he states he would like to change his PCP to the [facility],  NCM scheduled an follow up apt for him at the [facility] clinic, listed on AVS.  Also NCM will assist him with medications at discharge.  MD will need to send scripts to [facility] pharmacy so they NCM can assist with meds.   Per MD note adding entresto, farxiga, possible lovenox at discharge.  [name] with HF pharmacy said they will be helping to assist him with the heart medications and they will evaluate if they need TOC to fill his meds by tomorrow or if patient will be here thru the weekend.   10/30- per patient [name] with HF pharmacy, has his medications filled for him and if he is discharged over the weekend, the RN will give him the meds they have put away for him.  NCM gave him a Match Letter just in case he needs extra coverage.   Final next level of care: Home/Self Care Barriers to Discharge: Continued Medical Work up   Patient Goals and CMS Choice Patient states their goals for this hospitalization and ongoing recovery are:: get better   Choice offered to / list presented to : NA  Discharge Placement                       Discharge Plan and Services                  DME Agency: NA       HH Arranged: NA          Social Determinants of Health (SDOH) Interventions     Readmission Risk Interventions No flowsheet data found.

## 2020-04-28 NOTE — Progress Notes (Signed)
STROKE TEAM PROGRESS NOTE   INTERVAL HISTORY His grandparents are at the bedside.  He is lying in the bed.  I have personally reviewed history of presenting illness with the patient, electronic medical records and imaging films in PACS.  He is sitting up in bed.  He is doing fine.  He has no complaints.  He is on IV heparin.  And heparin level is optimal today at 0.53. MRI scan of the brain shows bilateral embolic strokes in multiple vascular territories.  CT angiogram shows no large vessel stenosis or occlusion.  MRI scan of the heart had shown LV mural clot.  Vitals:   04/28/20 0315 04/28/20 0738 04/28/20 0806 04/28/20 1038  BP: (!) 139/92 (!) 139/99  (!) 141/106  Pulse: 98 96 (!) 104 98  Resp: 19 19  20   Temp: 97.8 F (36.6 C) 97.9 F (36.6 C)  98 F (36.7 C)  TempSrc: Oral Oral  Oral  SpO2: 91% 97%  94%  Weight: 113.4 kg     Height:       CBC:  Recent Labs  Lab 04/27/20 0400 04/28/20 0302  WBC 8.2 6.9  HGB 14.5 15.1  HCT 44.7 47.3  MCV 90.7 91.0  PLT 228 233   Basic Metabolic Panel:  Recent Labs  Lab 04/26/20 0353 04/26/20 0353 04/27/20 0400 04/28/20 0302  NA 135   < > 134* 138  K 4.2   < > 4.0 3.8  CL 104   < > 103 102  CO2 23   < > 23 25  GLUCOSE 104*   < > 188* 99  BUN 25*   < > 15 14  CREATININE 1.34*   < > 1.08 1.17  CALCIUM 8.0*   < > 8.0* 8.0*  MG 1.8  --   --   --    < > = values in this interval not displayed.   Lipid Panel:  Recent Labs  Lab 04/28/20 0830  CHOL 128  TRIG 89  HDL 25*  CHOLHDL 5.1  VLDL 18  LDLCALC 85   HgbA1c: No results for input(s): HGBA1C in the last 168 hours. Urine Drug Screen: No results for input(s): LABOPIA, COCAINSCRNUR, LABBENZ, AMPHETMU, THCU, LABBARB in the last 168 hours.  Alcohol Level No results for input(s): ETH in the last 168 hours.  IMAGING past 24 hours MR BRAIN WO CONTRAST  Result Date: 04/27/2020 CLINICAL DATA:  Stroke, follow-up. EXAM: MRI HEAD WITHOUT CONTRAST TECHNIQUE: Multiplanar, multiecho  pulse sequences of the brain and surrounding structures were obtained without intravenous contrast. COMPARISON:  04/27/2020 head CT and prior. 04/26/2020 CTA head and neck. FINDINGS: Brain: Multifocal acute infarcts involving the bilateral frontoparietal regions demonstrating left predominance. Left cerebral foci of SWI signal dropout reflect petechial hemorrhages. No midline shift, ventriculomegaly or extra-axial fluid collection. No mass lesion. Vascular: Please see recent CTA head and neck. Skull and upper cervical spine: Normal marrow signal. Sinuses/Orbits: Normal orbits. Clear paranasal sinuses. No mastoid effusion. Other: None. IMPRESSION: Multifocal bilateral frontoparietal infarcts predominantly involving the left cerebrum, likely embolic. These results will be called to the ordering clinician or representative by the Radiologist Assistant, and communication documented in the PACS or 04/28/2020. Electronically Signed   By: [employer] M.D.   On: 04/27/2020 14:52    PHYSICAL EXAM Pleasant young obese Caucasian male not in distress. . Afebrile. Head is nontraumatic. Neck is supple without bruit.    Cardiac exam no murmur or gallop. Lungs are clear to auscultation. Distal  pulses are well felt. Neurological Exam ;  Awake  Alert oriented x 3. Normal speech and language.eye movements full without nystagmus.fundi were not visualized. Vision acuity and fields appear normal. Hearing is normal. Palatal movements are normal. Face symmetric. Tongue midline. Normal strength, tone, reflexes and coordination except mild right grip weakness and diminished fine motor skills on the right.  Orbits left over right upper extremity.  Right mild right lower extremity hip flexor weakness 4/5.Marland Kitchen Normal sensation. Gait deferred.  ASSESSMENT/PLAN Mr. IANN RODIER is a 24 y.o. male with history of recent COVID-19 infection who developed myocarditis with acute systolic CHF and LV thrombus who was switched  to LMWH the night before developing R arm flaccidity and R leg weakness in the hospital.   Stroke:   Multiple B frontoparietal infarcts  Embolic in setting of LV clot post COVID infection  Code Stroke CT head 10/25 No acute abnormality.  ASPECTS 10.     CTA head & neck Unremarkable  Code Stroke CT head 10/27 small L parietal cortical / subcortical infarcts.  ASPECTS 9  MRI  Multiple B frontoparietal infarct involving L cerebram  2D Echo at [facility] w/ EF 20% and LV clot  LDL 85  HgbA1c pending    VTE prophylaxis - IV heparin  on heparin IV as bridge to warfarin.   Therapy recommendations:  No PT  Disposition:  Return home  Expect good resolution of stroke symptoms  LV mural thrombus  Acute biventricular heart failure post COVID w/ EF 20% Dilated cardiomyopathy Likely d/t myocarditis post COVID  Cardiology managing  On IV heparin as bridge to warfarin  INR 1.1, goal 2-3  Hyperlipidemia  Home meds:  No statin  LDL 85, goal < 70  Add lipitor 10 - will not do high intensity d/t non-atherosclerotic source of stroke  Continue statin at discharge  Other Stroke Risk Factors  Former Cigarette smoker, states he has quit  Smokeless tobacco user (chew) advised to stop using d/t stroke risk  Hx ETOH use, advised to drink no more than 2 drink(s) a day  Obesity, Body mass index is 32.09 kg/m., recommend weight loss, diet and exercise as appropriate   Family hx stroke (father)  Hx COVID in June 2021  Other Active Problems  Depression on buspar, lexapro  AKI resolved  Hospital day # 4 Patient has had by cerebral embolic infarcts likely from LV mural clot.  Agree with IV heparin bridge for warfarin until INR is optimal between 2 and 3.  Mobilize out of bed.  Therapy consults.  Aggressive risk factor modification.  Long discussion with patient and his grandparents and answered questions.  Greater than 50% time during the 35-minute visit was spent in  counseling and coordination of care about his embolic strokes and discussion about treatment plan and answering questions Delia Heady, MD  To contact Stroke Continuity provider, please refer to [email or web address]. After hours, contact General Neurology

## 2020-04-28 NOTE — Progress Notes (Signed)
Physical Therapy Discharge Patient Details Name: Joel [name] MRN: [id number] DOB: [birth year] Today's Date: 04/28/2020 Time: 4835-0757 PT Time Calculation (min) (ACUTE ONLY): 12 min  Patient discharged from PT services secondary to goals met and no further PT needs identified.  Please see latest therapy progress note for current level of functioning and progress toward goals.    Progress and discharge plan discussed with patient and/or caregiver: Patient/Caregiver agrees with plan  GP     Yvanna Vidas Eli Hose 04/28/2020, 3:54 PM

## 2020-04-28 NOTE — Progress Notes (Deleted)
Patient ID: Joel [name], male   DOB: 09/30/1995, 24 y.o.   MRN: [id number]     Advanced Heart Failure Rounding Note  PCP-Cardiologist: No primary care provider on file.   Subjective:    10/26: developed acute right-sided weakness while having a bowel movement.  Code stroke called, CTA head/neck was negative.  Symptoms totally resolved.  10/27: develop tingling left upper arm.  Code stroke again called, MRI showed multifocal b/l frontoparietal infarcts.   He remains on milrinone 0.125 this morning with co-ox 51%, CVP 3-4. Will repeat co-ox today if improved to 58% will d/c milrinone.  Will d/c lasix today and increase entresto dose.     Objective:   Weight Range: 113.4 kg Body mass index is 32.09 kg/m.   Vital Signs:   Temp:  [97.8 F (36.6 C)-98.5 F (36.9 C)] 97.9 F (36.6 C) (10/28 0738) Pulse Rate:  [93-106] 104 (10/28 0806) Resp:  [14-48] 19 (10/28 0738) BP: (127-142)/(90-104) 139/99 (10/28 0738) SpO2:  [91 %-97 %] 97 % (10/28 0738) Weight:  [113.4 kg] 113.4 kg (10/28 0315) Last BM Date: 04/26/20  Weight change: Filed Weights   04/26/20 0400 04/27/20 0300 04/28/20 0315  Weight: 119.7 kg 127.4 kg 113.4 kg    Intake/Output:   Intake/Output Summary (Last 24 hours) at 04/28/2020 0856 Last data filed at 04/28/2020 0813 Gross per 24 hour  Intake 946.08 ml  Output 4325 ml  Net -3378.92 ml      Physical Exam   CVP 3-4 General:  Well appearing. No resp difficulty.  Speech clear.  HEENT: PERRLA, EOM intact.  Neck: supple. no JVD. Carotids 2+ bilat; no bruits. No lymphadenopathy appreciated. Cor: Regular rate & rhythm. No rubs, gallops or murmurs. Lungs: clear bilaterally.  Abdomen: soft, nontender, nondistended. No hepatosplenomegaly. No bruits or masses. Good bowel sounds in all 4 quadrants.  Extremities: no cyanosis, clubbing, rash, edema. BUE/BLE 5/5 strength.  Heel to shin intact.   Neuro: alert & orientedx3, cranial nerves 2-12 grossly intact. moves all  4 extremities w/o difficulty. Affect pleasant    Telemetry   NSR 80-90s personally reviewed.    Labs    CBC Recent Labs    04/27/20 0400 04/28/20 0302  WBC 8.2 6.9  HGB 14.5 15.1  HCT 44.7 47.3  MCV 90.7 91.0  PLT 228 233   Basic Metabolic Panel Recent Labs    40/98/11 0353 04/26/20 0353 04/27/20 0400 04/28/20 0302  NA 135   < > 134* 138  K 4.2   < > 4.0 3.8  CL 104   < > 103 102  CO2 23   < > 23 25  GLUCOSE 104*   < > 188* 99  BUN 25*   < > 15 14  CREATININE 1.34*   < > 1.08 1.17  CALCIUM 8.0*   < > 8.0* 8.0*  MG 1.8  --   --   --    < > = values in this interval not displayed.   Liver Function Tests No results for input(s): AST, ALT, ALKPHOS, BILITOT, PROT, ALBUMIN in the last 72 hours. No results for input(s): LIPASE, AMYLASE in the last 72 hours. Cardiac Enzymes No results for input(s): CKTOTAL, CKMB, CKMBINDEX, TROPONINI in the last 72 hours.  BNP: BNP (last 3 results) No results for input(s): BNP in the last 8760 hours.  ProBNP (last 3 results) No results for input(s): PROBNP in the last 8760 hours.   D-Dimer No results for input(s): DDIMER in the last  72 hours. Hemoglobin A1C No results for input(s): HGBA1C in the last 72 hours. Fasting Lipid Panel No results for input(s): CHOL, HDL, LDLCALC, TRIG, CHOLHDL, LDLDIRECT in the last 72 hours. Thyroid Function Tests No results for input(s): TSH, T4TOTAL, T3FREE, THYROIDAB in the last 72 hours.  Invalid input(s): FREET3  Other results:   Imaging    MR BRAIN WO CONTRAST  Result Date: 04/27/2020 CLINICAL DATA:  Stroke, follow-up. EXAM: MRI HEAD WITHOUT CONTRAST TECHNIQUE: Multiplanar, multiecho pulse sequences of the brain and surrounding structures were obtained without intravenous contrast. COMPARISON:  04/27/2020 head CT and prior. 04/26/2020 CTA head and neck. FINDINGS: Brain: Multifocal acute infarcts involving the bilateral frontoparietal regions demonstrating left predominance. Left  cerebral foci of SWI signal dropout reflect petechial hemorrhages. No midline shift, ventriculomegaly or extra-axial fluid collection. No mass lesion. Vascular: Please see recent CTA head and neck. Skull and upper cervical spine: Normal marrow signal. Sinuses/Orbits: Normal orbits. Clear paranasal sinuses. No mastoid effusion. Other: None. IMPRESSION: Multifocal bilateral frontoparietal infarcts predominantly involving the left cerebrum, likely embolic. These results will be called to the ordering clinician or representative by the Radiologist Assistant, and communication documented in the PACS or [employer]. Electronically Signed   By: Stana Bunting M.D.   On: 04/27/2020 14:52     Medications:     Scheduled Medications: . Chlorhexidine Gluconate Cloth  6 each Topical Daily  . coumadin book   Does not apply Once  . dapagliflozin propanediol  10 mg Oral Daily  . digoxin  0.125 mg Oral Daily  . docusate sodium  100 mg Oral Daily  . doxycycline  100 mg Oral Q12H  . furosemide  40 mg Intravenous BID  . potassium chloride  20 mEq Oral BID  . sacubitril-valsartan  1 tablet Oral BID  . sodium chloride flush  10-40 mL Intracatheter Q12H  . sodium chloride flush  3 mL Intravenous Q12H  . spironolactone  25 mg Oral Daily  . Warfarin - Pharmacist Dosing Inpatient   Does not apply q1600    Infusions: . sodium chloride    . cefTRIAXone (ROCEPHIN)  IV 200 mL/hr at 04/28/20 0700  . heparin 2,600 Units/hr (04/28/20 0700)  . milrinone 0.125 mcg/kg/min (04/28/20 0700)    PRN Medications: sodium chloride, acetaminophen, guaiFENesin-dextromethorphan, ondansetron (ZOFRAN) IV, sodium chloride flush, sodium chloride flush   Assessment/Plan   1. Acute Biventricular HF: Echo at [facility] with EF < 20%, LV thrombus noted.  He has had dyspnea, orthopnea, and pleuritic chest pain since he had COVID-19 in June. He feels like he never improved.  No family history of cardiomyopathy or premature CAD.   Suspect most likely etiology for cardiomyopathy is viral myocarditis due to COVID-19.  HIV negative. CMRI LVEF 15% RVEF 19%, LGE pattern concerning for myocarditis.  - Today, CVP 3-4 with co-ox 51%. No edema or JVD. Wt - 21 lbs since admission. - Stop lasix today - Continue milrinone 0.125 mcg/kg/min, stop today if repeat co-ox 58%.  - Continue digoxin 0.125 mg.  - Continue Entresto will increased dose tonight 49/51  - Spironolactone 25 daily. - Continue Farxiga 10 mg daily  2. ID: CT chest and CMRI  concerning for multifocal PNA. COVID-19 negative at [facility]. ?Sequelae of prior COVID-19 infection with lung scarring or bacterial PNA. Cultures NGTD x 4 days.  - Cover with ceftriaxone/doxycycline for CAP (Day # 5) 3. LV thrombus: On heparin - Discussed with neuro, transition back to heparin gtt   4. AKI: Resolved.  5. CVA: CTA head was negative.  MRI showed multifocal b/l frontoparietal infarct - A1c and lipid pending - Neuro following.  - Anticoagulation per neuro.   [name] 04/28/2020 8:56 AM

## 2020-04-28 NOTE — Progress Notes (Signed)
CARDIAC REHAB PHASE I   PRE:  Rate/Rhythm: 98 SR    BP: sitting 134/99    SaO2: 96 RA  MODE:  Ambulation: 1000 ft   POST:  Rate/Rhythm: 107 ST    BP: sitting 137/107     SaO2: 95 RA  Tolerated well. Feels better today, just endorses numbness in left shoulder area. Not SOB walking. BP elevated. Brief discussion of low sodium and daily wts. Encouraged to read HF booklet today and we will discuss tomorrow. Discussed exercise guidelines for home. Pt and family receptive.  [id number]   Harriet Masson CES, ACSM 04/28/2020 2:24 PM

## 2020-04-28 NOTE — Progress Notes (Signed)
Patient ID: Joel [name], male   DOB: 01/21/1996, 24 y.o.   MRN: [id number]     Advanced Heart Failure Rounding Note  PCP-Cardiologist: No primary care provider on file.   Subjective:    MRI head showed multifocal bilateral frontoparietal infarcts.  This morning, he still has some tingling in his shoulder but denies other residual deficit.    He remains on milrinone 0.125 this morning with co-ox 51% but drawn early am.  CVP is 2-3. SBP 130s. Good diuresis yesterday on IV Lasix.   He is on heparin gtt/warfarin for LV thrombus.   CMRI 1.  Possible left basilar PNA. 2.  Small circumferential pericardial effusion. 3. Severe LV dilation with diffuse hypokinesis, EF 15%. There is a small LV apical thrombus. 4.  Moderate RV dilation with EF 19%. 5. LGE pattern concerning for myocarditis, with diffuse patchy mid-wall LGE.  Objective:   Weight Range: 113.4 kg Body mass index is 32.09 kg/m.   Vital Signs:   Temp:  [97.8 F (36.6 C)-98.5 F (36.9 C)] 97.9 F (36.6 C) (10/28 0738) Pulse Rate:  [93-104] 104 (10/28 0806) Resp:  [14-40] 19 (10/28 0738) BP: (128-142)/(90-104) 139/99 (10/28 0738) SpO2:  [91 %-97 %] 97 % (10/28 0738) Weight:  [113.4 kg] 113.4 kg (10/28 0315) Last BM Date: 04/26/20  Weight change: Filed Weights   04/26/20 0400 04/27/20 0300 04/28/20 0315  Weight: 119.7 kg 127.4 kg 113.4 kg    Intake/Output:   Intake/Output Summary (Last 24 hours) at 04/28/2020 0930 Last data filed at 04/28/2020 0813 Gross per 24 hour  Intake 946.08 ml  Output 4325 ml  Net -3378.92 ml      Physical Exam   CVP 2-3 General: NAD Neck: No JVD, no thyromegaly or thyroid nodule.  Lungs: Clear to auscultation bilaterally with normal respiratory effort. CV: Lateral PMI.  Heart regular S1/S2, no S3/S4, no murmur.  No peripheral edema.   Abdomen: Soft, nontender, no hepatosplenomegaly, no distention.  Skin: Intact without lesions or rashes.  Neurologic: Alert and oriented  x 3.  Psych: Normal affect. Extremities: No clubbing or cyanosis.  HEENT: Normal.    Telemetry   NSR 90s personally reviewed.    Labs    CBC Recent Labs    04/27/20 0400 04/28/20 0302  WBC 8.2 6.9  HGB 14.5 15.1  HCT 44.7 47.3  MCV 90.7 91.0  PLT 228 233   Basic Metabolic Panel Recent Labs    57/84/69 0353 04/26/20 0353 04/27/20 0400 04/28/20 0302  NA 135   < > 134* 138  K 4.2   < > 4.0 3.8  CL 104   < > 103 102  CO2 23   < > 23 25  GLUCOSE 104*   < > 188* 99  BUN 25*   < > 15 14  CREATININE 1.34*   < > 1.08 1.17  CALCIUM 8.0*   < > 8.0* 8.0*  MG 1.8  --   --   --    < > = values in this interval not displayed.   Liver Function Tests No results for input(s): AST, ALT, ALKPHOS, BILITOT, PROT, ALBUMIN in the last 72 hours. No results for input(s): LIPASE, AMYLASE in the last 72 hours. Cardiac Enzymes No results for input(s): CKTOTAL, CKMB, CKMBINDEX, TROPONINI in the last 72 hours.  BNP: BNP (last 3 results) No results for input(s): BNP in the last 8760 hours.  ProBNP (last 3 results) No results for input(s): PROBNP in the last 8760  hours.   D-Dimer No results for input(s): DDIMER in the last 72 hours. Hemoglobin A1C No results for input(s): HGBA1C in the last 72 hours. Fasting Lipid Panel No results for input(s): CHOL, HDL, LDLCALC, TRIG, CHOLHDL, LDLDIRECT in the last 72 hours. Thyroid Function Tests No results for input(s): TSH, T4TOTAL, T3FREE, THYROIDAB in the last 72 hours.  Invalid input(s): FREET3  Other results:   Imaging    MR BRAIN WO CONTRAST  Result Date: 04/27/2020 CLINICAL DATA:  Stroke, follow-up. EXAM: MRI HEAD WITHOUT CONTRAST TECHNIQUE: Multiplanar, multiecho pulse sequences of the brain and surrounding structures were obtained without intravenous contrast. COMPARISON:  04/27/2020 head CT and prior. 04/26/2020 CTA head and neck. FINDINGS: Brain: Multifocal acute infarcts involving the bilateral frontoparietal regions  demonstrating left predominance. Left cerebral foci of SWI signal dropout reflect petechial hemorrhages. No midline shift, ventriculomegaly or extra-axial fluid collection. No mass lesion. Vascular: Please see recent CTA head and neck. Skull and upper cervical spine: Normal marrow signal. Sinuses/Orbits: Normal orbits. Clear paranasal sinuses. No mastoid effusion. Other: None. IMPRESSION: Multifocal bilateral frontoparietal infarcts predominantly involving the left cerebrum, likely embolic. These results will be called to the ordering clinician or representative by the Radiologist Assistant, and communication documented in the PACS or [employer]. Electronically Signed   By: Stana Bunting M.D.   On: 04/27/2020 14:52     Medications:     Scheduled Medications: . Chlorhexidine Gluconate Cloth  6 each Topical Daily  . coumadin book   Does not apply Once  . dapagliflozin propanediol  10 mg Oral Daily  . digoxin  0.125 mg Oral Daily  . docusate sodium  100 mg Oral Daily  . doxycycline  100 mg Oral Q12H  . potassium chloride  20 mEq Oral BID  . sacubitril-valsartan  1 tablet Oral BID  . sodium chloride flush  10-40 mL Intracatheter Q12H  . sodium chloride flush  3 mL Intravenous Q12H  . spironolactone  25 mg Oral Daily  . Warfarin - Pharmacist Dosing Inpatient   Does not apply q1600    Infusions: . sodium chloride    . cefTRIAXone (ROCEPHIN)  IV 200 mL/hr at 04/28/20 0700  . heparin 2,600 Units/hr (04/28/20 0700)  . milrinone 0.125 mcg/kg/min (04/28/20 0700)    PRN Medications: sodium chloride, acetaminophen, guaiFENesin-dextromethorphan, ondansetron (ZOFRAN) IV, sodium chloride flush, sodium chloride flush   Assessment/Plan   1. Acute Biventricular HF: Echo at [facility] with EF < 20%, LV thrombus noted.  He has had dyspnea, orthopnea, and pleuritic chest pain since he had COVID-19 in June. He feels like he never improved.  No family history of cardiomyopathy or premature CAD.   Suspect most likely etiology for cardiomyopathy is viral myocarditis due to COVID-19.  HIV negative. CMRI LVEF 15% RVEF 19%, LGE pattern concerning for myocarditis. Today, CVP 2-3 with co-ox 51% but drawn in early am.  SBP generally mildly elevated, creatinine stable.  - Send co-ox again, if 58%+, will stop milrinone.  - Stop IV Lasix today.   - Continue digoxin 0.125 mg.  - Increase Entresto to 49/51 bid.   - Spironolactone 25 daily. - Continue Farxiga 10 mg daily  2. ID: CT chest and CMRI  concerning for multifocal PNA. COVID-19 negative at [place]. ?Sequelae of prior COVID-19 infection with lung scarring or bacterial PNA. Cultures NGTD.  - Covering with ceftriaxone/doxycycline for CAP.   3. LV thrombus: On heparin gtt/warfarin overlap.    4. AKI: Resolved.   5. CVA: Cardioembolic.  Seems  to have minimal residual symptoms (tingling left upper arm). - Neuro following.  - heparin gtt + warfarin with LV thrombus.  - PT/OT.   [name] 04/28/2020 9:30 AM

## 2020-04-28 NOTE — Plan of Care (Signed)
  Problem: Education: Goal: Knowledge of General Education information will improve Description: Including pain rating scale, medication(s)/side effects and non-pharmacologic comfort measures Outcome: Progressing   Problem: Health Behavior/Discharge Planning: Goal: Ability to manage health-related needs will improve Outcome: Progressing   Problem: Clinical Measurements: Goal: Ability to maintain clinical measurements within normal limits will improve Outcome: Progressing Goal: Will remain free from infection Outcome: Progressing Goal: Diagnostic test results will improve Outcome: Progressing Goal: Respiratory complications will improve Outcome: Progressing Goal: Cardiovascular complication will be avoided Outcome: Progressing   Problem: Activity: Goal: Risk for activity intolerance will decrease Outcome: Progressing   Problem: Nutrition: Goal: Adequate nutrition will be maintained Outcome: Progressing   Problem: Coping: Goal: Level of anxiety will decrease Outcome: Progressing   Problem: Elimination: Goal: Will not experience complications related to bowel motility Outcome: Progressing Goal: Will not experience complications related to urinary retention Outcome: Progressing   Problem: Pain Managment: Goal: General experience of comfort will improve Outcome: Progressing   Problem: Safety: Goal: Ability to remain free from injury will improve Outcome: Progressing   Problem: Skin Integrity: Goal: Risk for impaired skin integrity will decrease Outcome: Progressing   Problem: Education: Goal: Ability to demonstrate management of disease process will improve Outcome: Progressing Goal: Ability to verbalize understanding of medication therapies will improve Outcome: Progressing Goal: Individualized Educational Video(s) Outcome: Progressing   Problem: Activity: Goal: Capacity to carry out activities will improve Outcome: Progressing   Problem: Cardiac: Goal:  Ability to achieve and maintain adequate cardiopulmonary perfusion will improve Outcome: Progressing   Problem: Activity: Goal: Ability to tolerate increased activity will improve Outcome: Progressing   Problem: Clinical Measurements: Goal: Ability to maintain a body temperature in the normal range will improve Outcome: Progressing   Problem: Respiratory: Goal: Ability to maintain adequate ventilation will improve Outcome: Progressing Goal: Ability to maintain a clear airway will improve Outcome: Progressing   Problem: Education: Goal: Knowledge of disease or condition will improve Outcome: Progressing Goal: Knowledge of secondary prevention will improve Outcome: Progressing Goal: Knowledge of patient specific risk factors addressed and post discharge goals established will improve Outcome: Progressing   Problem: Coping: Goal: Will verbalize positive feelings about self Outcome: Progressing Goal: Will identify appropriate support needs Outcome: Progressing   Problem: Health Behavior/Discharge Planning: Goal: Ability to manage health-related needs will improve Outcome: Progressing   

## 2020-04-28 NOTE — Evaluation (Signed)
Occupational Therapy Evaluation Patient Details Name: Joel [name] MRN: [id number] DOB: [birth year] Today's Date: 04/28/2020    History of Present Illness 24 y.o. male recent COVID-19 infection developed viral myocarditis and acute systolic CHF and known  LV thrombus switched to LMWH 04/26/20 noted to have acute onset right arm flaccid paralysis and right leg weakness which resolved. 04/27/20 had additional bout of tingling in L upper arm   Clinical Impression   Pt is overall functioning at a modified independent to independent level in ADL. He reports "forgetting" how to he holds an eating utensil in the R hand, but is able to feed himself without assist. Pt continues to have altered sensation when L side of head down the front of his shoulder, but only with touch. 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    Follow Up Recommendations   (TBD after further cognitive testing)    Equipment Recommendations  None recommended by OT    Recommendations for Other Services       Precautions / Restrictions Precautions Precautions: None Restrictions Weight Bearing Restrictions: No      Mobility Bed Mobility Overal bed mobility: Independent                  Transfers Overall transfer level: Independent Equipment used: None                  Balance Overall balance assessment: Independent                               Standardized Balance Assessment Standardized Balance Assessment : Dynamic Gait Index   Dynamic Gait Index Level Surface: Normal Change in Gait Speed: Normal Gait with Horizontal Head Turns: Normal Gait with Vertical Head Turns: Normal Gait and Pivot Turn: Normal Step Over Obstacle: Normal Step Around Obstacles: Normal Steps: Normal Total Score: 24     ADL either performed or assessed with clinical judgement   ADL  Overall ADL's : Independent;Modified independent                                             Vision Baseline Vision/History: No visual deficits Patient Visual Report: No change from baseline       Perception     Praxis      Pertinent Vitals/Pain Pain Assessment: No/denies pain     Hand Dominance Right   Extremity/Trunk Assessment Upper Extremity Assessment Upper Extremity Assessment: RUE deficits/detail;LUE deficits/detail RUE Deficits / Details: 5/5, intact coordination RUE Coordination: WNL (reports he "forgot" how he holds a fork, very subtle posturi) LUE Deficits / Details: change in sensation with touch to side of head down front of chest, 5/5 strength LUE Coordination: WNL   Lower Extremity Assessment Lower Extremity Assessment: Overall WFL for tasks assessed   Cervical / Trunk Assessment Cervical / Trunk Assessment: Other exceptions   Communication Communication Communication: Expressive difficulties (mild word finding deficits)   Cognition Arousal/Alertness: Awake/alert Behavior During Therapy: WFL for tasks assessed/performed Overall Cognitive Status: Impaired/Different from baseline Area of Impairment: Problem solving;Memory                     Memory: Decreased short-term memory       Problem  Solving: Slow processing;Decreased initiation;Difficulty sequencing;Requires verbal cues General Comments: cognition needs further testing for functional implications, per pt he is not very academic   General Comments       Exercises     Shoulder Instructions      Home Living Family/patient expects to be discharged to:: Private residence Living Arrangements: Other relatives Available Help at Discharge: Family;Available 24 hours/day Type of Home: House Home Access: Level entry     Home Layout: One level     Bathroom Shower/Tub: Tub/shower unit;Curtain   Bathroom Toilet: Standard Bathroom Accessibility: Yes   Home  Equipment: Bedside commode;Walker - 2 wheels;Cane - single point          Prior Functioning/Environment Level of Independence: Independent        Comments: drives, works as a Gaffer List: Decreased cognition;Impaired sensation      OT Treatment/Interventions: Cognitive remediation/compensation    OT Goals(Current goals can be found in the care plan section) Acute Rehab OT Goals Patient Stated Goal: go hunting OT Goal Formulation: With patient Time For Goal Achievement: 05/12/20 Potential to Achieve Goals: Good ADL Goals Additional ADL Goal #1: Pt will participate in cognitive assessment to determine functional deficits. Additional ADL Goal #2: Pt will be aware of any cognitive deficits and compensatory strategies.  OT Frequency: Min 2X/week   Barriers to D/C:            Co-evaluation              AM-PAC OT "6 Clicks" Daily Activity     Outcome Measure Help from another person eating meals?: None Help from another person taking care of personal grooming?: None Help from another person toileting, which includes using toliet, bedpan, or urinal?: None Help from another person bathing (including washing, rinsing, drying)?: None Help from another person to put on and taking off regular upper body clothing?: None Help from another person to put on and taking off regular lower body clothing?: None 6 Click Score: 24   End of Session    Activity Tolerance: Patient tolerated treatment well Patient left: in bed;with call bell/phone within reach;with family/visitor present  OT Visit Diagnosis: Other symptoms and signs involving cognitive function                Time: 1545-1615 OT Time Calculation (min): 30 min Charges:  OT General Charges $OT Visit: 1 Visit OT Evaluation $OT Eval Low Complexity: 1 Low OT Treatments $Self Care/Home Management : 8-22 mins  Martie Round, OTR/L Acute Rehabilitation Services Pager: [phone] Office:  [phone]  Evern Bio 04/28/2020, 4:46 PM

## 2020-04-28 NOTE — Progress Notes (Signed)
ANTICOAGULATION CONSULT NOTE   Pharmacy Consult for lovenox>heparin and warfarin Indication: LV thrombus  No Known Allergies  Patient Measurements: Height: 6\' 2"  (188 cm) Weight: 113.4 kg (249 lb 14.4 oz) IBW/kg (Calculated) : 82.2 Heparin Dosing Weight: 108.7 kg  Vital Signs: Temp: 97.8 F (36.6 C) (10/28 0315) Temp Source: Oral (10/28 0315) BP: 139/92 (10/28 0315) Pulse Rate: 98 (10/28 0315)  Labs: Recent Labs    04/26/20 0353 04/26/20 0353 04/26/20 0745 04/26/20 0745 04/27/20 0400 04/27/20 0930 04/27/20 1600 04/27/20 1822 04/28/20 0302 04/28/20 0609  HGB 15.2   < > 15.2   < > 14.5  --   --   --  15.1  --   HCT 47.1   < > 46.2  --  44.7  --   --   --  47.3  --   PLT 248   < > 270  --  228  --   --   --  233  --   APTT  --   --  38*  --   --   --   --   --   --   --   LABPROT 15.7*   < > 14.1  --  14.0  --   --   --   --  13.7  INR 1.3*   < > 1.1  --  1.1  --   --   --   --  1.1  HEPARINUNFRC  --   --  <0.10*   < >  --   --  >2.20* 0.47  --  0.53  HEPRLOWMOCWT  --   --   --   --   --  0.23  --   --   --   --   CREATININE 1.34*  --   --   --  1.08  --   --   --  1.17  --    < > = values in this interval not displayed.    Estimated Creatinine Clearance: 132.7 mL/min (by C-G formula based on SCr of 1.17 mg/dL).   Medical History: Past Medical History:  Diagnosis Date  . Anxiety   . Chronic systolic CHF 04/24/2020   Echo at [facility] 04/2020: EF < 20, mild to mod MR, LV thrombus  . Depression   . Dilated cardiomyopathy (HCC)   . History of COVID-19   . Hypertension   . LV (left ventricular) mural thrombus     Medications:  Scheduled:  . Chlorhexidine Gluconate Cloth  6 each Topical Daily  . coumadin book   Does not apply Once  . dapagliflozin propanediol  10 mg Oral Daily  . digoxin  0.125 mg Oral Daily  . docusate sodium  100 mg Oral Daily  . doxycycline  100 mg Oral Q12H  . furosemide  40 mg Intravenous BID  . potassium chloride  20 mEq Oral  BID  . sacubitril-valsartan  1 tablet Oral BID  . sodium chloride flush  10-40 mL Intracatheter Q12H  . sodium chloride flush  3 mL Intravenous Q12H  . spironolactone  25 mg Oral Daily  . Warfarin - Pharmacist Dosing Inpatient   Does not apply q1600    Assessment: 24 yof transferred [facility] with SOB and hemoptysis for 1 month. ECHO found LVEF<20% and LV thrombus.   Code stroke 10/26 and again 10/27 am. MRI showed multifocal b/l frontoparietal infarcts. After discussion with neurology and cardiology felt it was safer to change back  to heparin for reversal purposes.   Heparin levels have been at goal overnight, now at upper end of stroke protocol goal this morning. No bleeding issues noted. CBC stable. INR normal and unchanged at 1.1, will give extra warfarin.     Goal of Therapy:  INR goal 2-3 Heparin level 0.3-0.7 units/ml Monitor platelets by anticoagulation protocol: Yes   Plan:  Reduce IV heparin to 2550 Warfarin 15mg  tonight Daily INR  PharmD., BCPS Clinical Pharmacist 04/28/2020 7:20 AM  [facility] pharmacy phone numbers are listed on [email or web address]

## 2020-04-29 ENCOUNTER — Other Ambulatory Visit (HOSPITAL_COMMUNITY): Payer: Self-pay | Admitting: Cardiology

## 2020-04-29 LAB — CULTURE, BLOOD (ROUTINE X 2)
Culture: NO GROWTH
Culture: NO GROWTH
Special Requests: ADEQUATE

## 2020-04-29 LAB — HEPARIN LEVEL (UNFRACTIONATED): Heparin Unfractionated: 0.41 IU/mL (ref 0.30–0.70)

## 2020-04-29 LAB — PROTIME-INR
INR: 1.6 — ABNORMAL HIGH (ref 0.8–1.2)
Prothrombin Time: 18.2 seconds — ABNORMAL HIGH (ref 11.4–15.2)

## 2020-04-29 LAB — COOXEMETRY PANEL
Carboxyhemoglobin: 0.9 % (ref 0.5–1.5)
Carboxyhemoglobin: 0.8 % (ref 0.5–1.5)
Carboxyhemoglobin: 0.9 % (ref 0.5–1.5)
Methemoglobin: 0.7 % (ref 0.0–1.5)
Methemoglobin: 0.6 % (ref 0.0–1.5)
Methemoglobin: 0.8 % (ref 0.0–1.5)
O2 Saturation: 85.9 %
O2 Saturation: 54.2 %
O2 Saturation: 63.3 %
Total hemoglobin: 17.4 g/dL — ABNORMAL HIGH (ref 12.0–16.0)
Total hemoglobin: 17.5 g/dL — ABNORMAL HIGH (ref 12.0–16.0)
Total hemoglobin: 17.1 g/dL — ABNORMAL HIGH (ref 12.0–16.0)

## 2020-04-29 LAB — BASIC METABOLIC PANEL
Anion gap: 7 (ref 5–15)
BUN: 15 mg/dL (ref 6–20)
CO2: 24 mmol/L (ref 22–32)
Calcium: 8.6 mg/dL — ABNORMAL LOW (ref 8.9–10.3)
Chloride: 103 mmol/L (ref 98–111)
Creatinine, Ser: 1.06 mg/dL (ref 0.61–1.24)
GFR, Estimated: >60 mL/min (ref >60)
Glucose, Bld: 110 mg/dL — ABNORMAL HIGH (ref 70–99)
Potassium: 4.7 mmol/L (ref 3.5–5.1)
Sodium: 134 mmol/L — ABNORMAL LOW (ref 135–145)

## 2020-04-29 LAB — MAGNESIUM: Magnesium: 2.1 mg/dL (ref 1.7–2.4)

## 2020-04-29 LAB — CBC
HCT: 51.7 % (ref 39.0–52.0)
Hemoglobin: 16.9 g/dL (ref 13.0–17.0)
MCH: 29.4 pg (ref 26.0–34.0)
MCHC: 32.7 g/dL (ref 30.0–36.0)
MCV: 89.9 fL (ref 80.0–100.0)
Platelets: 280 10*3/uL (ref 150–400)
RBC: 5.75 MIL/uL (ref 4.22–5.81)
RDW: 14.2 % (ref 11.5–15.5)
WBC: 11.8 10*3/uL — ABNORMAL HIGH (ref 4.0–10.5)
nRBC: 0 % (ref 0.0–0.2)

## 2020-04-29 MED ORDER — SACUBITRIL-VALSARTAN 97-103 MG PO TABS
1.0000 | ORAL_TABLET | Freq: Two times a day (BID) | ORAL | Status: DC
  Administered 2020-04-29 – 2020-05-01 (×5): 1 via ORAL
  Filled 2020-04-29 (×6): qty 1

## 2020-04-29 MED ORDER — ATORVASTATIN CALCIUM 10 MG PO TABS
10.0000 mg | ORAL_TABLET | Freq: Every day | ORAL | 6 refills | Status: DC
Start: 2020-04-30 — End: 2020-11-04

## 2020-04-29 MED ORDER — WARFARIN SODIUM 10 MG PO TABS
10.0000 mg | ORAL_TABLET | Freq: Once | ORAL | Status: AC
  Administered 2020-04-29: 10 mg via ORAL
  Filled 2020-04-29: qty 1

## 2020-04-29 MED ORDER — DAPAGLIFLOZIN PROPANEDIOL 10 MG PO TABS
10.0000 mg | ORAL_TABLET | Freq: Every day | ORAL | 6 refills | Status: DC
Start: 2020-04-30 — End: 2020-04-30

## 2020-04-29 MED ORDER — BACID PO TABS
2.0000 | ORAL_TABLET | Freq: Three times a day (TID) | ORAL | Status: DC
  Administered 2020-04-29 – 2020-05-01 (×7): 2 via ORAL
  Filled 2020-04-29 (×9): qty 2

## 2020-04-29 MED ORDER — SACUBITRIL-VALSARTAN 97-103 MG PO TABS: 1.0000 | ORAL_TABLET | Freq: Two times a day (BID) | ORAL | 6 refills | Status: DC

## 2020-04-29 MED ORDER — DIGOXIN 125 MCG PO TABS
0.1250 mg | ORAL_TABLET | Freq: Every day | ORAL | 6 refills | Status: DC
Start: 2020-04-30 — End: 2020-11-04

## 2020-04-29 MED ORDER — SPIRONOLACTONE 25 MG PO TABS
25.0000 mg | ORAL_TABLET | Freq: Every day | ORAL | 6 refills | Status: DC
Start: 2020-04-30 — End: 2020-11-04

## 2020-04-29 MED ORDER — WARFARIN SODIUM 5 MG PO TABS: 10.0000 mg | ORAL_TABLET | Freq: Every day | ORAL | 6 refills | Status: DC

## 2020-04-29 MED FILL — SPIRONOLACTONE 25 MG TABLET: 25 | 30 days supply | Qty: 30 | Fill #0

## 2020-04-29 MED FILL — FARXIGA 10 MG TABLET: 10 | 30 days supply | Qty: 30 | Fill #0

## 2020-04-29 MED FILL — DIGOXIN 0.125 MG TABLET: 125 | 30 days supply | Qty: 30 | Fill #0

## 2020-04-29 MED FILL — WARFARIN SODIUM 5 MG TABLET: 5 | 30 days supply | Qty: 60 | Fill #0

## 2020-04-29 MED FILL — ENTRESTO 97 MG-103 MG TAB: 97-103 | 30 days supply | Qty: 60 | Fill #0

## 2020-04-29 MED FILL — ATORVASTATIN CALCIUM 10 MG: 10 | 30 days supply | Qty: 30 | Fill #0

## 2020-04-29 NOTE — Plan of Care (Signed)
  Problem: Education: Goal: Knowledge of General Education information will improve Description: Including pain rating scale, medication(s)/side effects and non-pharmacologic comfort measures Outcome: Progressing   Problem: Health Behavior/Discharge Planning: Goal: Ability to manage health-related needs will improve Outcome: Progressing   Problem: Clinical Measurements: Goal: Ability to maintain clinical measurements within normal limits will improve Outcome: Progressing Goal: Will remain free from infection Outcome: Progressing Goal: Diagnostic test results will improve Outcome: Progressing Goal: Respiratory complications will improve Outcome: Progressing Goal: Cardiovascular complication will be avoided Outcome: Progressing   Problem: Activity: Goal: Risk for activity intolerance will decrease Outcome: Progressing   Problem: Nutrition: Goal: Adequate nutrition will be maintained Outcome: Progressing   Problem: Coping: Goal: Level of anxiety will decrease Outcome: Progressing   Problem: Elimination: Goal: Will not experience complications related to bowel motility Outcome: Progressing Goal: Will not experience complications related to urinary retention Outcome: Progressing   Problem: Pain Managment: Goal: General experience of comfort will improve Outcome: Progressing   Problem: Safety: Goal: Ability to remain free from injury will improve Outcome: Progressing   Problem: Skin Integrity: Goal: Risk for impaired skin integrity will decrease Outcome: Progressing   Problem: Education: Goal: Ability to demonstrate management of disease process will improve Outcome: Progressing Goal: Ability to verbalize understanding of medication therapies will improve Outcome: Progressing Goal: Individualized Educational Video(s) Outcome: Progressing   Problem: Activity: Goal: Capacity to carry out activities will improve Outcome: Progressing   Problem: Cardiac: Goal:  Ability to achieve and maintain adequate cardiopulmonary perfusion will improve Outcome: Progressing   Problem: Activity: Goal: Ability to tolerate increased activity will improve Outcome: Progressing   Problem: Clinical Measurements: Goal: Ability to maintain a body temperature in the normal range will improve Outcome: Progressing   Problem: Respiratory: Goal: Ability to maintain adequate ventilation will improve Outcome: Progressing Goal: Ability to maintain a clear airway will improve Outcome: Progressing   Problem: Education: Goal: Knowledge of disease or condition will improve Outcome: Progressing Goal: Knowledge of secondary prevention will improve Outcome: Progressing Goal: Knowledge of patient specific risk factors addressed and post discharge goals established will improve Outcome: Progressing   Problem: Coping: Goal: Will verbalize positive feelings about self Outcome: Progressing Goal: Will identify appropriate support needs Outcome: Progressing   Problem: Health Behavior/Discharge Planning: Goal: Ability to manage health-related needs will improve Outcome: Progressing

## 2020-04-29 NOTE — Progress Notes (Signed)
Outpatient CSW consulted to meet with pt and help with medication assistance applications.  [employer] application and [employer] application patient portion completed with patient at bedside.  CSW will have MD complete prescriber portion and send in for review.  Burna Sis, LCSW Clinical Social Worker Advanced [facility] Desk#: [phone] Cell#: [phone]

## 2020-04-29 NOTE — Progress Notes (Signed)
CARDIAC REHAB PHASE I   PRE:  Rate/Rhythm: 91SR  BP:  Supine: 140/106  Sitting:   Standing:    SaO2: 96%RA  MODE:  Ambulation: 800 ft   POST:  Rate/Rhythm: 116 ST  BP:  Supine:   Sitting: 144/111, 143/114  Standing:    SaO2: 96%RA 0921-1000 Pt walked 800 ft on RA with steady gait and tolerated well. No DOE noted. Encouraged pt to take more walks today. Pt has CHF booklet and low sodium handouts. Pt could answer teach back questions of when to call MD with weight gain. Discussed reading labels and adhering to 2000 mg sodium. BP elevated before and after walk. Has not received morning meds yet. Notified RN of elevated BP.   Luetta Nutting, RN BSN  04/29/2020 9:57 AM

## 2020-04-29 NOTE — Discharge Instructions (Signed)

## 2020-04-29 NOTE — Progress Notes (Signed)
ANTICOAGULATION CONSULT NOTE   Pharmacy Consult for lovenox>heparin and warfarin Indication: LV thrombus  No Known Allergies  Patient Measurements: Height: 6\' 2"  (188 cm) Weight: 111.4 kg (245 lb 9.5 oz) IBW/kg (Calculated) : 82.2 Heparin Dosing Weight: 108.7 kg  Vital Signs: Temp: 97.7 F (36.5 C) (10/29 1048) Temp Source: Oral (10/29 1048) BP: 132/105 (10/29 1048) Pulse Rate: 93 (10/29 1048)  Labs: Recent Labs    04/27/20 0400 04/27/20 0400 04/27/20 0930 04/27/20 1600 04/27/20 1822 04/28/20 0302 04/28/20 0609 04/29/20 0932  HGB 14.5   < >  --   --   --  15.1  --  16.9  HCT 44.7  --   --   --   --  47.3  --  51.7  PLT 228  --   --   --   --  233  --  280  LABPROT 14.0  --   --   --   --   --  13.7 18.2*  INR 1.1  --   --   --   --   --  1.1 1.6*  HEPARINUNFRC  --   --   --    < > 0.47  --  0.53 0.41  HEPRLOWMOCWT  --   --  0.23  --   --   --   --   --   CREATININE 1.08  --   --   --   --  1.17  --  1.06   < > = values in this interval not displayed.    Estimated Creatinine Clearance: 145.2 mL/min (by C-G formula based on SCr of 1.06 mg/dL).   Medical History: Past Medical History:  Diagnosis Date  . Anxiety   . Cerebral embolism with cerebral infarction 04/28/2020  . Chronic systolic CHF 04/24/2020   Echo at [facility] 04/2020: EF < 20, mild to mod MR, LV thrombus  . Depression   . Dilated cardiomyopathy (HCC)   . History of COVID-19   . Hypertension   . LV (left ventricular) mural thrombus     Medications:  Scheduled:  . atorvastatin  10 mg Oral Daily  . Chlorhexidine Gluconate Cloth  6 each Topical Daily  . coumadin book   Does not apply Once  . dapagliflozin propanediol  10 mg Oral Daily  . digoxin  0.125 mg Oral Daily  . docusate sodium  100 mg Oral Daily  . influenza vac split quadrivalent PF  0.5 mL Intramuscular Tomorrow-1000  . lactobacillus acidophilus  2 tablet Oral TID  . potassium chloride  20 mEq Oral BID  . sacubitril-valsartan   1 tablet Oral BID  . sodium chloride flush  10-40 mL Intracatheter Q12H  . sodium chloride flush  3 mL Intravenous Q12H  . spironolactone  25 mg Oral Daily  . warfarin  10 mg Oral ONCE-1600  . Warfarin - Pharmacist Dosing Inpatient   Does not apply q1600    Assessment: 24 yof transferred from [facility] with SOB and hemoptysis for 1 month. ECHO found LVEF<20% and LV thrombus.   Code stroke 10/26 and again 10/27 am. MRI showed multifocal b/l frontoparietal infarcts. After discussion with neurology and cardiology felt it was safer to change back to heparin for reversal purposes.   Heparin levels have been at goal. No bleeding issues noted. CBC stable. INR trending up to 1.6.     Goal of Therapy:  INR goal 2-3 Heparin level 0.3-0.7 units/ml Monitor platelets by anticoagulation protocol: Yes  Plan:  IV heparin at 2550 Warfarin 10mg  tonight Daily INR  PharmD., BCPS Clinical Pharmacist 04/29/2020 12:59 PM  [facility] pharmacy phone numbers are listed on [email or web address]

## 2020-04-29 NOTE — Progress Notes (Addendum)
STROKE TEAM PROGRESS NOTE   INTERVAL HISTORY Patient is sitting up in bed. No complaints. Neurological exam unchanged. He knows he is at high risk for sleep apnoea but has not been tested for it.He is interested in participating in [facility] stroke prevention study.Vital signs stable. He remains on heparin drip and warfarin and INR is 1.6  Vitals:   04/29/20 0441 04/29/20 0455 04/29/20 0804 04/29/20 1016  BP: (!) 153/107  (!) 124/96   Pulse: 100  97 (!) 106  Resp: 20  18   Temp: 98.1 F (36.7 C)  98 F (36.7 C)   TempSrc: Oral  Oral   SpO2: 100%  97%   Weight:  111.4 kg    Height:       CBC:  Recent Labs  Lab 04/28/20 0302 04/29/20 0932  WBC 6.9 11.8*  HGB 15.1 16.9  HCT 47.3 51.7  MCV 91.0 89.9  PLT 233 280   Basic Metabolic Panel:  Recent Labs  Lab 04/26/20 0353 04/27/20 0400 04/28/20 0302 04/29/20 0932  NA 135   < > 138 134*  K 4.2   < > 3.8 4.7  CL 104   < > 102 103  CO2 23   < > 25 24  GLUCOSE 104*   < > 99 110*  BUN 25*   < > 14 15  CREATININE 1.34*   < > 1.17 1.06  CALCIUM 8.0*   < > 8.0* 8.6*  MG 1.8  --   --  2.1   < > = values in this interval not displayed.   Lipid Panel:  Recent Labs  Lab 04/28/20 0830  CHOL 128  TRIG 89  HDL 25*  CHOLHDL 5.1  VLDL 18  LDLCALC 85   HgbA1c:  Recent Labs  Lab 04/28/20 0830  HGBA1C 5.8*   Urine Drug Screen: No results for input(s): LABOPIA, COCAINSCRNUR, LABBENZ, AMPHETMU, THCU, LABBARB in the last 168 hours.  Alcohol Level No results for input(s): ETH in the last 168 hours.  IMAGING past 24 hours No results found.  PHYSICAL EXAM   Pleasant young obese Caucasian male not in distress. . Afebrile. Head is nontraumatic. Neck is supple without bruit.    Cardiac exam no murmur or gallop. Lungs are clear to auscultation. Distal pulses are well felt. Neurological Exam ;  Awake  Alert oriented x 3. Normal speech and language.eye movements full without nystagmus.fundi were not visualized. Vision acuity and  fields appear normal. Hearing is normal. Palatal movements are normal. Face symmetric. Tongue midline. Normal strength, tone, reflexes and coordination except mild right grip weakness and diminished fine motor skills on the right.  Orbits left over right upper extremity.  Right mild right lower extremity hip flexor weakness 4/5.Marland Kitchen Normal sensation. Gait deferred.  NIHSS o Premorbid MRS 1 ASSESSMENT/PLAN [name] is a 24 y.o. male with history of recent COVID-19 infection who developed myocarditis with acute systolic CHF and LV thrombus who was switched to LMWH the night before developing R arm flaccidity and R leg weakness in the hospital.   Stroke:   Multiple B frontoparietal infarcts  Embolic in setting of LV clot post COVID infection  Code Stroke CT head 10/25 No acute abnormality.  ASPECTS 10.     CTA head & neck Unremarkable  Code Stroke CT head 10/27 small L parietal cortical / subcortical infarcts.  ASPECTS 9  MRI  Multiple B frontoparietal infarct involving L cerebram  2D Echo at [facility] w/ EF  20% and LV clot  LDL 85  HgbA1c 5.8  VTE prophylaxis - IV heparin  on heparin IV as bridge to warfarin.   Therapy recommendations:  No PT  Disposition:  Return home  Expect good resolution of stroke symptoms  LV mural thrombus  Acute biventricular heart failure post COVID w/ EF 20% Dilated cardiomyopathy Likely d/t myocarditis post COVID  Cardiology managing  On IV heparin as bridge to warfarin  INR 1.6, goal 2-3  On enestro - SW helping w/ meds  Hyperlipidemia  Home meds:  No statin  LDL 85, goal < 70  Add lipitor 10 - will not do high intensity d/t non-atherosclerotic source of stroke  Continue statin at discharge  Other Stroke Risk Factors  Former Cigarette smoker, states he has quit  Smokeless tobacco user (chew) advised to stop using d/t stroke risk  Hx ETOH use, advised to drink no more than 2 drink(s) a day  Obesity, Body mass  index is 31.53 kg/m., recommend weight loss, diet and exercise as appropriate   Family hx stroke (father)  Hx COVID in June 2021  Snores, likely obstructive sleep apnea, consider for SleepSmart study vs traditional OP workup (need to check age range for study qualification as he is so young)   Other Active Problems  Depression on buspar, lexapro  AKI resolved  At risk for Obstructive Sleep apnoea  Hospital day # 5 Continue iv heparin bridge till INR between 2-3. Patient interested in participating in Sleep smart study and will be given info to review and decide.I have spent a total of 25   minutes with the patient reviewing hospital notes,  test results, labs and examining the patient as well as establishing an assessment and plan that was discussed personally with the patient.  > 50% of time was spent in direct patient care.      Delia Heady, MD To contact Stroke Continuity provider, please refer to [email or web address]. After hours, contact General Neurology

## 2020-04-29 NOTE — Progress Notes (Signed)
  Speech Language Pathology Treatment: Cognitive-Linquistic  Patient Details Name: Joel [name] MRN: [id number] DOB: [birth year] Today's Date: 04/29/2020 Time: 1540-1600 SLP Time Calculation (min) (ACUTE ONLY): 20 min  Assessment / Plan / Recommendation Clinical Impression  Pt was seen for treatment with his grandparents present. He was alert and cooperative throughout the session. Pt reported that believes his language and cognition are back to baseline. These reports were corroborated by this family. The session was completed in a more informal manner due to pt's reported anxiety regarding tasks which remind him of school. He was able to recall multiple events and discussions from throughout the day regarding his plan of care. He participated in conversation without evidence of aphasia. He completed executive function tasks with 100% accuracy and additional processing time was not required. 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 Pt's language and cognitive-linguistic deficits appear to have resolved; further skilled SLP services are not clinically indicated at this time.   HPI HPI: Pt is a 24 y.o. male recent COVID-56 infection developed viral myocarditis and acute systolic CHF and known  LV thrombus switched to LMWH and noted to have acute onset right arm flaccid paralysis and right leg weakness. MRI brain: Multifocal bilateral frontoparietal infarcts predominantly involving the left cerebrum, likely embolic.      SLP Plan  All goals met;Discharge SLP treatment due to (comment)       Recommendations                   Follow up Recommendations: None SLP Visit Diagnosis: Cognitive communication deficit (R41.841) Plan: All goals met;Discharge SLP treatment due to (comment)       Carlisle Enke I. Hardin Negus, [place], [place] Office number [phone] Pager  [place] 04/29/2020, 4:09 PM

## 2020-04-29 NOTE — Progress Notes (Addendum)
Patient ID: Joel [name], male   DOB: 1995/09/09, 24 y.o.   MRN: [id number]     Advanced Heart Failure Rounding Note  PCP-Cardiologist: No primary care provider on file.   Subjective:    MRI head showed multifocal bilateral frontoparietal infarcts.   CMRI 1.  Possible left basilar PNA. 2.  Small circumferential pericardial effusion. 3. Severe LV dilation with diffuse hypokinesis, EF 15%. There is a small LV apical thrombus. 4.  Moderate RV dilation with EF 19%. 5. LGE pattern concerning for myocarditis, with diffuse patchy mid-wall LGE.  He remains on milrinone 0.125 this morning with co-ox 80% which is a jump from 48% yesterday afternoon.  RN redrawing.  CVP is 2-3. SBP 130-150s. Tele showed 10 beats of NSVT this AM.  A/W BMP/Mg.  Today, denies CP, SOB, dizziness. Denies headache.  Was having abdominal cramping last night we he attributes to diet yesterday.  Denies N/V.  Has intermittent tingling to LUE.   He is on heparin gtt/warfarin for LV thrombus.   Objective:   Weight Range: 111.4 kg Body mass index is 31.53 kg/m.   Vital Signs:   Temp:  [97.8 F (36.6 C)-98.2 F (36.8 C)] 98 F (36.7 C) (10/29 0804) Pulse Rate:  [95-100] 97 (10/29 0804) Resp:  [18-20] 18 (10/29 0804) BP: (124-153)/(96-111) 124/96 (10/29 0804) SpO2:  [94 %-100 %] 97 % (10/29 0804) Weight:  [111.4 kg] 111.4 kg (10/29 0455) Last BM Date: 04/29/20  Weight change: Filed Weights   04/27/20 0300 04/28/20 0315 04/29/20 0455  Weight: 127.4 kg 113.4 kg 111.4 kg    Intake/Output:   Intake/Output Summary (Last 24 hours) at 04/29/2020 0843 Last data filed at 04/29/2020 0700 Gross per 24 hour  Intake 1013.98 ml  Output 3600 ml  Net -2586.02 ml      Physical Exam   CVP 2-3 General:  Well appearing. No resp difficulty HEENT: normal Neck: supple. no JVD. Carotids 2+ bilat; no bruits. No lymphadenopathy appreciated. Cor: Regular rate & rhythm. No rubs, gallops or murmurs. Lungs:  clear Abdomen: soft, nontender, nondistended.No bruits or masses. Good bowel sounds. Extremities: no cyanosis, clubbing, edema.  Rash noted to L bicep.  Neuro: alert & orientedx3, cranial nerves grossly intact. moves all 4 extremities w/o difficulty. BUE/BLE strength 5/5. Affect pleasant    Telemetry   NSR with 10 beats of NSVT this AM at 8:35.  Personally reviewed.    Labs    CBC Recent Labs    04/27/20 0400 04/28/20 0302  WBC 8.2 6.9  HGB 14.5 15.1  HCT 44.7 47.3  MCV 90.7 91.0  PLT 228 233   Basic Metabolic Panel Recent Labs    09/38/18 0400 04/28/20 0302  NA 134* 138  K 4.0 3.8  CL 103 102  CO2 23 25  GLUCOSE 188* 99  BUN 15 14  CREATININE 1.08 1.17  CALCIUM 8.0* 8.0*   Liver Function Tests No results for input(s): AST, ALT, ALKPHOS, BILITOT, PROT, ALBUMIN in the last 72 hours. No results for input(s): LIPASE, AMYLASE in the last 72 hours. Cardiac Enzymes No results for input(s): CKTOTAL, CKMB, CKMBINDEX, TROPONINI in the last 72 hours.  BNP: BNP (last 3 results) No results for input(s): BNP in the last 8760 hours.  ProBNP (last 3 results) No results for input(s): PROBNP in the last 8760 hours.   D-Dimer No results for input(s): DDIMER in the last 72 hours. Hemoglobin A1C Recent Labs    04/28/20 0830  HGBA1C 5.8*   Fasting  Lipid Panel Recent Labs    04/28/20 0830  CHOL 128  HDL 25*  LDLCALC 85  TRIG 89  CHOLHDL 5.1   Thyroid Function Tests No results for input(s): TSH, T4TOTAL, T3FREE, THYROIDAB in the last 72 hours.  Invalid input(s): FREET3  Other results:   Imaging    No results found.   Medications:     Scheduled Medications: . atorvastatin  10 mg Oral Daily  . Chlorhexidine Gluconate Cloth  6 each Topical Daily  . coumadin book   Does not apply Once  . dapagliflozin propanediol  10 mg Oral Daily  . digoxin  0.125 mg Oral Daily  . docusate sodium  100 mg Oral Daily  . doxycycline  100 mg Oral Q12H  . influenza vac  split quadrivalent PF  0.5 mL Intramuscular Tomorrow-1000  . potassium chloride  20 mEq Oral BID  . sacubitril-valsartan  1 tablet Oral BID  . sodium chloride flush  10-40 mL Intracatheter Q12H  . sodium chloride flush  3 mL Intravenous Q12H  . spironolactone  25 mg Oral Daily  . Warfarin - Pharmacist Dosing Inpatient   Does not apply q1600    Infusions: . sodium chloride    . cefTRIAXone (ROCEPHIN)  IV 200 mL/hr at 04/29/20 0700  . heparin 2,550 Units/hr (04/29/20 0700)  . milrinone 0.125 mcg/kg/min (04/29/20 0700)    PRN Medications: sodium chloride, acetaminophen, guaiFENesin-dextromethorphan, ondansetron (ZOFRAN) IV, sodium chloride flush, sodium chloride flush   Assessment/Plan   1. Acute Biventricular HF: Echo at [facility] with EF < 20%, LV thrombus noted.  He has had dyspnea, orthopnea, and pleuritic chest pain since he had COVID-19 in June. He feels like he never improved.  No family history of cardiomyopathy or premature CAD.  Suspect most likely etiology for cardiomyopathy is viral myocarditis due to COVID-19.  HIV negative. CMRI LVEF 15% RVEF 19%, LGE pattern concerning for myocarditis.  Today, CVP 2-3 with co-ox 80% but drawn in early am.   - SBP generally mildly elevated, creatinine pending.  - Send co-ox again, stop milrinone.  - Stop IV Lasix 10/29  - Continue digoxin 0.125 mg.  - Increase Entresto to 97/103 bid.   - Spironolactone 25 daily. - Continue Farxiga 10 mg daily  2. ID: CT chest and CMRI  concerning for multifocal PNA. COVID-19 negative at [place]. ?Sequelae of prior COVID-19 infection with lung scarring or bacterial PNA. Cultures NGTD.  - Ceftriaxone/doxycycline for CAP to stop today. 3. LV thrombus: On heparin gtt/warfarin overlap.    4. AKI: Resolved.   5. CVA: Cardioembolic.  Seems to have minimal residual symptoms (tingling left upper arm).  - Neuro following.  - heparin gtt + warfarin with LV thrombus.  - PT/OT.   [name] 04/29/2020 8:43 AM  Patient seen with NP, agree with the above note.   No complaints this morning, has walked with cardiac rehab and feels good.  Labs apparently got lost today and being resent.  CVP 2-3 with co-ox 80%.   General: NAD Neck: No JVD, no thyromegaly or thyroid nodule.  Lungs: Clear to auscultation bilaterally with normal respiratory effort. CV: Nondisplaced PMI.  Heart regular S1/S2, no S3/S4, no murmur.  No peripheral edema.   Abdomen: Soft, nontender, no hepatosplenomegaly, no distention.  Skin: Intact without lesions or rashes.  Neurologic: Alert and oriented x 3.  Psych: Normal affect. Extremities: No clubbing or cyanosis.  HEENT: Normal.   Stop milrinone today, repeat co-ox.  Increase Entresto to 97/103  bid with ongoing hypertension.  Continues on heparin gtt/warfarin bridge to therapeutic INR given LV thrombus with cardioembolic CVAs.  When INR is therapeutic, he will likely be ready to go home.  Will need close followup.   [name] 04/29/2020 9:47 AM

## 2020-04-29 NOTE — Progress Notes (Signed)
Occupational Therapy Treatment and Discharge Patient Details Name: Joel [name] MRN: [id number] DOB: 05/02/1996 Today's Date: 04/29/2020    History of present illness 24 y.o. male recent COVID-19 infection developed viral myocarditis and acute systolic CHF and known  LV thrombus switched to LMWH 04/26/20 noted to have acute onset right arm flaccid paralysis and right leg weakness which resolved. 04/27/20 had additional bout of tingling in L upper arm   OT comments  Administered pill box test with pt making no errors, but requiring 8 minutes to complete instead of 5. Session with multiple distractions, but did not impede accuracy. Pt with normal fine motor skills for assessment. Pt is functioning independent in ADL and mobility. No further OT needs.  Follow Up Recommendations  No OT follow up    Equipment Recommendations  None recommended by OT    Recommendations for Other Services      Precautions / Restrictions Precautions Precautions: None       Mobility Bed Mobility                  Transfers Overall transfer level: Independent                    Balance Overall balance assessment: Independent                                         ADL either performed or assessed with clinical judgement   ADL Overall ADL's : Independent                                             Vision       Perception     Praxis      Cognition Arousal/Alertness: Awake/alert Behavior During Therapy: WFL for tasks assessed/performed Overall Cognitive Status: Within Functional Limits for tasks assessed                                 General Comments: pt recalled previous session and forthcoming pill box test, administered pill box test with pt making no errors, but requiring 8 minutes to complete        Exercises     Shoulder Instructions       General Comments      Pertinent Vitals/ Pain       Pain  Assessment: No/denies pain  Home Living                                          Prior Functioning/Environment              Frequency           Progress Toward Goals  OT Goals(current goals can now be found in the care plan section)  Progress towards OT goals: Progressing toward goals  Acute Rehab OT Goals Patient Stated Goal: go hunting OT Goal Formulation: With patient Time For Goal Achievement: 05/12/20 Potential to Achieve Goals: Good  Plan Discharge plan needs to be updated    Co-evaluation                 AM-PAC OT "  6 Clicks" Daily Activity     Outcome Measure   Help from another person eating meals?: None Help from another person taking care of personal grooming?: None Help from another person toileting, which includes using toliet, bedpan, or urinal?: None Help from another person bathing (including washing, rinsing, drying)?: None Help from another person to put on and taking off regular upper body clothing?: None Help from another person to put on and taking off regular lower body clothing?: None 6 Click Score: 24    End of Session        Activity Tolerance Patient tolerated treatment well   Patient Left in bed;with call bell/phone within reach;with nursing/sitter in room   Nurse Communication          Time: 7544-9201 OT Time Calculation (min): 21 min  Charges: OT General Charges $OT Visit: 1 Visit OT Treatments $Cognitive Funtion inital: Initial 15 mins  Martie Round, OTR/L Acute Rehabilitation Services Pager: [phone] Office: [phone]   Evern Bio 04/29/2020, 11:47 AM

## 2020-04-30 DIAGNOSIS — E7800 Pure hypercholesterolemia, unspecified: Secondary | ICD-10-CM

## 2020-04-30 DIAGNOSIS — I6340 Cerebral infarction due to embolism of unspecified cerebral artery: Secondary | ICD-10-CM

## 2020-04-30 LAB — CBC
HCT: 53.8 % — ABNORMAL HIGH (ref 39.0–52.0)
Hemoglobin: 17.3 g/dL — ABNORMAL HIGH (ref 13.0–17.0)
MCH: 28.9 pg (ref 26.0–34.0)
MCHC: 32.2 g/dL (ref 30.0–36.0)
MCV: 90 fL (ref 80.0–100.0)
Platelets: 269 10*3/uL (ref 150–400)
RBC: 5.98 MIL/uL — ABNORMAL HIGH (ref 4.22–5.81)
RDW: 14.4 % (ref 11.5–15.5)
WBC: 8.7 10*3/uL (ref 4.0–10.5)
nRBC: 0 % (ref 0.0–0.2)

## 2020-04-30 LAB — BASIC METABOLIC PANEL
Anion gap: 8 (ref 5–15)
BUN: 12 mg/dL (ref 6–20)
CO2: 23 mmol/L (ref 22–32)
Calcium: 8.9 mg/dL (ref 8.9–10.3)
Chloride: 105 mmol/L (ref 98–111)
Creatinine, Ser: 0.98 mg/dL (ref 0.61–1.24)
GFR, Estimated: >60 mL/min (ref >60)
Glucose, Bld: 103 mg/dL — ABNORMAL HIGH (ref 70–99)
Potassium: 4.5 mmol/L (ref 3.5–5.1)
Sodium: 136 mmol/L (ref 135–145)

## 2020-04-30 LAB — PROTIME-INR
INR: 2.4 — ABNORMAL HIGH (ref 0.8–1.2)
Prothrombin Time: 25.4 seconds — ABNORMAL HIGH (ref 11.4–15.2)

## 2020-04-30 LAB — HEPARIN LEVEL (UNFRACTIONATED): Heparin Unfractionated: 0.92 IU/mL — ABNORMAL HIGH (ref 0.30–0.70)

## 2020-04-30 LAB — COOXEMETRY PANEL
Carboxyhemoglobin: 0.7 % (ref 0.5–1.5)
Methemoglobin: 0.8 % (ref 0.0–1.5)
O2 Saturation: 52.8 %
Total hemoglobin: 17.9 g/dL — ABNORMAL HIGH (ref 12.0–16.0)

## 2020-04-30 MED ORDER — WARFARIN SODIUM 5 MG PO TABS
5.0000 mg | ORAL_TABLET | Freq: Once | ORAL | Status: AC
  Administered 2020-04-30: 5 mg via ORAL
  Filled 2020-04-30: qty 1

## 2020-04-30 MED ORDER — CARVEDILOL 3.125 MG PO TABS
3.1250 mg | ORAL_TABLET | Freq: Two times a day (BID) | ORAL | Status: DC
  Administered 2020-04-30: 3.125 mg via ORAL
  Filled 2020-04-30: qty 1

## 2020-04-30 MED ORDER — FUROSEMIDE 40 MG PO TABS
40.0000 mg | ORAL_TABLET | Freq: Every day | ORAL | Status: DC
  Administered 2020-04-30 – 2020-05-01 (×2): 40 mg via ORAL
  Filled 2020-04-30 (×2): qty 1

## 2020-04-30 NOTE — Progress Notes (Signed)
STROKE TEAM PROGRESS NOTE   INTERVAL HISTORY Patient grandparents are at the bedside. Pt no neuro complains, except left face and chest skin decreased light touch sensation. Moving all extremities. Off heparin drip and INR today 2.4.   Vitals:   04/29/20 2022 04/29/20 2330 04/30/20 0442 04/30/20 0500  BP: (!) 141/97 (!) 130/100 (!) 137/104   Pulse: 95 91 96   Resp: 20 20 18    Temp: 98 F (36.7 C) 98.4 F (36.9 C) 97.7 F (36.5 C)   TempSrc: Oral Oral Oral   SpO2: 96% 98% 97%   Weight:    109.6 kg  Height:       CBC:  Recent Labs  Lab 04/29/20 0932 04/30/20 0457  WBC 11.8* 8.7  HGB 16.9 17.3*  HCT 51.7 53.8*  MCV 89.9 90.0  PLT 280 269   Basic Metabolic Panel:  Recent Labs  Lab 04/26/20 0353 04/27/20 0400 04/29/20 0932 04/30/20 0457  NA 135   < > 134* 136  K 4.2   < > 4.7 4.5  CL 104   < > 103 105  CO2 23   < > 24 23  GLUCOSE 104*   < > 110* 103*  BUN 25*   < > 15 12  CREATININE 1.34*   < > 1.06 0.98  CALCIUM 8.0*   < > 8.6* 8.9  MG 1.8  --  2.1  --    < > = values in this interval not displayed.   Lipid Panel:  Recent Labs  Lab 04/28/20 0830  CHOL 128  TRIG 89  HDL 25*  CHOLHDL 5.1  VLDL 18  LDLCALC 85   HgbA1c:  Recent Labs  Lab 04/28/20 0830  HGBA1C 5.8*   Urine Drug Screen: No results for input(s): LABOPIA, COCAINSCRNUR, LABBENZ, AMPHETMU, THCU, LABBARB in the last 168 hours.  Alcohol Level No results for input(s): ETH in the last 168 hours.  IMAGING past 24 hours No results found.  PHYSICAL EXAM   Pleasant young obese Caucasian male not in distress. . Afebrile. Head is nontraumatic. Neck is supple without bruit.    Cardiac exam no murmur or gallop. Lungs are clear to auscultation. Distal pulses are well felt. Neurological Exam ;  Awake  Alert oriented x 3. Normal speech and language.eye movements full without nystagmus. fundi were not visualized. Vision acuity and fields appear normal. Hearing is normal. Palatal movements are normal. Face  symmetric. Tongue midline. Normal strength, tone, reflexes and coordination. Normal sensation except left facial and left chest decreased light touch sensation. Gait deferred.  NIHSS o Premorbid MRS 1 ASSESSMENT/PLAN [name] is a 24 y.o. male with history of recent COVID-19 infection who developed myocarditis with acute systolic CHF and LV thrombus who was switched to LMWH the night before developing R arm flaccidity and R leg weakness in the hospital.   Stroke:  Multiple B frontoparietal infarcts at left MCA and b/l ACAs, embolic in setting of LV clot secondary to myocarditis post COVID infection  Code Stroke CT head 10/25 No acute abnormality.  ASPECTS 10.     CTA head & neck Unremarkable  CT head 10/27 small L parietal cortical / subcortical infarcts.  ASPECTS 9  MRI  Multiple B frontoparietal infarct - left MCA and b/l ACAs  2D Echo at [facility] w/ EF 20% and LV clot  LDL 85  HgbA1c 5.8  VTE prophylaxis - coumadin  off heparin IV now, on warfarin with INR 2.4.  Therapy recommendations:  No PT  Disposition:  Return home  Expect good resolution of stroke symptoms  LV mural thrombus  Acute biventricular heart failure post COVID w/ EF 20% Dilated cardiomyopathy Likely d/t myocarditis post COVID  Cardiology on board  Was on IV heparin as bridge to warfarin, now off  On coumadin, INR 1.6->2.4, goal 2-3  On enestro - SW helping w/ meds  Hyperlipidemia  Home meds:  No statin  LDL 85, goal < 70  On lipitor 10 - will not do high intensity d/t non-atherosclerotic source of stroke  Continue statin at discharge  Other Stroke Risk Factors  Former Cigarette smoker, states he has quit  Smokeless tobacco user (chew) advised to stop using d/t stroke risk  Hx ETOH use, advised to drink no more than 2 drink(s) a day  Obesity, Body mass index is 31.02 kg/m., recommend weight loss, diet and exercise as appropriate   Family hx stroke  (father)  Hx COVID in June 2021  Snores, likely obstructive sleep apnea, evaluation for SleepSmart study  Other Active Problems  Depression on buspar, lexapro  AKI, resolved  At risk for Obstructive Sleep apnoea  Hospital day # 6  Neurology will sign off. Please call with questions. Pt will follow up with stroke clinic Dr. Pearlean Brownie at [facility] in about 4 weeks. Thanks for the consult.  Marvel Plan, MD PhD Stroke Neurology 04/30/2020 1:57 PM   To contact Stroke Continuity provider, please refer to [email or web address]. After hours, contact General Neurology

## 2020-04-30 NOTE — Progress Notes (Signed)
ANTICOAGULATION CONSULT NOTE   Pharmacy Consult for lovenox>heparin and warfarin Indication: LV thrombus  No Known Allergies  Patient Measurements: Height: 6\' 2"  (188 cm) Weight: 109.6 kg (241 lb 10 oz) IBW/kg (Calculated) : 82.2 Heparin Dosing Weight: 108.7 kg  Vital Signs: Temp: 98.2 F (36.8 C) (10/30 0738) Temp Source: Oral (10/30 0738) BP: 122/90 (10/30 0738) Pulse Rate: 93 (10/30 0738)  Labs: Recent Labs    04/27/20 1822 04/28/20 0302 04/28/20 0302 04/28/20 0609 04/29/20 0932 04/30/20 0457  HGB  --  15.1   < >  --  16.9 17.3*  HCT  --  47.3  --   --  51.7 53.8*  PLT  --  233  --   --  280 269  LABPROT  --   --   --  13.7 18.2* 25.4*  INR  --   --   --  1.1 1.6* 2.4*  HEPARINUNFRC   < >  --   --  0.53 0.41 0.92*  CREATININE  --  1.17  --   --  1.06 0.98   < > = values in this interval not displayed.    Estimated Creatinine Clearance: 155.9 mL/min (by C-G formula based on SCr of 0.98 mg/dL).   Medical History: Past Medical History:  Diagnosis Date   Anxiety    Cerebral embolism with cerebral infarction 04/28/2020   Chronic systolic CHF 04/24/2020   Echo at [facility] 04/2020: EF < 20, mild to mod MR, LV thrombus   Depression    Dilated cardiomyopathy (HCC)    History of COVID-19    Hypertension    LV (left ventricular) mural thrombus     Medications:  Scheduled:   atorvastatin  10 mg Oral Daily   carvedilol  3.125 mg Oral BID WC   Chlorhexidine Gluconate Cloth  6 each Topical Daily   coumadin book   Does not apply Once   dapagliflozin propanediol  10 mg Oral Daily   digoxin  0.125 mg Oral Daily   docusate sodium  100 mg Oral Daily   furosemide  40 mg Oral Daily   influenza vac split quadrivalent PF  0.5 mL Intramuscular Tomorrow-1000   lactobacillus acidophilus  2 tablet Oral TID   sacubitril-valsartan  1 tablet Oral BID   sodium chloride flush  10-40 mL Intracatheter Q12H   sodium chloride flush  3 mL Intravenous Q12H    spironolactone  25 mg Oral Daily   warfarin  5 mg Oral ONCE-1600   Warfarin - Pharmacist Dosing Inpatient   Does not apply q1600    Assessment: 24 yof transferred from [facility] with SOB and hemoptysis for 1 month. ECHO found LVEF<20% and LV thrombus.   Code stroke 10/26 and again 10/27 am. MRI showed multifocal b/l frontoparietal infarcts. After discussion with neurology and cardiology felt it was safer to change back to heparin for reversal purposes.   Heparin drip 2550 uts/hr, Heparin level elevated today 0.9 > goal but stop now with INR 2.4 at goal. No bleeding issues noted. CBC stable. INR big jump over last 2 days 1.1>1.6>2.4 - will back down dose tonight and reevaluate in am   Goal of Therapy:  INR goal 2-3 Heparin level 0.3-0.7 units/ml Monitor platelets by anticoagulation protocol: Yes   Plan:  Stop heparin drip  Warfarin 5mg  tonight Daily INR  11/27 Pharm.D. CPP, BCPS Clinical Pharmacist [phone] 04/30/2020 9:43 AM   [facility] pharmacy phone numbers are listed on [email or web address]

## 2020-04-30 NOTE — Progress Notes (Signed)
Patient ID: Joel [name], male   DOB: 16-Jan-1996, 24 y.o.   MRN: [id number]     Advanced Heart Failure Rounding Note  PCP-Cardiologist: No primary care provider on file.   Subjective:    MRI head showed multifocal bilateral frontoparietal infarcts.   CMRI 1.  Possible left basilar PNA. 2.  Small circumferential pericardial effusion. 3. Severe LV dilation with diffuse hypokinesis, EF 15%. There is a small LV apical thrombus. 4.  Moderate RV dilation with EF 19%. 5. LGE pattern concerning for myocarditis, with diffuse patchy mid-wall LGE.  He is now off milrinone, feels good.  CVP 6 this morning. BP still runs high.   He is on heparin gtt/warfarin for LV thrombus. INR up to 2.4.  Objective:   Weight Range: 109.6 kg Body mass index is 31.02 kg/m.   Vital Signs:   Temp:  [97.7 F (36.5 C)-98.4 F (36.9 C)] 98.2 F (36.8 C) (10/30 0738) Pulse Rate:  [90-106] 93 (10/30 0738) Resp:  [18-24] 18 (10/30 0738) BP: (122-141)/(90-105) 122/90 (10/30 0738) SpO2:  [96 %-99 %] 97 % (10/30 0738) Weight:  [109.6 kg] 109.6 kg (10/30 0500) Last BM Date: 04/30/20  Weight change: Filed Weights   04/28/20 0315 04/29/20 0455 04/30/20 0500  Weight: 113.4 kg 111.4 kg 109.6 kg    Intake/Output:   Intake/Output Summary (Last 24 hours) at 04/30/2020 0926 Last data filed at 04/30/2020 0738 Gross per 24 hour  Intake 528.73 ml  Output 2000 ml  Net -1471.27 ml      Physical Exam   CVP 6 General: Well appearing this am. NAD.  HEENT: Normal. Neck: Supple, JVP 7-8 cm. Carotids OK.  Cardiac:  Mechanical heart sounds with LVAD hum present.  Lungs:  CTAB, normal effort.  Abdomen:  NT, ND, no HSM. No bruits or masses. +BS  LVAD exit site: Well-healed and incorporated. Dressing dry and intact. No erythema or drainage. Stabilization device present and accurately applied. Driveline dressing changed daily per sterile technique. Extremities:  Warm and dry. No cyanosis, clubbing, rash,  or edema.  Neuro:  Alert & oriented x 3. Cranial nerves grossly intact. Moves all 4 extremities w/o difficulty. Affect pleasant     Telemetry   NSR 90s-100s.  Personally reviewed.    Labs    CBC Recent Labs    04/29/20 0932 04/30/20 0457  WBC 11.8* 8.7  HGB 16.9 17.3*  HCT 51.7 53.8*  MCV 89.9 90.0  PLT 280 269   Basic Metabolic Panel Recent Labs    62/22/97 0932 04/30/20 0457  NA 134* 136  K 4.7 4.5  CL 103 105  CO2 24 23  GLUCOSE 110* 103*  BUN 15 12  CREATININE 1.06 0.98  CALCIUM 8.6* 8.9  MG 2.1  --    Liver Function Tests No results for input(s): AST, ALT, ALKPHOS, BILITOT, PROT, ALBUMIN in the last 72 hours. No results for input(s): LIPASE, AMYLASE in the last 72 hours. Cardiac Enzymes No results for input(s): CKTOTAL, CKMB, CKMBINDEX, TROPONINI in the last 72 hours.  BNP: BNP (last 3 results) No results for input(s): BNP in the last 8760 hours.  ProBNP (last 3 results) No results for input(s): PROBNP in the last 8760 hours.   D-Dimer No results for input(s): DDIMER in the last 72 hours. Hemoglobin A1C Recent Labs    04/28/20 0830  HGBA1C 5.8*   Fasting Lipid Panel Recent Labs    04/28/20 0830  CHOL 128  HDL 25*  LDLCALC 85  TRIG 89  CHOLHDL 5.1   Thyroid Function Tests No results for input(s): TSH, T4TOTAL, T3FREE, THYROIDAB in the last 72 hours.  Invalid input(s): FREET3  Other results:   Imaging    No results found.   Medications:     Scheduled Medications: . atorvastatin  10 mg Oral Daily  . carvedilol  3.125 mg Oral BID WC  . Chlorhexidine Gluconate Cloth  6 each Topical Daily  . coumadin book   Does not apply Once  . dapagliflozin propanediol  10 mg Oral Daily  . digoxin  0.125 mg Oral Daily  . docusate sodium  100 mg Oral Daily  . influenza vac split quadrivalent PF  0.5 mL Intramuscular Tomorrow-1000  . lactobacillus acidophilus  2 tablet Oral TID  . sacubitril-valsartan  1 tablet Oral BID  . sodium  chloride flush  10-40 mL Intracatheter Q12H  . sodium chloride flush  3 mL Intravenous Q12H  . spironolactone  25 mg Oral Daily  . Warfarin - Pharmacist Dosing Inpatient   Does not apply q1600    Infusions: . sodium chloride      PRN Medications: sodium chloride, acetaminophen, guaiFENesin-dextromethorphan, ondansetron (ZOFRAN) IV, sodium chloride flush, sodium chloride flush   Assessment/Plan   1. Acute Biventricular HF: Echo at [facility] with EF < 20%, LV thrombus noted.  He has had dyspnea, orthopnea, and pleuritic chest pain since he had COVID-19 in June. He feels like he never improved.  No family history of cardiomyopathy or premature CAD.  HIV negative. CMRI LVEF 15%, RVEF 19%, LGE pattern concerning for myocarditis. Suspect most likely etiology for cardiomyopathy is viral myocarditis due to COVID-19.  CVP 6 today, he is now off milrinone.  BP remains elevated.  - Start on Lasix 40 mg daily for home.  - Continue digoxin 0.125 mg.  - Continue Entresto 97/103 bid.   - Spironolactone 25 daily. - Continue Farxiga 10 mg daily  - Add Coreg 3.125 mg bid.  2. ID: CT chest and CMRI  concerning for multifocal PNA. COVID-19 negative at [place]. ?Sequelae of prior COVID-19 infection with lung scarring or bacterial PNA. Cultures NGTD.  He completed a course of ceftriaxone/doxycycline for CAP.  3. LV thrombus: On heparin gtt/warfarin overlap. INR 2.4 today.  - Can stop IV heparin.  4. AKI: Resolved.   5. CVA: Cardioembolic.  Seems to have minimal residual symptoms (tingling left upper arm).  - Neuro following.  - heparin gtt + warfarin with LV thrombus => stop heparin today.  - PT/OT.  6. Suspect OSA: Sleep study as outpatient.   If he remains stable overnight, aim for home tomorrow.   [name] 04/30/2020 9:26 AM

## 2020-05-01 ENCOUNTER — Encounter: Payer: Self-pay | Admitting: Physician Assistant

## 2020-05-01 LAB — BASIC METABOLIC PANEL
Anion gap: 12 (ref 5–15)
BUN: 13 mg/dL (ref 6–20)
CO2: 17 mmol/L — ABNORMAL LOW (ref 22–32)
Calcium: 9.3 mg/dL (ref 8.9–10.3)
Chloride: 104 mmol/L (ref 98–111)
Creatinine, Ser: 1.06 mg/dL (ref 0.61–1.24)
GFR, Estimated: >60 mL/min (ref >60)
Glucose, Bld: 107 mg/dL — ABNORMAL HIGH (ref 70–99)
Potassium: 4.9 mmol/L (ref 3.5–5.1)
Sodium: 133 mmol/L — ABNORMAL LOW (ref 135–145)

## 2020-05-01 LAB — CBC
HCT: 55.1 % — ABNORMAL HIGH (ref 39.0–52.0)
Hemoglobin: 18 g/dL — ABNORMAL HIGH (ref 13.0–17.0)
MCH: 29.2 pg (ref 26.0–34.0)
MCHC: 32.7 g/dL (ref 30.0–36.0)
MCV: 89.4 fL (ref 80.0–100.0)
Platelets: 260 10*3/uL (ref 150–400)
RBC: 6.16 MIL/uL — ABNORMAL HIGH (ref 4.22–5.81)
RDW: 14.6 % (ref 11.5–15.5)
WBC: 11.5 10*3/uL — ABNORMAL HIGH (ref 4.0–10.5)
nRBC: 0 % (ref 0.0–0.2)

## 2020-05-01 LAB — PROTIME-INR
INR: 2.2 — ABNORMAL HIGH (ref 0.8–1.2)
Prothrombin Time: 23.8 seconds — ABNORMAL HIGH (ref 11.4–15.2)

## 2020-05-01 LAB — COOXEMETRY PANEL
Carboxyhemoglobin: 0.7 % (ref 0.5–1.5)
Methemoglobin: 0.6 % (ref 0.0–1.5)
O2 Saturation: 61 %
Total hemoglobin: 17.4 g/dL — ABNORMAL HIGH (ref 12.0–16.0)

## 2020-05-01 MED ORDER — CARVEDILOL 6.25 MG PO TABS: 6.2500 mg | ORAL_TABLET | Freq: Two times a day (BID) | ORAL | 11 refills | Status: DC

## 2020-05-01 MED ORDER — WARFARIN SODIUM 5 MG PO TABS: 10.0000 mg | ORAL_TABLET | Freq: Every day | ORAL | 6 refills | Status: DC

## 2020-05-01 MED ORDER — FUROSEMIDE 40 MG PO TABS: 40.0000 mg | ORAL_TABLET | Freq: Every day | ORAL | 11 refills | Status: DC

## 2020-05-01 MED ORDER — CARVEDILOL 6.25 MG PO TABS
6.2500 mg | ORAL_TABLET | Freq: Two times a day (BID) | ORAL | Status: DC
  Administered 2020-05-01: 6.25 mg via ORAL
  Filled 2020-05-01: qty 1

## 2020-05-01 NOTE — Progress Notes (Signed)
Pt got discharged to home, discharge instructions provided and patient showed understanding to it, IV taken out,Telemonitor DC,pt left unit in wheelchair with all of the belongings accompanied with a family member (Sister)  Chrisoula Zegarra, RN 

## 2020-05-01 NOTE — Discharge Summary (Signed)
Discharge Summary    Patient ID: Joel [name] MRN: [id number]; DOB: [birth year]  Admit date: 04/24/2020 Discharge date: 05/01/2020  Primary Care Provider: Patient, No Pcp Per  Primary Cardiologist: No primary care provider on file.  Primary Electrophysiologist:  None  Advanced Heart Failure: [name], MD   Discharge Diagnoses    Principal Problem:   Acute systolic CHF (congestive heart failure) (HCC) Active Problems:   LV (left ventricular) mural thrombus without MI (HCC)   Dilated cardiomyopathy (HCC)   History of COVID-19   Multifocal pneumonia   Cerebral embolism with cerebral infarction   Allergies No Known Allergies  Diagnostic Studies/Procedures    See complete radiology results below  MRI head showed multifocal bilateral frontoparietal infarcts.   CMRI 1. Possible left basilar PNA. 2. Small circumferential pericardial effusion. 3. Severe LV dilation with diffuse hypokinesis, EF 15%. There is a small LV apical thrombus. 4. Moderate RV dilation with EF 19%. 5. LGE pattern concerning for myocarditis, with diffuse patchy mid-wall LGE. _____________   History of Present Illness     Joel [name] is a 24 y.o. male with a hx of hypertension, anxiety and prior COVID-19 infection in June in 2021 who presented to [facility] with shortness of breath and hemoptysis x 1 month.  An echocardiogram demonstrated EF < 20% and an LV thrombus.  A CT scan demonstrated multifocal pneumonia.  He was started on Furosemide and IV Heparin and transferred to [facility] 10/24 for further evaluation and management.  Hospital Course     Consultants: None  1. Acute Biventricular HF: Echo at [facility] with EF <20%, LV thrombus noted. He has had dyspnea, orthopnea, and pleuritic chest pain since he had COVID-19 in June. He feels like he never improved. No family history of cardiomyopathy or premature CAD.  HIV negative. CMRI LVEF 15%, RVEF 19%, LGE pattern  concerning for myocarditis. Suspect most likely etiology for cardiomyopathy is viral myocarditis due to COVID-19.  CVP 5 today, he is now off milrinone with co-ox 62%.  BP controlled. - Continue Lasix 40 mg daily.  - Continue digoxin 0.125 mg.  - Continue Entresto 97/103 bid.   - Spironolactone 25 daily. - Continue Farxiga 10 mg daily  - Increase Coreg to 6.25 mg bid.   2. ID: CT chest and CMRI  concerning for multifocal PNA. COVID-19 negative at [place]. ?Sequelae of prior COVID-19 infection with lung scarring or bacterial PNA.  Blood cultures negative x2.  Respiratory culture with rare gram-positive cocci, no WBCs. He completed a course of ceftriaxone/doxycycline for CAP.  He remained afebrile with only minimal elevation in his WBCs.  3. LV thrombus: On warfarin, INR 2.2 today.   4. AKI: BUN/creatinine on admission 26/1.78, steadily improved, discharge labs below   5. CVA: Cardioembolic.  Seems to have minimal residual symptoms (tingling left upper arm). Therapeutic INR on warfarin.    6. Suspect OSA: Sleep study as outpatient.   7. Disposition: Home today.  Will need followup in coumadin clinic at [facility] office.  Followup CHF clinic 7-10 days.  Meds for home: Lasix 40 mg daily, digoxin 0.125 daily, Entresto 97/103 bid, warfarin per coumadin clinic, spironolactone 25 mg daily, Farxiga 10 mg daily, Coreg 6.25 mg bid, atorvastatin 10 mg daily.   _____________  Discharge Vitals Blood pressure 103/82, pulse 91, temperature 97.8 F (36.6 C), temperature source Oral, resp. rate 19, height 6\' 2"  (1.88 m), weight 108.9 kg, SpO2 98 %.  Filed Weights   04/29/20 0455  04/30/20 0500 05/01/20 0649  Weight: 111.4 kg 109.6 kg 108.9 kg    Labs & Radiologic Studies    CBC Recent Labs    04/30/20 0457 05/01/20 0136  WBC 8.7 11.5*  HGB 17.3* 18.0*  HCT 53.8* 55.1*  MCV 90.0 89.4  PLT 269 260   Basic Metabolic Panel Recent Labs    40/98/11 0932 04/29/20 0932  04/30/20 0457 05/01/20 0136  NA 134*   < > 136 133*  K 4.7   < > 4.5 4.9  CL 103   < > 105 104  CO2 24   < > 23 17*  GLUCOSE 110*   < > 103* 107*  BUN 15   < > 12 13  CREATININE 1.06   < > 0.98 1.06  CALCIUM 8.6*   < > 8.9 9.3  MG 2.1  --   --   --    < > = values in this interval not displayed.   Magnesium  Date Value Ref Range Status  04/29/2020 2.1 1.7 - 2.4 mg/dL Final    Comment:    Performed at [facility], [address] [place], [place] [address]   Hemoglobin A1C Lab Results  Component Value Date   HGBA1C 5.8 (H) 04/28/2020    Fasting Lipid Panel Lab Results  Component Value Date   CHOL 128 04/28/2020   HDL 25 (L) 04/28/2020   LDLCALC 85 04/28/2020   TRIG 89 04/28/2020   CHOLHDL 5.1 04/28/2020    Thyroid Function Tests Lab Results  Component Value Date   TSH 2.890 04/25/2020   Lab Results  Component Value Date   INR 2.2 (H) 05/01/2020   INR 2.4 (H) 04/30/2020   INR 1.6 (H) 04/29/2020    _____________  CT Code Stroke CTA Head W/WO contrast  Result Date: 04/26/2020 CLINICAL DATA:  Right-sided weakness EXAM: CT ANGIOGRAPHY HEAD AND NECK TECHNIQUE: Multidetector CT imaging of the head and neck was performed using the standard protocol during bolus administration of intravenous contrast. Multiplanar CT image reconstructions and MIPs were obtained to evaluate the vascular anatomy. Carotid stenosis measurements (when applicable) are obtained utilizing NASCET criteria, using the distal internal carotid diameter as the denominator. CONTRAST:  75mL OMNIPAQUE IOHEXOL 350 MG/ML SOLN COMPARISON:  None. FINDINGS: CTA NECK FINDINGS SKELETON: There is no bony spinal canal stenosis. No lytic or blastic lesion. OTHER NECK: Normal pharynx, larynx and major salivary glands. No cervical lymphadenopathy. Unremarkable thyroid gland. UPPER CHEST: No pneumothorax or pleural effusion. No nodules or masses. AORTIC ARCH: There is no calcific atherosclerosis of the aortic arch.  There is no aneurysm, dissection or hemodynamically significant stenosis of the visualized portion of the aorta. Conventional 3 vessel aortic branching pattern. The visualized proximal subclavian arteries are widely patent. RIGHT CAROTID SYSTEM: Normal without aneurysm, dissection or stenosis. LEFT CAROTID SYSTEM: Normal without aneurysm, dissection or stenosis. VERTEBRAL ARTERIES: Left dominant configuration. Both origins are clearly patent. There is no dissection, occlusion or flow-limiting stenosis to the skull base (V1-V3 segments). CTA HEAD FINDINGS POSTERIOR CIRCULATION: --Vertebral arteries: Normal V4 segments. --Inferior cerebellar arteries: Normal. --Basilar artery: Normal. --Superior cerebellar arteries: Normal. --Posterior cerebral arteries (PCA): Normal. ANTERIOR CIRCULATION: --Intracranial internal carotid arteries: Normal. --Anterior cerebral arteries (ACA): Normal. Both A1 segments are present. Patent anterior communicating artery (a-comm). --Middle cerebral arteries (MCA): Normal. VENOUS SINUSES: As permitted by contrast timing, patent. ANATOMIC VARIANTS: None Review of the MIP images confirms the above findings. IMPRESSION: Normal CTA of the head and neck. Electronically  Signed   By: Deatra Robinson M.D.   On: 04/26/2020 19:40   CT Code Stroke CTA Neck W/WO contrast  Result Date: 04/26/2020 CLINICAL DATA:  Right-sided weakness EXAM: CT ANGIOGRAPHY HEAD AND NECK TECHNIQUE: Multidetector CT imaging of the head and neck was performed using the standard protocol during bolus administration of intravenous contrast. Multiplanar CT image reconstructions and MIPs were obtained to evaluate the vascular anatomy. Carotid stenosis measurements (when applicable) are obtained utilizing NASCET criteria, using the distal internal carotid diameter as the denominator. CONTRAST:  19mL OMNIPAQUE IOHEXOL 350 MG/ML SOLN COMPARISON:  None. FINDINGS: CTA NECK FINDINGS SKELETON: There is no bony spinal canal stenosis. No  lytic or blastic lesion. OTHER NECK: Normal pharynx, larynx and major salivary glands. No cervical lymphadenopathy. Unremarkable thyroid gland. UPPER CHEST: No pneumothorax or pleural effusion. No nodules or masses. AORTIC ARCH: There is no calcific atherosclerosis of the aortic arch. There is no aneurysm, dissection or hemodynamically significant stenosis of the visualized portion of the aorta. Conventional 3 vessel aortic branching pattern. The visualized proximal subclavian arteries are widely patent. RIGHT CAROTID SYSTEM: Normal without aneurysm, dissection or stenosis. LEFT CAROTID SYSTEM: Normal without aneurysm, dissection or stenosis. VERTEBRAL ARTERIES: Left dominant configuration. Both origins are clearly patent. There is no dissection, occlusion or flow-limiting stenosis to the skull base (V1-V3 segments). CTA HEAD FINDINGS POSTERIOR CIRCULATION: --Vertebral arteries: Normal V4 segments. --Inferior cerebellar arteries: Normal. --Basilar artery: Normal. --Superior cerebellar arteries: Normal. --Posterior cerebral arteries (PCA): Normal. ANTERIOR CIRCULATION: --Intracranial internal carotid arteries: Normal. --Anterior cerebral arteries (ACA): Normal. Both A1 segments are present. Patent anterior communicating artery (a-comm). --Middle cerebral arteries (MCA): Normal. VENOUS SINUSES: As permitted by contrast timing, patent. ANATOMIC VARIANTS: None Review of the MIP images confirms the above findings. IMPRESSION: Normal CTA of the head and neck. Electronically Signed   By: Deatra Robinson M.D.   On: 04/26/2020 19:40   MR BRAIN WO CONTRAST  Result Date: 04/27/2020 CLINICAL DATA:  Stroke, follow-up. EXAM: MRI HEAD WITHOUT CONTRAST TECHNIQUE: Multiplanar, multiecho pulse sequences of the brain and surrounding structures were obtained without intravenous contrast. COMPARISON:  04/27/2020 head CT and prior. 04/26/2020 CTA head and neck. FINDINGS: Brain: Multifocal acute infarcts involving the bilateral  frontoparietal regions demonstrating left predominance. Left cerebral foci of SWI signal dropout reflect petechial hemorrhages. No midline shift, ventriculomegaly or extra-axial fluid collection. No mass lesion. Vascular: Please see recent CTA head and neck. Skull and upper cervical spine: Normal marrow signal. Sinuses/Orbits: Normal orbits. Clear paranasal sinuses. No mastoid effusion. Other: None. IMPRESSION: Multifocal bilateral frontoparietal infarcts predominantly involving the left cerebrum, likely embolic. These results will be called to the ordering clinician or representative by the Radiologist Assistant, and communication documented in the PACS or [employer]. Electronically Signed   By: Stana Bunting M.D.   On: 04/27/2020 14:52   DG CHEST PORT 1 VIEW  Result Date: 04/24/2020 CLINICAL DATA:  Shortness of breath, CHF EXAM: PORTABLE CHEST 1 VIEW COMPARISON:  CT 04/23/2020 FINDINGS: Cardiomegaly is similar to prior. Mild pulmonary vascular congestion. Medial left lower lobe airspace opacity better seen on recent CT. No pleural effusion or pneumothorax. IMPRESSION: 1. Cardiomegaly with mild pulmonary vascular congestion. 2. Medial left lower lobe airspace opacity better seen on recent CT. Electronically Signed   By: Duanne Guess D.O.   On: 04/24/2020 16:37   MR CARDIAC MORPHOLOGY W WO CONTRAST  Result Date: 04/25/2020 CLINICAL DATA:  Cardiomyopathy of uncertain etiology EXAM: CARDIAC MRI TECHNIQUE: The patient was scanned on a 1.5  Tesla GE magnet. A dedicated cardiac coil was used. Functional imaging was done using Fiesta sequences. 2,3, and 4 chamber views were done to assess for RWMA's. Modified Simpson's rule using a short axis stack was used to calculate an ejection fraction on a dedicated work [employer]. The patient received 10 cc of Gadavist. After 10 minutes inversion recovery sequences were used to assess for infiltration and scar tissue. CONTRAST:  Gadavist  10 cc FINDINGS: Limited images of the lung fields showed airspace disease left base. Small circumferential pericardial effusion. Severely dilated left ventricle with normal wall thickness, diffuse hypokinesis with EF 15%. 1.8 x 1.2 LV apical thrombus noted. Moderately dilated right ventricle, EF 19%. Moderately dilated left atrium, mildly dilated right atrium. Trileaflet aortic valve, no stenosis or regurgitation. Probably mild mitral regurgitation. On delayed enhancement imaging, there appears to be patchy mid-wall late gadolinium enhancement (LGE) in the basal to mid septum and lateral wall. Measurements: LVEDV 338 mL LVSV 52 mL LVEF 15% RVEDV 254 mL RVSV 48 mL RVEF 19% IMPRESSION: 1.  Possible left basilar PNA. 2.  Small circumferential pericardial effusion. 3. Severe LV dilation with diffuse hypokinesis, EF 15%. There is a small LV apical thrombus. 4.  Moderate RV dilation with EF 19%. 5. LGE pattern concerning for myocarditis, with diffuse patchy mid-wall LGE. Dalton Mclean Electronically Signed   By: [name] M.D.   On: 04/25/2020 16:12   CT HEAD CODE STROKE WO CONTRAST  Result Date: 04/27/2020 CLINICAL DATA:  Code stroke. Stroke, follow-up. Additional history provided: Sudden onset right-sided weakness yesterday with subsequent improvement, new onset of arm tingling today. EXAM: CT HEAD WITHOUT CONTRAST TECHNIQUE: Contiguous axial images were obtained from the base of the skull through the vertex without intravenous contrast. COMPARISON:  Non-contrast head CT and CT angiogram head/neck 04/26/2020. FINDINGS: Brain: Motion degraded examination, most notably at the level of the skull base. Cerebral volume is normal. There is a small acute/early subacute cortical/subcortical ischemic infarct within the left parietal lobe (series 3, images 18-21). In retrospect, this was present on the prior head CT performed one day prior and has not significantly changed since that time. No interval acute infarct is  identified. No evidence of intracranial hemorrhage. No extra-axial fluid collection. No evidence of intracranial mass. No midline shift. Vascular: No hyperdense vessel. Skull: Normal. Negative for fracture or focal lesion. Sinuses/Orbits: Visualized orbits show no acute finding. No significant paranasal sinus disease or mastoid effusion at the imaged levels. ASPECTS [facility] Stroke Program Early CT Score) - Ganglionic level infarction (caudate, lentiform nuclei, internal capsule, insula, M1-M3 cortex): 7 - Supraganglionic infarction (M4-M6 cortex): 2 Total score (0-10 with 10 being normal): 9 These results were called by telephone at the time of interpretation on 04/27/2020 at 8:55 am to provider MCNEILL [facility] , who verbally acknowledged these results. IMPRESSION: Small acute/early subacute cortical/subcortical left parietal lobe infarct. ASPECTS is 9. No acute intracranial hemorrhage. Electronically Signed   By: Jackey Loge DO   On: 04/27/2020 08:57   CT HEAD CODE STROKE WO CONTRAST  Result Date: 04/26/2020 CLINICAL DATA:  Code stroke.  Right-sided weakness EXAM: CT HEAD WITHOUT CONTRAST TECHNIQUE: Contiguous axial images were obtained from the base of the skull through the vertex without intravenous contrast. COMPARISON:  12/09/2012 FINDINGS: Brain: There is no mass, hemorrhage or extra-axial collection. The size and configuration of the ventricles and extra-axial CSF spaces are normal. The brain parenchyma is normal, without evidence of acute or chronic infarction. Vascular: No abnormal hyperdensity  of the major intracranial arteries or dural venous sinuses. No intracranial atherosclerosis. Skull: The visualized skull base, calvarium and extracranial soft tissues are normal. Sinuses/Orbits: No fluid levels or advanced mucosal thickening of the visualized paranasal sinuses. No mastoid or middle ear effusion. The orbits are normal. ASPECTS [facility] Stroke Program Early CT Score) - Ganglionic level  infarction (caudate, lentiform nuclei, internal capsule, insula, M1-M3 cortex): 7 - Supraganglionic infarction (M4-M6 cortex): 3 Total score (0-10 with 10 being normal): 10 IMPRESSION: 1. Normal head CT. 2. ASPECTS is 10. These results were communicated to Dr. Ritta Slot at 7:10 pm on 04/26/2020 by text page via the [facility] messaging system. Electronically Signed   By: Deatra Robinson M.D.   On: 04/26/2020 19:10   [place] EKG SITE RITE  Result Date: 04/24/2020 If Site Rite image not attached, placement could not be confirmed due to current cardiac rhythm.  Disposition   Pt is being discharged home today in improved condition.  Follow-up Plans & Appointments     Follow-up Information    [facility] Follow up on 05/11/2020.   Why: 1:50  will be seeing [place] Contact information: [address] [place] [address] [phone]       [facility] [address] [facility] Follow up on 05/04/2020.   Specialty: Cardiology Why: Coumadin Clinic at 10:30  Contact information: [address], [address] [place] [address] [phone]       [facility] Follow up on 05/04/2020.   Specialty: Cardiology Why: at 08:30. Located on the 1st floor of [place]. Entrance C. Garage Code 3008 Contact information: [address] [id number] [place] [address] [phone]       Micki Riley, MD. Schedule an appointment as soon as possible for a visit in 4 week(s).   Specialties: Neurology, Radiology Contact information: [address] [place] [address] [phone]              Discharge Instructions    (HEART FAILURE PATIENTS) Call MD:  Anytime you have any of the following symptoms: 1) 3 pound weight gain in 24 hours or 5 pounds in 1 week 2) shortness of breath, with or without a dry hacking cough 3) swelling in the hands, feet  or stomach 4) if you have to sleep on extra pillows at night in order to breathe.   Complete by: As directed    Ambulatory referral to Neurology   Complete by: As directed    Follow up with Dr. Pearlean Brownie at [facility] in 4-6 weeks. Too complicated for NP to follow. Thanks.   Diet - low sodium heart healthy   Complete by: As directed    Increase activity slowly   Complete by: As directed       Discharge Medications   Allergies as of 05/01/2020   No Known Allergies     Medication List    TAKE these medications   atorvastatin 10 MG tablet Commonly known as: LIPITOR Take 1 tablet (10 mg total) by mouth daily.   busPIRone 15 MG tablet Commonly known as: BUSPAR Take 7.5 mg by mouth 2 (two) times daily.   carvedilol 6.25 MG tablet Commonly known as: COREG Take 1 tablet (6.25 mg total) by mouth 2 (two) times daily with a meal.   dapagliflozin propanediol 10 MG Tabs tablet Commonly known as: FARXIGA Take 1 tablet (10 mg total) by mouth daily.  digoxin 0.125 MG tablet Commonly known as: LANOXIN Take 1 tablet (0.125 mg total) by mouth daily.   escitalopram 10 MG tablet Commonly known as: LEXAPRO Take 10 mg by mouth daily.   Flovent HFA 110 MCG/ACT inhaler Generic drug: fluticasone Inhale 2 puffs into the lungs 2 (two) times daily.   furosemide 40 MG tablet Commonly known as: LASIX Take 1 tablet (40 mg total) by mouth daily. Start taking on: May 02, 2020   hydrOXYzine 50 MG tablet Commonly known as: ATARAX/VISTARIL Take 100 mg by mouth 3 (three) times daily as needed.   montelukast 10 MG tablet Commonly known as: SINGULAIR Take 10 mg by mouth at bedtime.   ProAir HFA 108 (90 Base) MCG/ACT inhaler Generic drug: albuterol Inhale 2 puffs into the lungs every 6 (six) hours as needed for shortness of breath.   sacubitril-valsartan 97-103 MG Commonly known as: ENTRESTO Take 1 tablet by mouth 2 (two) times daily.   spironolactone 25 MG tablet Commonly known as:  ALDACTONE Take 1 tablet (25 mg total) by mouth daily.   warfarin 5 MG tablet Commonly known as: Coumadin Take 2 tablets (10 mg total) by mouth daily. Or as directed          Outstanding Labs/Studies   None  Duration of Discharge Encounter   Greater than 30 minutes including physician time.  Signed, Theodore Demark, PA-C 05/01/2020, 12:16 PM

## 2020-05-01 NOTE — Plan of Care (Signed)
Problem: Education: °Goal: Knowledge of General Education information will improve °Description: Including pain rating scale, medication(s)/side effects and non-pharmacologic comfort measures °Outcome: Completed/Met °  °Problem: Health Behavior/Discharge Planning: °Goal: Ability to manage health-related needs will improve °Outcome: Completed/Met °  °Problem: Clinical Measurements: °Goal: Ability to maintain clinical measurements within normal limits will improve °Outcome: Completed/Met °Goal: Will remain free from infection °Outcome: Completed/Met °Goal: Diagnostic test results will improve °Outcome: Completed/Met °Goal: Respiratory complications will improve °Outcome: Completed/Met °Goal: Cardiovascular complication will be avoided °Outcome: Completed/Met °  °Problem: Activity: °Goal: Risk for activity intolerance will decrease °Outcome: Completed/Met °  °Problem: Nutrition: °Goal: Adequate nutrition will be maintained °Outcome: Completed/Met °  °Problem: Coping: °Goal: Level of anxiety will decrease °Outcome: Completed/Met °  °Problem: Elimination: °Goal: Will not experience complications related to bowel motility °Outcome: Completed/Met °Goal: Will not experience complications related to urinary retention °Outcome: Completed/Met °  °Problem: Pain Managment: °Goal: General experience of comfort will improve °Outcome: Completed/Met °  °Problem: Safety: °Goal: Ability to remain free from injury will improve °Outcome: Completed/Met °  °Problem: Skin Integrity: °Goal: Risk for impaired skin integrity will decrease °Outcome: Completed/Met °  °Problem: Education: °Goal: Ability to demonstrate management of disease process will improve °Outcome: Completed/Met °Goal: Ability to verbalize understanding of medication therapies will improve °Outcome: Completed/Met °Goal: Individualized Educational Video(s) °Outcome: Completed/Met °  °Problem: Activity: °Goal: Capacity to carry out activities will improve °Outcome:  Completed/Met °  °Problem: Cardiac: °Goal: Ability to achieve and maintain adequate cardiopulmonary perfusion will improve °Outcome: Completed/Met °  °Problem: Activity: °Goal: Ability to tolerate increased activity will improve °Outcome: Completed/Met °  °Problem: Clinical Measurements: °Goal: Ability to maintain a body temperature in the normal range will improve °Outcome: Completed/Met °  °Problem: Respiratory: °Goal: Ability to maintain adequate ventilation will improve °Outcome: Completed/Met °Goal: Ability to maintain a clear airway will improve °Outcome: Completed/Met °  °Problem: Education: °Goal: Knowledge of disease or condition will improve °Outcome: Completed/Met °Goal: Knowledge of secondary prevention will improve °Outcome: Completed/Met °Goal: Knowledge of patient specific risk factors addressed and post discharge goals established will improve °Outcome: Completed/Met °  °Problem: Coping: °Goal: Will verbalize positive feelings about self °Outcome: Completed/Met °Goal: Will identify appropriate support needs °Outcome: Completed/Met °  °Problem: Health Behavior/Discharge Planning: °Goal: Ability to manage health-related needs will improve °Outcome: Completed/Met °  °

## 2020-05-01 NOTE — Progress Notes (Signed)
Patient ID: Joel [name], male   DOB: 04-14-1996, 24 y.o.   MRN: [id number]     Advanced Heart Failure Rounding Note  PCP-Cardiologist: No primary care provider on file.   Subjective:    MRI head showed multifocal bilateral frontoparietal infarcts.   CMRI 1.  Possible left basilar PNA. 2.  Small circumferential pericardial effusion. 3. Severe LV dilation with diffuse hypokinesis, EF 15%. There is a small LV apical thrombus. 4.  Moderate RV dilation with EF 19%. 5. LGE pattern concerning for myocarditis, with diffuse patchy mid-wall LGE.  He is now off milrinone, feels good.  CVP 5 this morning with co-ox 61%.    INR 2.2 on warfarin.   Objective:   Weight Range: 108.9 kg Body mass index is 30.82 kg/m.   Vital Signs:   Temp:  [97.8 F (36.6 C)-98.5 F (36.9 C)] 98.3 F (36.8 C) (10/31 0721) Pulse Rate:  [89-97] 91 (10/31 0721) Resp:  [16-20] 19 (10/31 0721) BP: (110-136)/(69-91) 110/80 (10/31 0721) SpO2:  [97 %-98 %] 98 % (10/31 0721) Weight:  [108.9 kg] 108.9 kg (10/31 0649) Last BM Date: 04/30/20  Weight change: Filed Weights   04/29/20 0455 04/30/20 0500 05/01/20 0649  Weight: 111.4 kg 109.6 kg 108.9 kg    Intake/Output:   Intake/Output Summary (Last 24 hours) at 05/01/2020 0841 Last data filed at 05/01/2020 0700 Gross per 24 hour  Intake 960 ml  Output 1075 ml  Net -115 ml      Physical Exam   CVP 5 General: NAD Neck: No JVD, no thyromegaly or thyroid nodule.  Lungs: Clear to auscultation bilaterally with normal respiratory effort. CV: Nondisplaced PMI.  Heart regular S1/S2, no S3/S4, no murmur.  No peripheral edema.   Abdomen: Soft, nontender, no hepatosplenomegaly, no distention.  Skin: Intact without lesions or rashes.  Neurologic: Alert and oriented x 3.  Psych: Normal affect. Extremities: No clubbing or cyanosis.  HEENT: Normal.     Telemetry   NSR 90s.  Personally reviewed.    Labs    CBC Recent Labs    04/30/20 0457  05/01/20 0136  WBC 8.7 11.5*  HGB 17.3* 18.0*  HCT 53.8* 55.1*  MCV 90.0 89.4  PLT 269 260   Basic Metabolic Panel Recent Labs    84/13/24 0932 04/29/20 0932 04/30/20 0457 05/01/20 0136  NA 134*   < > 136 133*  K 4.7   < > 4.5 4.9  CL 103   < > 105 104  CO2 24   < > 23 17*  GLUCOSE 110*   < > 103* 107*  BUN 15   < > 12 13  CREATININE 1.06   < > 0.98 1.06  CALCIUM 8.6*   < > 8.9 9.3  MG 2.1  --   --   --    < > = values in this interval not displayed.   Liver Function Tests No results for input(s): AST, ALT, ALKPHOS, BILITOT, PROT, ALBUMIN in the last 72 hours. No results for input(s): LIPASE, AMYLASE in the last 72 hours. Cardiac Enzymes No results for input(s): CKTOTAL, CKMB, CKMBINDEX, TROPONINI in the last 72 hours.  BNP: BNP (last 3 results) No results for input(s): BNP in the last 8760 hours.  ProBNP (last 3 results) No results for input(s): PROBNP in the last 8760 hours.   D-Dimer No results for input(s): DDIMER in the last 72 hours. Hemoglobin A1C No results for input(s): HGBA1C in the last 72 hours. Fasting Lipid Panel  No results for input(s): CHOL, HDL, LDLCALC, TRIG, CHOLHDL, LDLDIRECT in the last 72 hours. Thyroid Function Tests No results for input(s): TSH, T4TOTAL, T3FREE, THYROIDAB in the last 72 hours.  Invalid input(s): FREET3  Other results:   Imaging    No results found.   Medications:     Scheduled Medications: . atorvastatin  10 mg Oral Daily  . carvedilol  6.25 mg Oral BID WC  . Chlorhexidine Gluconate Cloth  6 each Topical Daily  . coumadin book   Does not apply Once  . dapagliflozin propanediol  10 mg Oral Daily  . digoxin  0.125 mg Oral Daily  . docusate sodium  100 mg Oral Daily  . furosemide  40 mg Oral Daily  . influenza vac split quadrivalent PF  0.5 mL Intramuscular Tomorrow-1000  . lactobacillus acidophilus  2 tablet Oral TID  . sacubitril-valsartan  1 tablet Oral BID  . sodium chloride flush  10-40 mL  Intracatheter Q12H  . sodium chloride flush  3 mL Intravenous Q12H  . spironolactone  25 mg Oral Daily  . Warfarin - Pharmacist Dosing Inpatient   Does not apply q1600    Infusions: . sodium chloride      PRN Medications: sodium chloride, acetaminophen, guaiFENesin-dextromethorphan, ondansetron (ZOFRAN) IV, sodium chloride flush, sodium chloride flush   Assessment/Plan   1. Acute Biventricular HF: Echo at [facility] with EF < 20%, LV thrombus noted.  He has had dyspnea, orthopnea, and pleuritic chest pain since he had COVID-19 in June. He feels like he never improved.  No family history of cardiomyopathy or premature CAD.  HIV negative. CMRI LVEF 15%, RVEF 19%, LGE pattern concerning for myocarditis. Suspect most likely etiology for cardiomyopathy is viral myocarditis due to COVID-19.  CVP 5 today, he is now off milrinone with co-ox 62%.  BP controlled. - Continue Lasix 40 mg daily.  - Continue digoxin 0.125 mg.  - Continue Entresto 97/103 bid.   - Spironolactone 25 daily. - Continue Farxiga 10 mg daily  - Increase Coreg to 6.25 mg bid.  2. ID: CT chest and CMRI  concerning for multifocal PNA. COVID-19 negative at [place]. ?Sequelae of prior COVID-19 infection with lung scarring or bacterial PNA. Cultures NGTD.  He completed a course of ceftriaxone/doxycycline for CAP.  3. LV thrombus: On warfarin, INR 2.2 today.  4. AKI: Resolved.   5. CVA: Cardioembolic.  Seems to have minimal residual symptoms (tingling left upper arm). Therapeutic INR on warfarin.   6. Suspect OSA: Sleep study as outpatient.  7. Disposition: Home today.  Will need followup in coumadin clinic at [facility] office.  Followup CHF clinic 7-10 days.  Meds for home: Lasix 40 mg daily, digoxin 0.125 daily, Entresto 97/103 bid, warfarin per coumadin clinic, spironolactone 25 mg daily, Farxiga 10 mg daily, Coreg 6.25 mg bid, atorvastatin 10 mg daily.   [name] 05/01/2020 8:41 AM

## 2020-05-01 NOTE — Progress Notes (Signed)
ANTICOAGULATION CONSULT NOTE   Pharmacy Consult for lovenox>heparin and warfarin Indication: LV thrombus  No Known Allergies  Patient Measurements: Height: 6\' 2"  (188 cm) Weight: 108.9 kg (240 lb 1.3 oz) IBW/kg (Calculated) : 82.2 Heparin Dosing Weight: 108.7 kg  Vital Signs: Temp: 97.8 F (36.6 C) (10/31 1147) Temp Source: Oral (10/31 1147) BP: 103/82 (10/31 1147) Pulse Rate: 91 (10/31 0721)  Labs: Recent Labs    04/29/20 0932 04/29/20 0932 04/30/20 0457 05/01/20 0136 05/01/20 0523  HGB 16.9   < > 17.3* 18.0*  --   HCT 51.7  --  53.8* 55.1*  --   PLT 280  --  269 260  --   LABPROT 18.2*  --  25.4*  --  23.8*  INR 1.6*  --  2.4*  --  2.2*  HEPARINUNFRC 0.41  --  0.92*  --   --   CREATININE 1.06  --  0.98 1.06  --    < > = values in this interval not displayed.    Estimated Creatinine Clearance: 143.6 mL/min (by C-G formula based on SCr of 1.06 mg/dL).   Medical History: Past Medical History:  Diagnosis Date  . Anxiety   . Cerebral embolism with cerebral infarction 04/28/2020  . Chronic systolic CHF 04/24/2020   Echo at [facility] 04/2020: EF < 20, mild to mod MR, LV thrombus  . Depression   . Dilated cardiomyopathy (HCC)   . History of COVID-19   . Hypertension   . LV (left ventricular) mural thrombus     Medications:  Scheduled:  . atorvastatin  10 mg Oral Daily  . carvedilol  6.25 mg Oral BID WC  . Chlorhexidine Gluconate Cloth  6 each Topical Daily  . coumadin book   Does not apply Once  . dapagliflozin propanediol  10 mg Oral Daily  . digoxin  0.125 mg Oral Daily  . docusate sodium  100 mg Oral Daily  . furosemide  40 mg Oral Daily  . lactobacillus acidophilus  2 tablet Oral TID  . sacubitril-valsartan  1 tablet Oral BID  . sodium chloride flush  10-40 mL Intracatheter Q12H  . sodium chloride flush  3 mL Intravenous Q12H  . spironolactone  25 mg Oral Daily  . Warfarin - Pharmacist Dosing Inpatient   Does not apply q1600     Assessment: 24 yof transferred from [facility] with SOB and hemoptysis for 1 month. ECHO found LVEF<20% and LV thrombus.   Code stroke 10/26 and again 10/27 am. MRI showed multifocal b/l frontoparietal infarcts. After discussion with neurology and cardiology felt it was safer to change back to heparin for reversal purposes.   Heparin drip stopped 10/30  with INR >2 at goal. INR 2.2 at goal today No bleeding issues noted. CBC stable. Plan to DC home   Goal of Therapy:  INR goal 2-3 Heparin level 0.3-0.7 units/ml Monitor platelets by anticoagulation protocol: Yes   Plan:  Warfarin 10mg  daily Patient has INR visit scheduled outpt 11/3   Pharm.D. CPP, BCPS Clinical Pharmacist [phone] 05/01/2020 12:33 PM   [facility] pharmacy phone numbers are listed on [email or web address]

## 2020-05-02 ENCOUNTER — Telehealth (HOSPITAL_COMMUNITY): Payer: Self-pay | Admitting: Licensed Clinical Social Worker

## 2020-05-02 ENCOUNTER — Telehealth (HOSPITAL_COMMUNITY): Payer: Self-pay | Admitting: Cardiology

## 2020-05-02 NOTE — Telephone Encounter (Signed)
Completed [employer] and AZ&Me applications sent in for [employer] assistance- fax confirmation received- awaiting determination  Burna Sis, LCSW Clinical Social Worker Advanced Heart Failure Clinic Desk#: [phone] Cell#: [phone]

## 2020-05-04 ENCOUNTER — Other Ambulatory Visit: Payer: Self-pay

## 2020-05-04 ENCOUNTER — Telehealth (HOSPITAL_COMMUNITY): Payer: Self-pay | Admitting: Pharmacy Technician

## 2020-05-04 ENCOUNTER — Ambulatory Visit (INDEPENDENT_AMBULATORY_CARE_PROVIDER_SITE_OTHER): Payer: Self-pay | Admitting: *Deleted

## 2020-05-04 DIAGNOSIS — I240 Acute coronary thrombosis not resulting in myocardial infarction: Secondary | ICD-10-CM

## 2020-05-04 DIAGNOSIS — Z5181 Encounter for therapeutic drug level monitoring: Secondary | ICD-10-CM | POA: Insufficient documentation

## 2020-05-04 LAB — POCT INR: INR: 4.1 — AB (ref 2.0–3.0)

## 2020-05-04 NOTE — Patient Instructions (Addendum)
Description   Hold warfarin today and then start taking warfarin 1 tablet daily. Be consistent with your 3 servings of greens per week. Recheck INR in 1 week. Coumadin Clinic: [phone]  A full discussion of the nature of anticoagulants has been carried out.  A benefit risk analysis has been presented to the patient, so that they understand the justification for choosing anticoagulation at this time. The need for frequent and regular monitoring, precise dosage adjustment and compliance is stressed.  Side effects of potential bleeding are discussed.  The patient should avoid any OTC items containing aspirin or ibuprofen, and should avoid great swings in general diet.  Avoid alcohol consumption.  Call if any signs of abnormal bleeding.

## 2020-05-04 NOTE — Telephone Encounter (Signed)
Advanced Heart Failure Patient Advocate Encounter   Patient was approved to receive Entresto from [employer]  Patient ID: [id number] Effective dates: 05/04/20 through 05/03/21

## 2020-05-05 NOTE — Telephone Encounter (Signed)
Called and spoke with the patient regarding approval.  Will check on the status of the AZ&Me Marcelline Deist) application.

## 2020-05-09 NOTE — Telephone Encounter (Signed)
error 

## 2020-05-10 ENCOUNTER — Ambulatory Visit (INDEPENDENT_AMBULATORY_CARE_PROVIDER_SITE_OTHER): Payer: Self-pay

## 2020-05-10 ENCOUNTER — Other Ambulatory Visit: Payer: Self-pay

## 2020-05-10 DIAGNOSIS — I240 Acute coronary thrombosis not resulting in myocardial infarction: Secondary | ICD-10-CM

## 2020-05-10 DIAGNOSIS — Z5181 Encounter for therapeutic drug level monitoring: Secondary | ICD-10-CM

## 2020-05-10 LAB — POCT INR: INR: 2.1 (ref 2.0–3.0)

## 2020-05-10 NOTE — Patient Instructions (Signed)
Continue taking warfarin 1 tablet daily. Be consistent with your 3 servings of greens per week. Recheck INR in 1 week. Coumadin Clinic: [phone]

## 2020-05-11 ENCOUNTER — Encounter (HOSPITAL_COMMUNITY): Payer: Self-pay | Admitting: Cardiology

## 2020-05-11 ENCOUNTER — Ambulatory Visit (HOSPITAL_COMMUNITY)
Admission: RE | Admit: 2020-05-11 | Discharge: 2020-05-11 | Disposition: A | Discharge status: Home / Self Care | Payer: Self-pay | Source: Ambulatory Visit | Attending: Cardiology | Admitting: Cardiology

## 2020-05-11 ENCOUNTER — Ambulatory Visit: Payer: Self-pay | Attending: Physician Assistant | Admitting: Physician Assistant

## 2020-05-11 VITALS — BP 100/84 | HR 91 | Wt 254.0 lb

## 2020-05-11 DIAGNOSIS — Z7901 Long term (current) use of anticoagulants: Secondary | ICD-10-CM | POA: Insufficient documentation

## 2020-05-11 DIAGNOSIS — I5022 Chronic systolic (congestive) heart failure: Secondary | ICD-10-CM | POA: Insufficient documentation

## 2020-05-11 DIAGNOSIS — I240 Acute coronary thrombosis not resulting in myocardial infarction: Secondary | ICD-10-CM

## 2020-05-11 DIAGNOSIS — Z8673 Personal history of transient ischemic attack (TIA), and cerebral infarction without residual deficits: Secondary | ICD-10-CM | POA: Insufficient documentation

## 2020-05-11 DIAGNOSIS — Z8616 Personal history of COVID-19: Secondary | ICD-10-CM | POA: Insufficient documentation

## 2020-05-11 DIAGNOSIS — Z87891 Personal history of nicotine dependence: Secondary | ICD-10-CM | POA: Insufficient documentation

## 2020-05-11 DIAGNOSIS — I110 Hypertensive heart disease with heart failure: Secondary | ICD-10-CM | POA: Insufficient documentation

## 2020-05-11 DIAGNOSIS — Z79899 Other long term (current) drug therapy: Secondary | ICD-10-CM | POA: Insufficient documentation

## 2020-05-11 LAB — COMPREHENSIVE METABOLIC PANEL
ALT: 83 U/L — ABNORMAL HIGH (ref 0–44)
AST: 39 U/L (ref 15–41)
Albumin: 3.8 g/dL (ref 3.5–5.0)
Alkaline Phosphatase: 71 U/L (ref 38–126)
Anion gap: 10 (ref 5–15)
BUN: 26 mg/dL — ABNORMAL HIGH (ref 6–20)
CO2: 24 mmol/L (ref 22–32)
Calcium: 9.8 mg/dL (ref 8.9–10.3)
Chloride: 103 mmol/L (ref 98–111)
Creatinine, Ser: 0.96 mg/dL (ref 0.61–1.24)
GFR, Estimated: >60 mL/min (ref >60)
Glucose, Bld: 89 mg/dL (ref 70–99)
Potassium: 5 mmol/L (ref 3.5–5.1)
Sodium: 137 mmol/L (ref 135–145)
Total Bilirubin: 0.7 mg/dL (ref 0.3–1.2)
Total Protein: 7.7 g/dL (ref 6.5–8.1)

## 2020-05-11 LAB — CBC
HCT: 53.3 % — ABNORMAL HIGH (ref 39.0–52.0)
Hemoglobin: 17.2 g/dL — ABNORMAL HIGH (ref 13.0–17.0)
MCH: 28.9 pg (ref 26.0–34.0)
MCHC: 32.3 g/dL (ref 30.0–36.0)
MCV: 89.4 fL (ref 80.0–100.0)
Platelets: 378 10*3/uL (ref 150–400)
RBC: 5.96 MIL/uL — ABNORMAL HIGH (ref 4.22–5.81)
RDW: 14.2 % (ref 11.5–15.5)
WBC: 7.5 10*3/uL (ref 4.0–10.5)
nRBC: 0 % (ref 0.0–0.2)

## 2020-05-11 MED ORDER — FUROSEMIDE 20 MG PO TABS
20.0000 mg | ORAL_TABLET | Freq: Every day | ORAL | 3 refills | Status: DC
Start: 2020-05-11 — End: 2020-08-30

## 2020-05-11 MED ORDER — CARVEDILOL 6.25 MG PO TABS
9.3750 mg | ORAL_TABLET | Freq: Two times a day (BID) | ORAL | 3 refills | Status: DC
Start: 2020-05-11 — End: 2020-06-02

## 2020-05-11 NOTE — Patient Instructions (Addendum)
Labs done today. We will contact you only if your labs are abnormal.  INCREASE Carvedilol 9.375mg (1 & 1/2 tablet) by mouth 2 times daily.  DECREASE Lasix 20mg (1 tablet) by mouth daily.  No other medication changes were made please continue all other current medications as prescribed.  Your physician recommends that you schedule a follow-up appointment in: 3 weeks for an appointment with APP clinic and 6 weeks with Dr.   If you have any questions or concerns before your next appointment please send [name] a message through [facility] or call our office at [phone].    TO LEAVE A MESSAGE FOR THE NURSE SELECT OPTION 2, PLEASE LEAVE A MESSAGE INCLUDING: . YOUR NAME . DATE OF BIRTH . CALL BACK NUMBER . REASON FOR CALL**this is important as we prioritize the call backs  YOU WILL RECEIVE A CALL BACK THE SAME DAY AS LONG AS YOU CALL BEFORE 4:00 PM   At the Advanced Heart Failure Clinic, you and your health needs are our priority. As part of our continuing mission to provide you with exceptional heart care, we have created designated Provider Care Teams. These Care Teams include your primary Cardiologist (physician) and Advanced Practice Providers (APPs- Physician Assistants and Nurse Practitioners) who all work together to provide you with the care you need, when you need it.   You may see any of the following providers on your designated Care Team at your next follow up: [phone] Dr Marland Kitchen . Dr Arvilla Meres . [name], NP . Tonye Becket, PA . Robbie Lis, PharmD   Please be sure to bring in all your medications bottles to every appointment.

## 2020-05-11 NOTE — Progress Notes (Signed)
PCP: Patient, No Pcp Per  Cardiology: Dr. Leotis Pain Joel [name] is 24 y.o. male with a hx of hypertension, anxiety and prior COVID-19 infection in June in 2021 who presented to [facility] in 10/21 with shortness of breath and hemoptysis x 1 month.  An echocardiogram demonstrated EF < 20% and an LV thrombus.  A CT scan demonstrated multifocal pneumonia, no PE.  He was started on furosemide and Lovenox and transferred to [facility] for further evaluation and management.  He was started on milrinone with low output by co-ox and diuresed. Cardiac MRI showed severe LV dilation with diffuse hypokinesis, EF 15%, small LV apical thrombus, moderate RV dilation with EF 19%, LGE pattern concerning for myocarditis with diffuse patchy mid-wall LGE.  He was treated with ceftriaxone and doxycycline for PNA. He had a CVA with left-sided weakness and left facial numbness.  MRI showed multifocal bilateral infarcts consistent with cardio-embolism.  He was transitioned from Lovenox/warfarin to IV heparin gtt/warfarin.  Left-sided weakness resolved. He was titrated onto cardiac meds and eventually discharged home.    He returns today for followup of CHF.  He has felt great since discharge, feels like he is back to normal.  No significant exertional dyspnea.  No orthopnea/PND.  No chest pain.  He stays active.  No lightheadedness. He is taking all meds as ordered.  BP has been stable.  He is living with his grandparents in [place].  No smoking or drinking, no drugs. 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    ECG (personally reviewed): NSR, nonspecific T wave flattening  Labs (10/21): K 4.9, creatinine 1.06  PMH: 1. H/o COVID-19 PNA 6/21.  2. HTN 3. Anxiety 4. Chronic systolic CHF: Nonischemic cardiomyopathy.   - Echo (10/21): EF < 20%, LV thrombus.  - Cardiac MRI (10/21): Severe LV dilation with diffuse hypokinesis, EF 15%, small LV apical thrombus, moderate RV dilation with EF  19%, LGE pattern concerning for myocarditis, with diffuse patchy mid-wall LGE. 5. CVA: 10/21, suspect cardio-embolic.  6. LV apical thrombus 7. Sleep study negative.   Social History   Socioeconomic History  . Marital status: Single    Spouse name: Not on file  . Number of children: Not on file  . Years of education: Not on file  . Highest education level: Not on file  Occupational History  . Not on file  Tobacco Use  . Smoking status: Former Smoker    Types: E-cigarettes  . Smokeless tobacco: Current User    Types: Chew  . Tobacco comment: quit around June 2021  Vaping Use  . Vaping Use: Former  . Substances: Nicotine  Substance and Sexual Activity  . Alcohol use: Not Currently    Comment: Patient used to drink occasionally a beer, but stopped when started lexapro  . Drug use: Never  . Sexual activity: Yes    Birth control/protection: Condom  Other Topics Concern  . Not on file  Social History Narrative  . Not on file   Social Determinants of Health   Financial Resource Strain:   . Difficulty of Paying Living Expenses: Not on file  Food Insecurity:   . Worried About Programme researcher, broadcasting/film/video in the Last Year: Not on file  . Ran Out of Food in the Last Year: Not on file  Transportation Needs:   . Lack of Transportation (Medical): Not on file  . Lack of Transportation (Non-Medical): Not on file  Physical Activity:   .  Days of Exercise per Week: Not on file  . Minutes of Exercise per Session: Not on file  Stress:   . Feeling of Stress : Not on file  Social Connections:   . Frequency of Communication with Friends and Family: Not on file  . Frequency of Social Gatherings with Friends and Family: Not on file  . Attends Religious Services: Not on file  . Active Member of Clubs or Organizations: Not on file  . Attends Banker Meetings: Not on file  . Marital Status: Not on file  Intimate Partner Violence:   . Fear of Current or Ex-Partner: Not on file  .  Emotionally Abused: Not on file  . Physically Abused: Not on file  . Sexually Abused: Not on file   Family History  Problem Relation Age of Onset  . Heart failure Neg Hx    ROS: All systems reviewed and negative except as per HPI.   Current Outpatient Medications  Medication Sig Dispense Refill  . atorvastatin (LIPITOR) 10 MG tablet Take 1 tablet (10 mg total) by mouth daily. 30 tablet 6  . carvedilol (COREG) 6.25 MG tablet Take 1.5 tablets (9.375 mg total) by mouth 2 (two) times daily with a meal. 270 tablet 3  . dapagliflozin propanediol (FARXIGA) 10 MG TABS tablet Take 1 tablet (10 mg total) by mouth daily. 30 tablet 6  . digoxin (LANOXIN) 0.125 MG tablet Take 1 tablet (0.125 mg total) by mouth daily. 30 tablet 6  . fexofenadine (ALLEGRA) 60 MG tablet Take 60 mg by mouth daily as needed for allergies or rhinitis.    Marland Kitchen FLOVENT HFA 110 MCG/ACT inhaler Inhale 2 puffs into the lungs 2 (two) times daily.    . furosemide (LASIX) 20 MG tablet Take 1 tablet (20 mg total) by mouth daily. 90 tablet 3  . montelukast (SINGULAIR) 10 MG tablet Take 10 mg by mouth at bedtime.    Marland Kitchen PROAIR HFA 108 (90 Base) MCG/ACT inhaler Inhale 2 puffs into the lungs every 6 (six) hours as needed for shortness of breath.    . sacubitril-valsartan (ENTRESTO) 97-103 MG Take 1 tablet by mouth 2 (two) times daily. 60 tablet 6  . spironolactone (ALDACTONE) 25 MG tablet Take 1 tablet (25 mg total) by mouth daily. 30 tablet 6  . warfarin (COUMADIN) 5 MG tablet Take 2 tablets (10 mg total) by mouth daily. Or as directed 60 tablet 6   No current facility-administered medications for this encounter.   BP 100/84   Pulse 91   Wt 115.2 kg   SpO2 98%   BMI 32.61 kg/m  General: NAD Neck: No JVD, no thyromegaly or thyroid nodule.  Lungs: Clear to auscultation bilaterally with normal respiratory effort. CV: Nondisplaced PMI.  Heart regular S1/S2, no S3/S4, no murmur.  No peripheral edema.  No carotid bruit.  Normal pedal  pulses.  Abdomen: Soft, nontender, no hepatosplenomegaly, no distention.  Skin: Intact without lesions or rashes.  Neurologic: Alert and oriented x 3.  Psych: Normal affect. Extremities: No clubbing or cyanosis.  HEENT: Normal.   Assessment/Plan: 1. Chronic systolic CHF: Appears to be nonischemic cardiomyopathy.  Echo in 10/21 at [facility] with EF <20%, LV thrombus noted. When he presented in 10/21, he had had dyspnea, orthopnea, and pleuritic chest pain since he had COVID-19 in 6/21. He feels like he never improved. No family history of cardiomyopathy or premature CAD.  HIV negative. CMRI in 10/21 with LVEF 15%, RVEF 19%, LGE pattern concerning for myocarditis.  Suspect most likely etiology for cardiomyopathy is viral myocarditis due to COVID-19. He is now NYHA class I-II, taking medications as ordered and not volume overloaded.  - Think we can drop Lasix to 20 mg daily.  BMET today.   - Continue digoxin 0.125 mg. Needs digoxin level.  - Continue Entresto 97/103 bid.   - Continue spironolactone 25 daily. - Continue Farxiga 10 mg daily  - Increase Coreg to 9.375 mg bid.  - He will need echo in 3-4 months to reassess LV EF with medical management.  2. LV thrombus: On warfarin, INR followed at [facility] in [place].  3. CVA: Cardioembolic from LV thrombus.  Seems to have minimal residual symptoms (mild tingling left face). - Continue warfarin.  - Followup with neurology.   Followup in 3 wks with APP, 6 wks with me.   [name] 05/11/2020 12:55 PM

## 2020-05-11 NOTE — Telephone Encounter (Signed)
Advanced Heart Failure Patient Advocate Encounter   Patient was approved to receive Farxiga from AZ&Me  Effective dates: 05/05/20 through 05/05/21  The prescription was sent on 11/08 and the patient should receive it in 7-10 business days.   Spoke with the patient regarding approval.  Archer Asa, CPhT

## 2020-05-17 ENCOUNTER — Other Ambulatory Visit: Payer: Self-pay

## 2020-05-17 ENCOUNTER — Ambulatory Visit (INDEPENDENT_AMBULATORY_CARE_PROVIDER_SITE_OTHER): Payer: Self-pay

## 2020-05-17 DIAGNOSIS — Z5181 Encounter for therapeutic drug level monitoring: Secondary | ICD-10-CM

## 2020-05-17 DIAGNOSIS — I240 Acute coronary thrombosis not resulting in myocardial infarction: Secondary | ICD-10-CM

## 2020-05-17 LAB — POCT INR: INR: 1.8 — AB (ref 2.0–3.0)

## 2020-05-17 NOTE — Patient Instructions (Signed)
Take 2 tablets today only and then Continue taking warfarin 1 tablet daily. Be consistent with your 3 servings of greens per week. Recheck INR in 1 week. Coumadin Clinic: [phone]

## 2020-05-24 ENCOUNTER — Ambulatory Visit (INDEPENDENT_AMBULATORY_CARE_PROVIDER_SITE_OTHER): Payer: Self-pay

## 2020-05-24 ENCOUNTER — Other Ambulatory Visit: Payer: Self-pay

## 2020-05-24 DIAGNOSIS — Z5181 Encounter for therapeutic drug level monitoring: Secondary | ICD-10-CM

## 2020-05-24 DIAGNOSIS — I240 Acute coronary thrombosis not resulting in myocardial infarction: Secondary | ICD-10-CM

## 2020-05-24 LAB — POCT INR: INR: 2.4 (ref 2.0–3.0)

## 2020-05-24 NOTE — Patient Instructions (Signed)
Continue taking warfarin 1 tablet daily. Be consistent with your 3 servings of greens per week. Recheck INR in 3 weeks. Coumadin Clinic: [phone]

## 2020-05-31 ENCOUNTER — Encounter: Payer: Self-pay | Admitting: Neurology

## 2020-05-31 ENCOUNTER — Other Ambulatory Visit: Payer: Self-pay

## 2020-05-31 ENCOUNTER — Ambulatory Visit: Payer: Self-pay | Admitting: Neurology

## 2020-05-31 VITALS — BP 169/80 | HR 83 | Ht 72.0 in | Wt 269.8 lb

## 2020-05-31 DIAGNOSIS — I63139 Cerebral infarction due to embolism of unspecified carotid artery: Secondary | ICD-10-CM

## 2020-05-31 DIAGNOSIS — R200 Anesthesia of skin: Secondary | ICD-10-CM

## 2020-05-31 NOTE — Patient Instructions (Signed)
I had a long d/w patient and his grandmother about his recent embolic strokes, risk for recurrent stroke/TIAs, personally independently reviewed imaging studies and stroke evaluation results and answered questions.Continue warfarin daily  for secondary stroke prevention and maintain strict control of hypertension with blood pressure goal below 130/90, diabetes with hemoglobin A1c goal below 6.5% and lipids with LDL cholesterol goal below 70 mg/dL. I also advised the patient to eat a healthy diet with plenty of whole grains, cereals, fruits and vegetables, exercise regularly and maintain ideal body weight.  I recommend follow-up with his cardiologist with repeat echocardiogram in a few months and if ejection fraction is improved and LV clot has resolved may consider switching from warfarin to aspirin.  Patient plans to return to work next week I advised him to increase his exertion as tolerated slowly.  Followup in the future with my nurse practitioner Shanda Bumps in 3 months or call earlier if necessary.  Stroke Prevention Some medical conditions and behaviors are associated with a higher chance of having a stroke. You can help prevent a stroke by making nutrition, lifestyle, and other changes, including managing any medical conditions you may have. What nutrition changes can be made?   Eat healthy foods. You can do this by: ? Choosing foods high in fiber, such as fresh fruits and vegetables and whole grains. ? Eating at least 5 or more servings of fruits and vegetables a day. Try to fill half of your plate at each meal with fruits and vegetables. ? Choosing lean protein foods, such as lean cuts of meat, poultry without skin, fish, tofu, beans, and nuts. ? Eating low-fat dairy products. ? Avoiding foods that are high in salt (sodium). This can help lower blood pressure. ? Avoiding foods that have saturated fat, trans fat, and cholesterol. This can help prevent high cholesterol. ? Avoiding processed and  premade foods.  Follow your health care provider's specific guidelines for losing weight, controlling high blood pressure (hypertension), lowering high cholesterol, and managing diabetes. These may include: ? Reducing your daily calorie intake. ? Limiting your daily sodium intake to 1,500 milligrams (mg). ? Using only healthy fats for cooking, such as olive oil, canola oil, or sunflower oil. ? Counting your daily carbohydrate intake. What lifestyle changes can be made?  Maintain a healthy weight. Talk to your health care provider about your ideal weight.  Get at least 30 minutes of moderate physical activity at least 5 days a week. Moderate activity includes brisk walking, biking, and swimming.  Do not use any products that contain nicotine or tobacco, such as cigarettes and e-cigarettes. If you need help quitting, ask your health care provider. It may also be helpful to avoid exposure to secondhand smoke.  Limit alcohol intake to no more than 1 drink a day for nonpregnant women and 2 drinks a day for men. One drink equals 12 oz of beer, 5 oz of wine, or 1 oz of hard liquor.  Stop any illegal drug use.  Avoid taking birth control pills. Talk to your health care provider about the risks of taking birth control pills if: ? You are over 59 years old. ? You smoke. ? You get migraines. ? You have ever had a blood clot. What other changes can be made?  Manage your cholesterol levels. ? Eating a healthy diet is important for preventing high cholesterol. If cholesterol cannot be managed through diet alone, you may also need to take medicines. ? Take any prescribed medicines to control your cholesterol  as told by your health care provider.  Manage your diabetes. ? Eating a healthy diet and exercising regularly are important parts of managing your blood sugar. If your blood sugar cannot be managed through diet and exercise, you may need to take medicines. ? Take any prescribed medicines to  control your diabetes as told by your health care provider.  Control your hypertension. ? To reduce your risk of stroke, try to keep your blood pressure below 130/80. ? Eating a healthy diet and exercising regularly are an important part of controlling your blood pressure. If your blood pressure cannot be managed through diet and exercise, you may need to take medicines. ? Take any prescribed medicines to control hypertension as told by your health care provider. ? Ask your health care provider if you should monitor your blood pressure at home. ? Have your blood pressure checked every year, even if your blood pressure is normal. Blood pressure increases with age and some medical conditions.  Get evaluated for sleep disorders (sleep apnea). Talk to your health care provider about getting a sleep evaluation if you snore a lot or have excessive sleepiness.  Take over-the-counter and prescription medicines only as told by your health care provider. Aspirin or blood thinners (antiplatelets or anticoagulants) may be recommended to reduce your risk of forming blood clots that can lead to stroke.  Make sure that any other medical conditions you have, such as atrial fibrillation or atherosclerosis, are managed. What are the warning signs of a stroke? The warning signs of a stroke can be easily remembered as BEFAST.  B is for balance. Signs include: ? Dizziness. ? Loss of balance or coordination. ? Sudden trouble walking.  E is for eyes. Signs include: ? A sudden change in vision. ? Trouble seeing.  F is for face. Signs include: ? Sudden weakness or numbness of the face. ? The face or eyelid drooping to one side.  A is for arms. Signs include: ? Sudden weakness or numbness of the arm, usually on one side of the body.  S is for speech. Signs include: ? Trouble speaking (aphasia). ? Trouble understanding.  T is for time. ? These symptoms may represent a serious problem that is an emergency.  Do not wait to see if the symptoms will go away. Get medical help right away. Call your local emergency services (911 in the [place] Do not drive yourself to the hospital.  Other signs of stroke may include: ? A sudden, severe headache with no known cause. ? Nausea or vomiting. ? Seizure. Where to find more information For more information, visit:  American Stroke Association: www.strokeassociation.org  National Stroke Association: www.stroke.org Summary  You can prevent a stroke by eating healthy, exercising, not smoking, limiting alcohol intake, and managing any medical conditions you may have.  Do not use any products that contain nicotine or tobacco, such as cigarettes and e-cigarettes. If you need help quitting, ask your health care provider. It may also be helpful to avoid exposure to secondhand smoke.  Remember BEFAST for warning signs of stroke. Get help right away if you or a loved one has any of these signs. This information is not intended to replace advice given to you by your health care provider. Make sure you discuss any questions you have with your health care provider. Document Revised: 05/31/2017 Document Reviewed: 07/24/2016 Elsevier Patient Education  2020 [employer].

## 2020-05-31 NOTE — Progress Notes (Signed)
[facility] [address] [place]. [place] [address] [phone]       OFFICE FOLLOW-UP NOTE  [name] Date of Birth:  1996-05-20 Medical Record Number:  [id number]   HPI: Joel [name] is a 24 year old male seen today for initial office follow-up visit following hospital consultation for stroke in October 2021.  Patient has past medical history of hypertension, anxiety, COVID-19 infection in June 2021 followed by myocarditis with left ventricular apical thrombus.  He presented to [facility] with shortness of breath and hemoptysis for a month and echocardiogram there showed ejection fraction less than 20% with left ventricular apical thrombus and CT scan showing multifocal pneumonia.  He was started on IV heparin and Lasix and transferred to [facility] on 04/24/2020.  He developed sudden onset of right flaccid hemiplegia on 04/25/2020 with NIH stroke scale of 7.  He was not a candidate for TPA as he had received low molecular weight heparin within 24 hours.  CT angiogram of the head and neck both showed no significant large vessel stenosis or occlusion.  MRI scan shows multiple bifrontal parietal left MCA and bilateral ACA territory embolic infarcts.  LDL cholesterol is 85 mg percent.  Hemoglobin A1c was 5.8.  Cardiac MRI was obtained which showed ejection fraction less than 15% with apical clot.  He appears to be at risk for obstructive sleep apnea and was screened for the sleep smart stroke prevention study but did not meet criteria.  Patient was initially started on IV heparin and bridged to warfarin.  He states he has done well since discharge.  His right-sided weakness has improved.  He is living at home with his grandmother.  Tolerating warfarin well without bruising or bleeding and last INR on 05/24/2020 was optimal at 2.4.  His blood pressure is usually well controlled today it is elevated at 169/18 office.  He has followed up in the cardiology  clinic.  He states he occasionally gets intermittent paresthesias on his face but his strength overall has improved.  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  He has no new complaints today.  ROS:   14 system review of systems is positive for numbness, tingling, weakness, shortness of breath, poor stamina and all other systems negative  PMH:  Past Medical History:  Diagnosis Date  . Anxiety   . Cerebral embolism with cerebral infarction 04/28/2020  . Chronic systolic CHF 04/24/2020   Echo at [facility] 04/2020: EF < 20, mild to mod MR, LV thrombus  . Depression   . Dilated cardiomyopathy (HCC)   . History of COVID-19   . Hypertension   . LV (left ventricular) mural thrombus     Social History:  Social History   Socioeconomic History  . Marital status: Single    Spouse name: Not on file  . Number of children: Not on file  . Years of education: Not on file  . Highest education level: Not on file  Occupational History  . Occupation: Full time/Plumbing  Tobacco Use  . Smoking status: Former Smoker    Types: E-cigarettes  . Smokeless tobacco: Current User    Types: Chew  . Tobacco comment: quit around June 2021  Vaping Use  . Vaping Use: Former  . Substances: Nicotine  Substance and Sexual Activity  . Alcohol use: Not Currently    Comment: Patient used to drink occasionally a beer, but stopped when started lexapro  . Drug use:  Never  . Sexual activity: Yes    Birth control/protection: Condom  Other Topics Concern  . Not on file  Social History Narrative   Lives with grandmother, grandfather and sister   Right Handed   Drinks rarely caffeine   Social Determinants of Health   Financial Resource Strain:   . Difficulty of Paying Living Expenses: Not on file  Food Insecurity:   . Worried About Programme researcher, broadcasting/film/video in the Last Year: Not on file  . Ran Out of Food in the Last Year: Not on file  Transportation Needs:   . Lack of Transportation  (Medical): Not on file  . Lack of Transportation (Non-Medical): Not on file  Physical Activity:   . Days of Exercise per Week: Not on file  . Minutes of Exercise per Session: Not on file  Stress:   . Feeling of Stress : Not on file  Social Connections:   . Frequency of Communication with Friends and Family: Not on file  . Frequency of Social Gatherings with Friends and Family: Not on file  . Attends Religious Services: Not on file  . Active Member of Clubs or Organizations: Not on file  . Attends Banker Meetings: Not on file  . Marital Status: Not on file  Intimate Partner Violence:   . Fear of Current or Ex-Partner: Not on file  . Emotionally Abused: Not on file  . Physically Abused: Not on file  . Sexually Abused: Not on file    Medications:   Current Outpatient Medications on File Prior to Visit  Medication Sig Dispense Refill  . atorvastatin (LIPITOR) 10 MG tablet Take 1 tablet (10 mg total) by mouth daily. 30 tablet 6  . carvedilol (COREG) 6.25 MG tablet Take 1.5 tablets (9.375 mg total) by mouth 2 (two) times daily with a meal. 270 tablet 3  . dapagliflozin propanediol (FARXIGA) 10 MG TABS tablet Take 1 tablet (10 mg total) by mouth daily. 30 tablet 6  . digoxin (LANOXIN) 0.125 MG tablet Take 1 tablet (0.125 mg total) by mouth daily. 30 tablet 6  . fexofenadine (ALLEGRA) 60 MG tablet Take 60 mg by mouth daily as needed for allergies or rhinitis.    Marland Kitchen FLOVENT HFA 110 MCG/ACT inhaler Inhale 2 puffs into the lungs 2 (two) times daily.    . furosemide (LASIX) 20 MG tablet Take 1 tablet (20 mg total) by mouth daily. 90 tablet 3  . montelukast (SINGULAIR) 10 MG tablet Take 10 mg by mouth at bedtime.    Marland Kitchen PROAIR HFA 108 (90 Base) MCG/ACT inhaler Inhale 2 puffs into the lungs every 6 (six) hours as needed for shortness of breath.    . sacubitril-valsartan (ENTRESTO) 97-103 MG Take 1 tablet by mouth 2 (two) times daily. 60 tablet 6  . spironolactone (ALDACTONE) 25 MG  tablet Take 1 tablet (25 mg total) by mouth daily. 30 tablet 6  . warfarin (COUMADIN) 5 MG tablet Take 2 tablets (10 mg total) by mouth daily. Or as directed 60 tablet 6   No current facility-administered medications on file prior to visit.    Allergies:  No Known Allergies  Physical Exam General: Obese young male, seated, in no evident distress Head: head normocephalic and atraumatic.  Neck: supple with no carotid or supraclavicular bruits Cardiovascular: regular rate and rhythm, no murmurs Musculoskeletal: no deformity Skin:  no rash/petichiae Vascular:  Normal pulses all extremities Vitals:   05/31/20 1101  BP: (!) 169/80  Pulse: 83  Neurologic Exam Mental Status: Awake and fully alert. Oriented to place and time. Recent and remote memory intact. Attention span, concentration and fund of knowledge appropriate. Mood and affect appropriate.  Cranial Nerves: Fundoscopic exam reveals sharp disc margins. Pupils equal, briskly reactive to light. Extraocular movements full without nystagmus. Visual fields full to confrontation. Hearing intact. Facial sensation intact. Face, tongue, palate moves normally and symmetrically.  Motor: Normal bulk and tone. Normal strength in all tested extremity muscles. Sensory.: intact to touch ,pinprick .position and vibratory sensation.  Coordination: Rapid alternating movements normal in all extremities. Finger-to-nose and heel-to-shin performed accurately bilaterally. Gait and Station: Arises from chair without difficulty. Stance is normal. Gait demonstrates normal stride length and balance . Able to heel, toe and tandem walk with moderatet difficulty.  Reflexes: 1+ and symmetric. Toes downgoing.   NIHSS  0 Modified Rankin  1   ASSESSMENT: 24 year old Caucasian male with past cerebral embolic infarcts in October 2021 secondary to cardiogenic embolism from left ventricular apical clot from Covid related myocarditis and cardiomyopathy.  Doing remarkably  well with practically no residual deficits     PLAN: I had a long d/w patient and his grandmother about his recent embolic strokes, risk for recurrent stroke/TIAs, personally independently reviewed imaging studies and stroke evaluation results and answered questions.Continue warfarin daily  for secondary stroke prevention and maintain strict control of hypertension with blood pressure goal below 130/90, diabetes with hemoglobin A1c goal below 6.5% and lipids with LDL cholesterol goal below 70 mg/dL. I also advised the patient to eat a healthy diet with plenty of whole grains, cereals, fruits and vegetables, exercise regularly and maintain ideal body weight.  I recommend follow-up with his cardiologist with repeat echocardiogram in a few months and if ejection fraction is improved and LV clot has resolved may consider switching from warfarin to aspirin.  Patient plans to return to work next week I advised him to increase his exertion as tolerated slowly.  Followup in the future with my nurse practitioner Shanda Bumps in 3 months or call earlier if necessary. Greater than 50% of time during this 30 minute visit was spent on counseling,explanation of diagnosis, planning of further management, discussion with patient and family and coordination of care Delia Heady, MD Note: This document was prepared with digital dictation and possible smart phrase technology. Any transcriptional errors that result from this process are unintentional

## 2020-06-02 ENCOUNTER — Ambulatory Visit (HOSPITAL_COMMUNITY)
Admission: RE | Admit: 2020-06-02 | Discharge: 2020-06-02 | Disposition: A | Discharge status: Home / Self Care | Payer: Self-pay | Source: Ambulatory Visit | Attending: Adult Health | Admitting: Adult Health

## 2020-06-02 ENCOUNTER — Other Ambulatory Visit: Payer: Self-pay

## 2020-06-02 ENCOUNTER — Encounter (HOSPITAL_COMMUNITY): Payer: Self-pay

## 2020-06-02 VITALS — BP 122/90 | HR 89 | Wt 271.0 lb

## 2020-06-02 DIAGNOSIS — Z87891 Personal history of nicotine dependence: Secondary | ICD-10-CM | POA: Insufficient documentation

## 2020-06-02 DIAGNOSIS — I509 Heart failure, unspecified: Secondary | ICD-10-CM | POA: Insufficient documentation

## 2020-06-02 DIAGNOSIS — I240 Acute coronary thrombosis not resulting in myocardial infarction: Secondary | ICD-10-CM

## 2020-06-02 DIAGNOSIS — Z7951 Long term (current) use of inhaled steroids: Secondary | ICD-10-CM | POA: Insufficient documentation

## 2020-06-02 DIAGNOSIS — Z79899 Other long term (current) drug therapy: Secondary | ICD-10-CM | POA: Insufficient documentation

## 2020-06-02 DIAGNOSIS — Z8249 Family history of ischemic heart disease and other diseases of the circulatory system: Secondary | ICD-10-CM | POA: Insufficient documentation

## 2020-06-02 DIAGNOSIS — I428 Other cardiomyopathies: Secondary | ICD-10-CM | POA: Insufficient documentation

## 2020-06-02 DIAGNOSIS — I5022 Chronic systolic (congestive) heart failure: Secondary | ICD-10-CM | POA: Insufficient documentation

## 2020-06-02 DIAGNOSIS — Z7901 Long term (current) use of anticoagulants: Secondary | ICD-10-CM | POA: Insufficient documentation

## 2020-06-02 DIAGNOSIS — Z8616 Personal history of COVID-19: Secondary | ICD-10-CM | POA: Insufficient documentation

## 2020-06-02 DIAGNOSIS — I110 Hypertensive heart disease with heart failure: Secondary | ICD-10-CM | POA: Insufficient documentation

## 2020-06-02 LAB — DIGOXIN LEVEL: Digoxin Level: 0.7 ng/mL — ABNORMAL LOW (ref 1.0–2.0)

## 2020-06-02 MED ORDER — CARVEDILOL 12.5 MG PO TABS
12.5000 mg | ORAL_TABLET | Freq: Two times a day (BID) | ORAL | 3 refills | Status: DC
Start: 2020-06-02 — End: 2020-06-29

## 2020-06-02 NOTE — Progress Notes (Signed)
PCP: Blane Ohara, MD  Cardiology: Dr. Leotis Pain Joel [name] is 24 y.o. male with a hx of hypertension, anxiety and prior COVID-19 infection in June in 2021 who presented to [facility] in 10/21 with shortness of breath and hemoptysis x 1 month.  An echocardiogram demonstrated EF < 20% and an LV thrombus.  A CT scan demonstrated multifocal pneumonia, no PE.  He was started on furosemide and Lovenox and transferred to [facility] for further evaluation and management.  He was started on milrinone with low output by co-ox and diuresed. Cardiac MRI showed severe LV dilation with diffuse hypokinesis, EF 15%, small LV apical thrombus, moderate RV dilation with EF 19%, LGE pattern concerning for myocarditis with diffuse patchy mid-wall LGE.  He was treated with ceftriaxone and doxycycline for PNA. He had a CVA with left-sided weakness and left facial numbness.  MRI showed multifocal bilateral infarcts consistent with cardio-embolism.  He was transitioned from Lovenox/warfarin to IV heparin gtt/warfarin.  Left-sided weakness resolved. He was titrated onto cardiac meds and eventually discharged home.    Today he returns for HF follow up. Last visit lasix was cut back to lasix 20 mg daily and coreg was increased 9.375 mg twice a day. Overall feeling fine. Denies SOB/PND/Orthopnea. Says he gets a little short of breath after if he is walking fast.  Able to walk 1 mile if he takes his time. Appetite ok. No fever or chills. Weight at home  260 pounds. Taking all medications.   Labs (10/21): K 4.9, creatinine 1.06 Labs 05/11/2020: K 5 Creatinine 0.96 Hgb 17.2   PMH: 1. H/o COVID-19 PNA 6/21.  2. HTN 3. Anxiety 4. Chronic systolic CHF: Nonischemic cardiomyopathy.   - Echo (10/21): EF < 20%, LV thrombus.  - Cardiac MRI (10/21): Severe LV dilation with diffuse hypokinesis, EF 15%, small LV apical thrombus, moderate RV dilation with EF 19%, LGE pattern concerning for myocarditis, with diffuse patchy  mid-wall LGE. 5. CVA: 10/21, suspect cardio-embolic.  6. LV apical thrombus 7. Sleep study negative.   Social History   Socioeconomic History   Marital status: Single    Spouse name: Not on file   Number of children: Not on file   Years of education: Not on file   Highest education level: Not on file  Occupational History   Occupation: Full time/Plumbing  Tobacco Use   Smoking status: Former Smoker    Types: E-cigarettes   Smokeless tobacco: Current User    Types: Chew   Tobacco comment: quit around June 2021  Vaping Use   Vaping Use: Former   Substances: Nicotine  Substance and Sexual Activity   Alcohol use: Not Currently    Comment: Patient used to drink occasionally a beer, but stopped when started lexapro   Drug use: Never   Sexual activity: Yes    Birth control/protection: Condom  Other Topics Concern   Not on file  Social History Narrative   Lives with grandmother, grandfather and sister   Right Handed   Drinks rarely caffeine   Social Determinants of Health   Financial Resource Strain:    Difficulty of Paying Living Expenses: Not on file  Food Insecurity:    Worried About Programme researcher, broadcasting/film/video in the Last Year: Not on file   [employer] of Food in the Last Year: Not on file  Transportation Needs:    Lack of Transportation (Medical): Not on file   Lack of Transportation (Non-Medical): Not on file  Physical Activity:  Days of Exercise per Week: Not on file   Minutes of Exercise per Session: Not on file  Stress:    Feeling of Stress : Not on file  Social Connections:    Frequency of Communication with Friends and Family: Not on file   Frequency of Social Gatherings with Friends and Family: Not on file   Attends Religious Services: Not on file   Active Member of Clubs or Organizations: Not on file   Attends Banker Meetings: Not on file   Marital Status: Not on file  Intimate Partner Violence:    Fear of Current or  Ex-Partner: Not on file   Emotionally Abused: Not on file   Physically Abused: Not on file   Sexually Abused: Not on file   Family History  Problem Relation Age of Onset   Heart failure Neg Hx    ROS: All systems reviewed and negative except as per HPI.   Current Outpatient Medications  Medication Sig Dispense Refill   atorvastatin (LIPITOR) 10 MG tablet Take 1 tablet (10 mg total) by mouth daily. 30 tablet 6   carvedilol (COREG) 6.25 MG tablet Take 1.5 tablets (9.375 mg total) by mouth 2 (two) times daily with a meal. 270 tablet 3   dapagliflozin propanediol (FARXIGA) 10 MG TABS tablet Take 1 tablet (10 mg total) by mouth daily. 30 tablet 6   digoxin (LANOXIN) 0.125 MG tablet Take 1 tablet (0.125 mg total) by mouth daily. 30 tablet 6   fexofenadine (ALLEGRA) 60 MG tablet Take 60 mg by mouth daily as needed for allergies or rhinitis.     FLOVENT HFA 110 MCG/ACT inhaler Inhale 2 puffs into the lungs 2 (two) times daily.     furosemide (LASIX) 20 MG tablet Take 1 tablet (20 mg total) by mouth daily. 90 tablet 3   montelukast (SINGULAIR) 10 MG tablet Take 10 mg by mouth at bedtime.     PROAIR HFA 108 (90 Base) MCG/ACT inhaler Inhale 2 puffs into the lungs every 6 (six) hours as needed for shortness of breath.     sacubitril-valsartan (ENTRESTO) 97-103 MG Take 1 tablet by mouth 2 (two) times daily. 60 tablet 6   spironolactone (ALDACTONE) 25 MG tablet Take 1 tablet (25 mg total) by mouth daily. 30 tablet 6   warfarin (COUMADIN) 5 MG tablet Take 2 tablets (10 mg total) by mouth daily. Or as directed 60 tablet 6   No current facility-administered medications for this encounter.   BP 122/90    Pulse 89    Wt 122.9 kg (271 lb)    SpO2 99%    BMI 36.75 kg/m   Wt Readings from Last 3 Encounters:  06/02/20 122.9 kg (271 lb)  05/31/20 122.4 kg (269 lb 12.8 oz)  05/11/20 115.2 kg (254 lb)   General:  Well appearing. No resp difficulty HEENT: normal Neck: supple. no JVD.  Carotids 2+ bilat; no bruits. No lymphadenopathy or thryomegaly appreciated. Cor: PMI nondisplaced. Regular rate & rhythm. No rubs, gallops or murmurs. Lungs: clear Abdomen: soft, nontender, nondistended. No hepatosplenomegaly. No bruits or masses. Good bowel sounds. Extremities: no cyanosis, clubbing, rash, edema Neuro: alert & orientedx3, cranial nerves grossly intact. moves all 4 extremities w/o difficulty. Affect pleasant  Assessment/Plan: 1. Chronic systolic CHF: Appears to be nonischemic cardiomyopathy.  Echo in 10/21 at [facility] with EF <20%, LV thrombus noted. When he presented in 10/21, he had had dyspnea, orthopnea, and pleuritic chest pain since he had COVID-19 in 6/21. He  feels like he never improved. No family history of cardiomyopathy or premature CAD.  HIV negative. CMRI in 10/21 with LVEF 15%, RVEF 19%, LGE pattern concerning for myocarditis. Suspect most likely etiology for cardiomyopathy is viral myocarditis due to COVID-19.  -NYHA II.  Volume status stable. Continue Lasix to 20 mg daily.  - Continue digoxin 0.125 mg. Check dig level. .  - Continue Entresto 97/103 bid.   - Continue spironolactone 25 daily. - Continue Farxiga 10 mg daily  - Increase Coreg to 12.5 mg twice a day.   - He will need echo in 2 months to reassess LV EF with medical management.  2. LV thrombus: On warfarin, INR followed at [facility] in [place].  NO bleeding issues.  3. CVA: Cardioembolic from LV thrombus.  Seems to have minimal residual symptoms (mild tingling left face every now and then). - Continue warfarin.  - Followed by neurology.   Check dig level.  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  Follow up in 3 weeks with Dr [name].  Follow up in 2 months with Dr [name] and an ECHO  Greater than 50% of the (total minutes 25) visit spent in counseling/coordination of care regarding the above.     Zeda Gangwer NP-C  06/02/2020 11:38 AM

## 2020-06-02 NOTE — Patient Instructions (Signed)
Labs done today. We will contact you only if your labs are abnormal.  INCREASE Carvedilol 12.5mg ( 1 tablet) by mouth 2 times daily.  No other medication changes were made. Please continue all other medications as prescribed.  Your physician recommends that you schedule a follow-up appointment in: 3 weeks with Dr. [name] and in 2 months with Dr. [name] with an echo prior to your appointment.  If you have any questions or concerns before your next appointment please send [place] a message through [place] or call our office at [phone].    TO LEAVE A MESSAGE FOR THE NURSE SELECT OPTION 2, PLEASE LEAVE A MESSAGE INCLUDING: . YOUR NAME . DATE OF BIRTH . CALL BACK NUMBER . REASON FOR CALL**this is important as we prioritize the call backs  YOU WILL RECEIVE A CALL BACK THE SAME DAY AS LONG AS YOU CALL BEFORE 4:00 PM   Do the following things EVERYDAY: 1) Weigh yourself in the morning before breakfast. Write it down and keep it in a log. 2) Take your medicines as prescribed 3) Eat low salt foods--Limit salt (sodium) to 2000 mg per day.  4) Stay as active as you can everyday 5) Limit all fluids for the day to less than 2 liters    At the [facility], you and your health needs are our priority. As part of our continuing mission to provide you with exceptional heart care, we have created designated Provider Care Teams. These Care Teams include your primary Cardiologist (physician) and Advanced Practice Providers (APPs- Physician Assistants and Nurse Practitioners) who all work together to provide you with the care you need, when you need it.   You may see any of the following providers on your designated Care Team at your next follow up: Marland Kitchen Dr Arvilla Meres . Dr [name] . Tonye Becket, NP . Robbie Lis, PA . Karle Plumber, PharmD   Please be sure to bring in all your medications bottles to every appointment.

## 2020-06-14 ENCOUNTER — Other Ambulatory Visit: Payer: Self-pay

## 2020-06-14 ENCOUNTER — Ambulatory Visit (INDEPENDENT_AMBULATORY_CARE_PROVIDER_SITE_OTHER): Payer: Self-pay

## 2020-06-14 DIAGNOSIS — I240 Acute coronary thrombosis not resulting in myocardial infarction: Secondary | ICD-10-CM

## 2020-06-14 DIAGNOSIS — Z5181 Encounter for therapeutic drug level monitoring: Secondary | ICD-10-CM

## 2020-06-14 LAB — POCT INR: INR: 2.5 (ref 2.0–3.0)

## 2020-06-14 NOTE — Patient Instructions (Signed)
Continue taking warfarin 1 tablet daily. Recheck INR in 6 weeks. [facility] Clinic [phone] 

## 2020-06-29 ENCOUNTER — Encounter (HOSPITAL_COMMUNITY): Payer: Self-pay | Admitting: Cardiology

## 2020-06-29 ENCOUNTER — Other Ambulatory Visit: Payer: Self-pay

## 2020-06-29 ENCOUNTER — Ambulatory Visit (HOSPITAL_COMMUNITY)
Admission: RE | Admit: 2020-06-29 | Discharge: 2020-06-29 | Disposition: A | Discharge status: Home / Self Care | Payer: Self-pay | Source: Ambulatory Visit | Attending: Cardiology | Admitting: Cardiology

## 2020-06-29 VITALS — BP 118/90 | HR 82 | Wt 276.6 lb

## 2020-06-29 DIAGNOSIS — Z8616 Personal history of COVID-19: Secondary | ICD-10-CM | POA: Insufficient documentation

## 2020-06-29 DIAGNOSIS — I69998 Other sequelae following unspecified cerebrovascular disease: Secondary | ICD-10-CM | POA: Insufficient documentation

## 2020-06-29 DIAGNOSIS — Z7984 Long term (current) use of oral hypoglycemic drugs: Secondary | ICD-10-CM | POA: Insufficient documentation

## 2020-06-29 DIAGNOSIS — Z7901 Long term (current) use of anticoagulants: Secondary | ICD-10-CM | POA: Insufficient documentation

## 2020-06-29 DIAGNOSIS — I513 Intracardiac thrombosis, not elsewhere classified: Secondary | ICD-10-CM | POA: Insufficient documentation

## 2020-06-29 DIAGNOSIS — R202 Paresthesia of skin: Secondary | ICD-10-CM | POA: Insufficient documentation

## 2020-06-29 DIAGNOSIS — Z87891 Personal history of nicotine dependence: Secondary | ICD-10-CM | POA: Insufficient documentation

## 2020-06-29 DIAGNOSIS — I5022 Chronic systolic (congestive) heart failure: Secondary | ICD-10-CM

## 2020-06-29 DIAGNOSIS — I110 Hypertensive heart disease with heart failure: Secondary | ICD-10-CM | POA: Insufficient documentation

## 2020-06-29 DIAGNOSIS — Z79899 Other long term (current) drug therapy: Secondary | ICD-10-CM | POA: Insufficient documentation

## 2020-06-29 LAB — HEPATIC FUNCTION PANEL
ALT: 42 U/L (ref 0–44)
AST: 24 U/L (ref 15–41)
Albumin: 4.1 g/dL (ref 3.5–5.0)
Alkaline Phosphatase: 47 U/L (ref 38–126)
Bilirubin, Direct: <0.1 mg/dL (ref 0.0–0.2)
Total Bilirubin: 0.7 mg/dL (ref 0.3–1.2)
Total Protein: 7.2 g/dL (ref 6.5–8.1)

## 2020-06-29 LAB — BASIC METABOLIC PANEL
Anion gap: 10 (ref 5–15)
BUN: 21 mg/dL — ABNORMAL HIGH (ref 6–20)
CO2: 23 mmol/L (ref 22–32)
Calcium: 9.5 mg/dL (ref 8.9–10.3)
Chloride: 103 mmol/L (ref 98–111)
Creatinine, Ser: 0.84 mg/dL (ref 0.61–1.24)
GFR, Estimated: >60 mL/min (ref >60)
Glucose, Bld: 97 mg/dL (ref 70–99)
Potassium: 4.3 mmol/L (ref 3.5–5.1)
Sodium: 136 mmol/L (ref 135–145)

## 2020-06-29 LAB — LIPID PANEL
Cholesterol: 153 mg/dL (ref 0–200)
HDL: 50 mg/dL (ref >40)
LDL Cholesterol: 88 mg/dL (ref 0–99)
Total CHOL/HDL Ratio: 3.1 RATIO
Triglycerides: 74 mg/dL (ref <150)
VLDL: 15 mg/dL (ref 0–40)

## 2020-06-29 LAB — DIGOXIN LEVEL: Digoxin Level: 0.5 ng/mL — ABNORMAL LOW (ref 0.8–2.0)

## 2020-06-29 MED ORDER — CARVEDILOL 12.5 MG PO TABS
18.7500 mg | ORAL_TABLET | Freq: Two times a day (BID) | ORAL | 3 refills | Status: DC
Start: 2020-06-29 — End: 2020-07-20

## 2020-06-29 NOTE — Patient Instructions (Addendum)
Labs done today. We will contact you only if your labs are abnormal.  INCREASE Carvedilol 18.75mg  (1 & 1/2 tablets) by mouth 2 times daily.  No other medication changes were made. Please continue all other medications as prescribed.   Your physician recommends that you schedule a follow-up appointment in: 3 weeks for an appointment   If you have any questions or concerns before your next appointment please send [place] a message through [place] or call our office at [phone].    TO LEAVE A MESSAGE FOR THE NURSE SELECT OPTION 2, PLEASE LEAVE A MESSAGE INCLUDING: . YOUR NAME . DATE OF BIRTH . CALL BACK NUMBER . REASON FOR CALL**this is important as we prioritize the call backs  YOU WILL RECEIVE A CALL BACK THE SAME DAY AS LONG AS YOU CALL BEFORE 4:00 PM   Do the following things EVERYDAY: 1) Weigh yourself in the morning before breakfast. Write it down and keep it in a log. 2) Take your medicines as prescribed 3) Eat low salt foods--Limit salt (sodium) to 2000 mg per day.  4) Stay as active as you can everyday 5) Limit all fluids for the day to less than 2 liters  At the [facility], you and your health needs are our priority. As part of our continuing mission to provide you with exceptional heart care, we have created designated Provider Care Teams. These Care Teams include your primary Cardiologist (physician) and Advanced Practice Providers (APPs- Physician Assistants and Nurse Practitioners) who all work together to provide you with the care you need, when you need it.   You may see any of the following providers on your designated Care Team at your next follow up: Marland Kitchen Dr Arvilla Meres . Dr [name] . Tonye Becket, NP . Robbie Lis, PA . Karle Plumber, PharmD   Please be sure to bring in all your medications bottles to every appointment.

## 2020-06-30 NOTE — Progress Notes (Signed)
PCP: Blane Ohara, MD  Cardiology: Dr. Leotis Pain Joel [name] is 24 y.o. male with a hx of hypertension, anxiety and prior COVID-19 infection in June in 2021 who presented to [facility] in 10/21 with shortness of breath and hemoptysis x 1 month.  An echocardiogram demonstrated EF < 20% and an LV thrombus.  A CT scan demonstrated multifocal pneumonia, no PE.  He was started on furosemide and Lovenox and transferred to [facility] for further evaluation and management.  He was started on milrinone with low output by co-ox and diuresed. Cardiac MRI showed severe LV dilation with diffuse hypokinesis, EF 15%, small LV apical thrombus, moderate RV dilation with EF 19%, LGE pattern concerning for myocarditis with diffuse patchy mid-wall LGE.  He was treated with ceftriaxone and doxycycline for PNA. He had a CVA with left-sided weakness and left facial numbness.  MRI showed multifocal bilateral infarcts consistent with cardio-embolism.  He was transitioned from Lovenox/warfarin to IV heparin gtt/warfarin.  Left-sided weakness resolved. He was titrated onto cardiac meds and eventually discharged home.    He returns today for followup of CHF.  He continues to do well symptomatically.  No significant exertional dyspnea. No orthopnea/PND.  No lightheadedness.  Taking warfarin without BRBPR/melena.  Still with mild tingling left face.  Working full time but on light duties.   Labs (10/21): K 4.9, creatinine 1.06 Labs (11/21): K 5, creatinine 0.96 Labs (12/21): digoxin 0.7  PMH: 1. H/o COVID-19 PNA 6/21.  2. HTN 3. Anxiety 4. Chronic systolic CHF: Nonischemic cardiomyopathy.   - Echo (10/21): EF < 20%, LV thrombus.  - Cardiac MRI (10/21): Severe LV dilation with diffuse hypokinesis, EF 15%, small LV apical thrombus, moderate RV dilation with EF 19%, LGE pattern concerning for myocarditis, with diffuse patchy mid-wall LGE. 5. CVA: 10/21, suspect cardio-embolic.  6. LV apical thrombus 7. Sleep study  negative.   Social History   Socioeconomic History   Marital status: Single    Spouse name: Not on file   Number of children: Not on file   Years of education: Not on file   Highest education level: Not on file  Occupational History   Occupation: Full time/Plumbing  Tobacco Use   Smoking status: Former Smoker    Types: E-cigarettes   Smokeless tobacco: Current User    Types: Chew   Tobacco comment: quit around June 2021  Vaping Use   Vaping Use: Former   Substances: Nicotine  Substance and Sexual Activity   Alcohol use: Not Currently    Comment: Patient used to drink occasionally a beer, but stopped when started lexapro   Drug use: Never   Sexual activity: Yes    Birth control/protection: Condom  Other Topics Concern   Not on file  Social History Narrative   Lives with grandmother, grandfather and sister   Right Handed   Drinks rarely caffeine   Social Determinants of Health   Financial Resource Strain: Not on file  Food Insecurity: Not on file  Transportation Needs: Not on file  Physical Activity: Not on file  Stress: Not on file  Social Connections: Not on file  Intimate Partner Violence: Not on file   Family History  Problem Relation Age of Onset   Heart failure Neg Hx    ROS: All systems reviewed and negative except as per HPI.   Current Outpatient Medications  Medication Sig Dispense Refill   atorvastatin (LIPITOR) 10 MG tablet Take 1 tablet (10 mg total) by mouth daily. 30  tablet 6   dapagliflozin propanediol (FARXIGA) 10 MG TABS tablet Take 1 tablet (10 mg total) by mouth daily. 30 tablet 6   digoxin (LANOXIN) 0.125 MG tablet Take 1 tablet (0.125 mg total) by mouth daily. 30 tablet 6   fexofenadine (ALLEGRA) 60 MG tablet Take 60 mg by mouth daily as needed for allergies or rhinitis.     furosemide (LASIX) 20 MG tablet Take 1 tablet (20 mg total) by mouth daily. 90 tablet 3   montelukast (SINGULAIR) 10 MG tablet Take 10 mg by mouth  at bedtime.     sacubitril-valsartan (ENTRESTO) 97-103 MG Take 1 tablet by mouth 2 (two) times daily. 60 tablet 6   spironolactone (ALDACTONE) 25 MG tablet Take 1 tablet (25 mg total) by mouth daily. 30 tablet 6   warfarin (COUMADIN) 5 MG tablet Take 2 tablets (10 mg total) by mouth daily. Or as directed 60 tablet 6   carvedilol (COREG) 12.5 MG tablet Take 1.5 tablets (18.75 mg total) by mouth 2 (two) times daily with a meal. 270 tablet 3   No current facility-administered medications for this encounter.   BP 118/90    Pulse 82    Wt 125.5 kg (276 lb 9.6 oz)    SpO2 98%    BMI 37.51 kg/m  General: NAD Neck: No JVD, no thyromegaly or thyroid nodule.  Lungs: Clear to auscultation bilaterally with normal respiratory effort. CV: Nondisplaced PMI.  Heart regular S1/S2, no S3/S4, no murmur.  No peripheral edema.  No carotid bruit.  Normal pedal pulses.  Abdomen: Soft, nontender, no hepatosplenomegaly, no distention.  Skin: Intact without lesions or rashes.  Neurologic: Alert and oriented x 3.  Psych: Normal affect. Extremities: No clubbing or cyanosis.  HEENT: Normal.   Assessment/Plan: 1. Chronic systolic CHF: Appears to be nonischemic cardiomyopathy.  Echo in 10/21 at [facility] with EF <20%, LV thrombus noted. When he presented in 10/21, he had had dyspnea, orthopnea, and pleuritic chest pain since he had COVID-19 in 6/21. He feels like he never improved. No family history of cardiomyopathy or premature CAD.  HIV negative. CMRI in 10/21 with LVEF 15%, RVEF 19%, LGE pattern concerning for myocarditis. Suspect most likely etiology for cardiomyopathy is viral myocarditis due to COVID-19. He is now NYHA class I-II, taking medications as ordered and not volume overloaded.  - Continue Lasix 20 mg daily.  BMET today.   - Continue digoxin 0.125 mg. Needs digoxin level.  - Continue Entresto 97/103 bid.   - Continue spironolactone 25 daily. - Continue Farxiga 10 mg daily  - Increase Coreg to  18.75 mg bid.  - He will need echo in 3 months to reassess LV EF with medical management, ?ICD (not CRT candidate with narrow QRS).  2. LV thrombus: On warfarin, INR followed at [facility] in [place].  3. CVA: Cardioembolic from LV thrombus.  Seems to have minimal residual symptoms (mild tingling left face). - Continue warfarin.   Followup in 3 wks with HF pharmacist to try to increase Coreg to 25 mg bid.  See me in 3 months with echo.   [name] 06/30/2020

## 2020-07-04 NOTE — Progress Notes (Signed)
PCP: Blane Ohara, MD  Cardiology: Dr. [name]  HPI:  Joel [name] 24 y.o.malewith a hx ofhypertension,anxiety and prior COVID-19 infection in June in 2021 who presented to [facility] in 10/21 with shortness of breath and hemoptysis x 1 month. An echocardiogram demonstrated EF <20% and an LV thrombus. A CT scan demonstrated multifocal pneumonia, no PE. He was started on furosemide and enoxaparin and transferred to [facility] for further evaluation and management. He was started on milrinone with low output by co-ox and diuresed. Cardiac MRI showed severe LV dilation with diffuse hypokinesis, EF 15%, small LV apical thrombus, moderate RV dilation with EF 19%, LGE pattern concerning for myocarditis with diffuse patchy mid-wall LGE.  He was treated with ceftriaxone and doxycycline for PNA. He had a CVA with left-sided weakness and left facial numbness.  MRI showed multifocal bilateral infarcts consistent with cardio-embolism.  He was transitioned from enoxparin/warfarin to IV heparin gtt/warfarin.  Left-sided weakness resolved. He was titrated onto cardiac meds and eventually discharged home.    He recently returned to HF Clinic for followup of CHF on 05/04/20.  He continued to do well symptomatically.  No significant exertional dyspnea. No orthopnea/PND.  No lightheadedness.  Was taking warfarin without BRBPR/melena.  Still had mild tingling of left face.  Was working full time but on light duties.   Today he returns to HF clinic for pharmacist medication titration. At last visit with MD carvedilol was increased to 18.75 mg BID. Overall he is feeling well today. No dizziness, lightheadedness, chest pain or palpitations. No complaints of SOB/DOE. He takes furosemide 20 mg daily and has not needed any extra. He does not weigh himself daily at home. Weight has been increasing at clinic visits, but he attributes this to dietary indiscretions.  No LEE, PND or orthopnea. Recently got [employer].    HF Medications: Carvedilol 18.75 mg BID Entresto 97/103 mg BID Spironolactone 25 mg daily Farxiga 10 mg daily Digoxin 0.125 mg daily Furosemide 20 mg daily  Has the patient been experiencing any side effects to the medications prescribed?  no  Does the patient have any problems obtaining medications due to transportation or finances?   no  Understanding of regimen: good Understanding of indications: good Potential of compliance: good Patient understands to avoid NSAIDs. Patient understands to avoid decongestants.    Pertinent Lab Values: . 06/29/20: Serum creatinine 0.84, BUN 21, Potassium 4.3, Sodium 136  Vital Signs: . Weight: 282 lbs (last clinic weight: 276.6 lbs) . Blood pressure: 122/74  . Heart rate: 64   Assessment: 1. Chronic systolic CHF: Appears to be nonischemic cardiomyopathy.  Echo in 10/21 at [facility] with EF <20%, LV thrombus noted. When he presented in 10/21, he had had dyspnea, orthopnea, and pleuritic chest pain since he had COVID-19 in 6/21. He feels like he never improved. No family history of cardiomyopathy or premature CAD. HIV negative. CMRI in 10/21 with LVEF 15%, RVEF 19%, LGE pattern concerning for myocarditis. Suspect most likely etiology for cardiomyopathy is viral myocarditis due to COVID-19.  - He is now NYHA class I-II, appears euvolemic on exam.  -Continue furosemide 20 mg daily. - Increase carvedilol to 25 mg BID   - Continue Entresto 97/103 mg BID.  - Continue spironolactone 25 daily. - Continue Farxiga 10 mg daily  - Continue digoxin 0.125 mg daily - He will need echo in 3 months to reassess LV EF with medical management, ?ICD (not CRT candidate with narrow QRS).  2. LV  thrombus:On warfarin,INR followed at [facility] in [place].  3. CVA: Cardioembolic from LV thrombus. Seems to have minimal residual symptoms (mild tingling left face). - Continue warfarin.    Plan: 1) Medication changes: Based on  clinical presentation, vital signs and recent labs will increase carvedilol to 25 mg BID. 2) Follow-up: 2 months with Dr. Jonn Shingles, PharmD, BCPS, [facility], [facility] Pharmacist [phone]

## 2020-07-07 DIAGNOSIS — Z20828 Contact with and (suspected) exposure to other viral communicable diseases: Secondary | ICD-10-CM | POA: Diagnosis not present

## 2020-07-07 DIAGNOSIS — J00 Acute nasopharyngitis [common cold]: Secondary | ICD-10-CM | POA: Diagnosis not present

## 2020-07-14 ENCOUNTER — Inpatient Hospital Stay: Payer: MEDICAID | Admitting: Neurology

## 2020-07-14 ENCOUNTER — Ambulatory Visit: Payer: Self-pay | Admitting: Neurology

## 2020-07-19 ENCOUNTER — Other Ambulatory Visit (HOSPITAL_COMMUNITY): Payer: Self-pay | Admitting: Cardiology

## 2020-07-20 ENCOUNTER — Other Ambulatory Visit: Payer: Self-pay | Admitting: Pharmacist

## 2020-07-20 ENCOUNTER — Other Ambulatory Visit: Payer: Self-pay

## 2020-07-20 ENCOUNTER — Ambulatory Visit (HOSPITAL_COMMUNITY)
Admission: RE | Admit: 2020-07-20 | Discharge: 2020-07-20 | Disposition: A | Discharge status: Home / Self Care | Payer: BC Managed Care – PPO | Source: Ambulatory Visit | Attending: Cardiology | Admitting: Cardiology

## 2020-07-20 VITALS — BP 122/74 | HR 64 | Wt 282.0 lb

## 2020-07-20 DIAGNOSIS — Z8673 Personal history of transient ischemic attack (TIA), and cerebral infarction without residual deficits: Secondary | ICD-10-CM | POA: Insufficient documentation

## 2020-07-20 DIAGNOSIS — Z8616 Personal history of COVID-19: Secondary | ICD-10-CM | POA: Insufficient documentation

## 2020-07-20 DIAGNOSIS — I5022 Chronic systolic (congestive) heart failure: Secondary | ICD-10-CM | POA: Insufficient documentation

## 2020-07-20 DIAGNOSIS — I428 Other cardiomyopathies: Secondary | ICD-10-CM | POA: Diagnosis not present

## 2020-07-20 DIAGNOSIS — Z7901 Long term (current) use of anticoagulants: Secondary | ICD-10-CM | POA: Insufficient documentation

## 2020-07-20 DIAGNOSIS — I110 Hypertensive heart disease with heart failure: Secondary | ICD-10-CM | POA: Diagnosis not present

## 2020-07-20 MED ORDER — CARVEDILOL 25 MG PO TABS
25.0000 mg | ORAL_TABLET | Freq: Two times a day (BID) | ORAL | 3 refills | Status: DC
Start: 2020-07-20 — End: 2021-07-13

## 2020-07-20 MED ORDER — WARFARIN SODIUM 5 MG PO TABS: 5.0000 mg | ORAL_TABLET | Freq: Every day | ORAL | 2 refills | Status: DC

## 2020-07-20 NOTE — Patient Instructions (Signed)
It was a pleasure seeing you today!  MEDICATIONS: -We are changing your medications today -Increase carvedilol to 25 mg (1 tablet) twice daily.  -Call if you have questions about your medications.  NEXT APPOINTMENT: Return to clinic in 2 months with Dr. McLean.  In general, to take care of your heart failure: -Limit your fluid intake to 2 Liters (half-gallon) per day.   -Limit your salt intake to ideally 2-3 grams (2000-3000 mg) per day. -Weigh yourself daily and record, and bring that "weight diary" to your next appointment.  (Weight gain of 2-3 pounds in 1 day typically means fluid weight.) -The medications for your heart are to help your heart and help you live longer.   -Please contact us before stopping any of your heart medications.  Call the clinic at [phone] with questions or to reschedule future appointments.  

## 2020-07-26 ENCOUNTER — Other Ambulatory Visit: Payer: Self-pay

## 2020-07-26 ENCOUNTER — Ambulatory Visit (INDEPENDENT_AMBULATORY_CARE_PROVIDER_SITE_OTHER): Payer: BC Managed Care – PPO

## 2020-07-26 DIAGNOSIS — Z5181 Encounter for therapeutic drug level monitoring: Secondary | ICD-10-CM

## 2020-07-26 DIAGNOSIS — I240 Acute coronary thrombosis not resulting in myocardial infarction: Secondary | ICD-10-CM | POA: Diagnosis not present

## 2020-07-26 LAB — POCT INR: INR: 2.2 (ref 2.0–3.0)

## 2020-07-26 NOTE — Patient Instructions (Signed)
Continue taking warfarin 1 tablet daily. Recheck INR in 6 weeks. [facility] Clinic [phone] 

## 2020-08-25 ENCOUNTER — Other Ambulatory Visit (HOSPITAL_COMMUNITY): Payer: Self-pay

## 2020-08-25 MED ORDER — SACUBITRIL-VALSARTAN 97-103 MG PO TABS: 1.0000 | ORAL_TABLET | Freq: Two times a day (BID) | ORAL | 1 refills | Status: DC

## 2020-08-29 ENCOUNTER — Other Ambulatory Visit (HOSPITAL_COMMUNITY): Payer: Self-pay | Admitting: *Deleted

## 2020-08-29 DIAGNOSIS — I5022 Chronic systolic (congestive) heart failure: Secondary | ICD-10-CM

## 2020-08-30 ENCOUNTER — Other Ambulatory Visit: Payer: Self-pay

## 2020-08-30 ENCOUNTER — Ambulatory Visit: Payer: Self-pay | Admitting: Family Medicine

## 2020-08-30 ENCOUNTER — Ambulatory Visit (HOSPITAL_COMMUNITY)
Admission: RE | Admit: 2020-08-30 | Discharge: 2020-08-30 | Disposition: A | Discharge status: Home / Self Care | Payer: BC Managed Care – PPO | Source: Ambulatory Visit | Attending: Cardiology | Admitting: Cardiology

## 2020-08-30 ENCOUNTER — Encounter (HOSPITAL_COMMUNITY): Payer: Self-pay | Admitting: Cardiology

## 2020-08-30 ENCOUNTER — Ambulatory Visit (HOSPITAL_BASED_OUTPATIENT_CLINIC_OR_DEPARTMENT_OTHER)
Admission: RE | Admit: 2020-08-30 | Discharge: 2020-08-30 | Disposition: A | Discharge status: Home / Self Care | Payer: BC Managed Care – PPO | Source: Ambulatory Visit

## 2020-08-30 VITALS — BP 128/88 | HR 65 | Wt 280.0 lb

## 2020-08-30 DIAGNOSIS — I429 Cardiomyopathy, unspecified: Secondary | ICD-10-CM | POA: Insufficient documentation

## 2020-08-30 DIAGNOSIS — I5022 Chronic systolic (congestive) heart failure: Secondary | ICD-10-CM | POA: Insufficient documentation

## 2020-08-30 DIAGNOSIS — Z8616 Personal history of COVID-19: Secondary | ICD-10-CM | POA: Diagnosis not present

## 2020-08-30 DIAGNOSIS — I110 Hypertensive heart disease with heart failure: Secondary | ICD-10-CM | POA: Insufficient documentation

## 2020-08-30 DIAGNOSIS — I639 Cerebral infarction, unspecified: Secondary | ICD-10-CM | POA: Diagnosis not present

## 2020-08-30 DIAGNOSIS — I219 Acute myocardial infarction, unspecified: Secondary | ICD-10-CM | POA: Insufficient documentation

## 2020-08-30 LAB — BASIC METABOLIC PANEL
Anion gap: 10 (ref 5–15)
BUN: 19 mg/dL (ref 6–20)
CO2: 23 mmol/L (ref 22–32)
Calcium: 9.6 mg/dL (ref 8.9–10.3)
Chloride: 104 mmol/L (ref 98–111)
Creatinine, Ser: 1.04 mg/dL (ref 0.61–1.24)
GFR, Estimated: >60 mL/min (ref >60)
Glucose, Bld: 102 mg/dL — ABNORMAL HIGH (ref 70–99)
Potassium: 4.1 mmol/L (ref 3.5–5.1)
Sodium: 137 mmol/L (ref 135–145)

## 2020-08-30 LAB — CBC
HCT: 44.9 % (ref 39.0–52.0)
Hemoglobin: 15 g/dL (ref 13.0–17.0)
MCH: 30.5 pg (ref 26.0–34.0)
MCHC: 33.4 g/dL (ref 30.0–36.0)
MCV: 91.3 fL (ref 80.0–100.0)
Platelets: 212 10*3/uL (ref 150–400)
RBC: 4.92 MIL/uL (ref 4.22–5.81)
RDW: 14 % (ref 11.5–15.5)
WBC: 5.6 10*3/uL (ref 4.0–10.5)
nRBC: 0 % (ref 0.0–0.2)

## 2020-08-30 LAB — ECHOCARDIOGRAM COMPLETE
Area-P 1/2: 4.06 cm2
S' Lateral: 4.6 cm
Single Plane A2C EF: 44.1 %

## 2020-08-30 LAB — DIGOXIN LEVEL: Digoxin Level: <0.2 ng/mL — ABNORMAL LOW (ref 0.8–2.0)

## 2020-08-30 NOTE — Patient Instructions (Addendum)
EKG done today.  Labs done today. We will contact you only if your labs are abnormal.  STOP taking Lasix  No other medication changes were made. Please continue all current medications as prescribed.  Your physician recommends that you schedule a follow-up appointment in: 3 months for a lab only appointment and in 6 months for an appointment with Dr. [name] with an echo prior to your exam. Please contact our office in August to schedule a September appointment.  Your physician has requested that you have an echocardiogram. Echocardiography is a painless test that uses sound waves to create images of your heart. It provides your doctor with information about the size and shape of your heart and how well your heart's chambers and valves are working. This procedure takes approximately one hour. There are no restrictions for this procedure.   If you have any questions or concerns before your next appointment please send [place] a message through [place] or call our office at [phone].    TO LEAVE A MESSAGE FOR THE NURSE SELECT OPTION 2, PLEASE LEAVE A MESSAGE INCLUDING: . YOUR NAME . DATE OF BIRTH . CALL BACK NUMBER . REASON FOR CALL**this is important as we prioritize the call backs  YOU WILL RECEIVE A CALL BACK THE SAME DAY AS LONG AS YOU CALL BEFORE 4:00 PM   Do the following things EVERYDAY: 1) Weigh yourself in the morning before breakfast. Write it down and keep it in a log. 2) Take your medicines as prescribed 3) Eat low salt foods--Limit salt (sodium) to 2000 mg per day.  4) Stay as active as you can everyday 5) Limit all fluids for the day to less than 2 liters   At the [facility], you and your health needs are our priority. As part of our continuing mission to provide you with exceptional heart care, we have created designated Provider Care Teams. These Care Teams include your primary Cardiologist (physician) and Advanced Practice Providers (APPs- Physician  Assistants and Nurse Practitioners) who all work together to provide you with the care you need, when you need it.   You may see any of the following providers on your designated Care Team at your next follow up: Marland Kitchen Dr Arvilla Meres . Dr [name] . Tonye Becket, NP . Robbie Lis, PA . Karle Plumber, PharmD   Please be sure to bring in all your medications bottles to every appointment.

## 2020-08-30 NOTE — Progress Notes (Signed)
  Echocardiogram 2D Echocardiogram has been performed.  Joel [name] 08/30/2020, 11:47 AM

## 2020-08-31 NOTE — Progress Notes (Signed)
PCP: Joel Ohara, MD  Cardiology: Dr. [name] is 25 y.o. male with a hx of hypertension, anxiety and prior COVID-19 infection in June in 2021 who presented to [facility] in 10/21 with shortness of breath and hemoptysis x 1 month.  An echocardiogram demonstrated EF < 20% and an LV thrombus.  A CT scan demonstrated multifocal pneumonia, no PE.  He was started on furosemide and Lovenox and transferred to [facility] for further evaluation and management.  He was started on milrinone with low output by co-ox and diuresed. Cardiac MRI showed severe LV dilation with diffuse hypokinesis, EF 15%, small LV apical thrombus, moderate RV dilation with EF 19%, LGE pattern concerning for myocarditis with diffuse patchy mid-wall LGE.  He was treated with ceftriaxone and doxycycline for PNA. He had a CVA with left-sided weakness and left facial numbness.  MRI showed multifocal bilateral infarcts consistent with cardio-embolism.  He was transitioned from Lovenox/warfarin to IV heparin gtt/warfarin.  Left-sided weakness resolved. He was titrated onto cardiac meds and eventually discharged home.   Echo was done today and reviewed, EF 35% with global hypokinesis, mild LV dilation, mildly decreased RV systolic function. No LV thrombus.    He returns today for followup of CHF.  He is doing well symptomatically, no exertional dyspnea.  No lightheadedness.  No chest pain.  No palpitations.  He is working full time. Weight is up 4 lbs.   Labs (10/21): K 4.9, creatinine 1.06 Labs (11/21): K 5, creatinine 0.96 Labs (12/21): digoxin 0.7, LDL 80, HDL 50, K 4.3, creatinine 4.25  ECG (personally reviewed): NSR, nonspecific T wave flattening  PMH: 1. H/o COVID-19 PNA 6/21.  2. HTN 3. Anxiety 4. Chronic systolic CHF: Nonischemic cardiomyopathy.   - Echo (10/21): EF < 20%, LV thrombus.  - Cardiac MRI (10/21): Severe LV dilation with diffuse hypokinesis, EF 15%, small LV apical thrombus, moderate RV  dilation with EF 19%, LGE pattern concerning for myocarditis, with diffuse patchy mid-wall LGE. - Echo (3/22): EF 35% with global hypokinesis, mild LV dilation, mildly decreased RV systolic function. No LV thrombus. 5. CVA: 10/21, suspect cardio-embolic.  6. LV apical thrombus 7. Sleep study negative.   Social History   Socioeconomic History  . Marital status: Single    Spouse name: Not on file  . Number of children: Not on file  . Years of education: Not on file  . Highest education level: Not on file  Occupational History  . Occupation: Full time/Plumbing  Tobacco Use  . Smoking status: Former Smoker    Types: E-cigarettes  . Smokeless tobacco: Current User    Types: Chew  . Tobacco comment: quit around June 2021  Vaping Use  . Vaping Use: Former  . Substances: Nicotine  Substance and Sexual Activity  . Alcohol use: Not Currently    Comment: Patient used to drink occasionally a beer, but stopped when started lexapro  . Drug use: Never  . Sexual activity: Yes    Birth control/protection: Condom  Other Topics Concern  . Not on file  Social History Narrative   Lives with grandmother, grandfather and sister   Right Handed   Drinks rarely caffeine   Social Determinants of Health   Financial Resource Strain: Not on file  Food Insecurity: Not on file  Transportation Needs: Not on file  Physical Activity: Not on file  Stress: Not on file  Social Connections: Not on file  Intimate Partner Violence: Not on file   Family  History  Problem Relation Age of Onset  . Heart failure Neg Hx    ROS: All systems reviewed and negative except as per HPI.   Current Outpatient Medications  Medication Sig Dispense Refill  . atorvastatin (LIPITOR) 10 MG tablet Take 1 tablet (10 mg total) by mouth daily. 30 tablet 6  . carvedilol (COREG) 25 MG tablet Take 1 tablet (25 mg total) by mouth 2 (two) times daily with a meal. 180 tablet 3  . dapagliflozin propanediol (FARXIGA) 10 MG TABS  tablet Take 1 tablet (10 mg total) by mouth daily. 30 tablet 6  . digoxin (LANOXIN) 0.125 MG tablet Take 1 tablet (0.125 mg total) by mouth daily. 30 tablet 6  . fexofenadine (ALLEGRA) 60 MG tablet Take 60 mg by mouth daily as needed for allergies or rhinitis.    Marland Kitchen montelukast (SINGULAIR) 10 MG tablet Take 10 mg by mouth at bedtime.    . sacubitril-valsartan (ENTRESTO) 97-103 MG Take 1 tablet by mouth 2 (two) times daily. Needs appointment for further refill. 60 tablet 1  . spironolactone (ALDACTONE) 25 MG tablet Take 1 tablet (25 mg total) by mouth daily. 30 tablet 6  . warfarin (COUMADIN) 5 MG tablet Take 1 tablet (5 mg total) by mouth daily. Or as directed 30 tablet 2   No current facility-administered medications for this encounter.   BP 128/88   Pulse 65   Wt 127 kg (280 lb)   SpO2 98%   BMI 37.97 kg/m  General: NAD Neck: No JVD, no thyromegaly or thyroid nodule.  Lungs: Clear to auscultation bilaterally with normal respiratory effort. CV: Nondisplaced PMI.  Heart regular S1/S2, no S3/S4, no murmur.  No peripheral edema.  No carotid bruit.  Normal pedal pulses.  Abdomen: Soft, nontender, no hepatosplenomegaly, no distention.  Skin: Intact without lesions or rashes.  Neurologic: Alert and oriented x 3.  Psych: Normal affect. Extremities: No clubbing or cyanosis.  HEENT: Normal.   Assessment/Plan: 1. Chronic systolic CHF: Appears to be nonischemic cardiomyopathy.  Echo in 10/21 at [facility] with EF <20%, LV thrombus noted. When he presented in 10/21, he had had dyspnea, orthopnea, and pleuritic chest pain since he had COVID-19 in 6/21. He feels like he never improved. No family history of cardiomyopathy or premature CAD.  HIV negative. CMRI in 10/21 with LVEF 15%, RVEF 19%, LGE pattern concerning for myocarditis. Suspect most likely etiology for cardiomyopathy is viral myocarditis due to COVID-19.  Echo was done today, showing improved EF to 35%. NYHA class I, not volume overloaded.   - I think he can stop Lasix today.   - Continue digoxin 0.125 mg. Needs digoxin level.  - Continue Entresto 97/103 bid.   - Continue spironolactone 25 daily.  BMET today.  - Continue Farxiga 10 mg daily  - Continue Coreg 25 mg bid.  - EF has improved, now about 35%.  Will repeat echo in 6 months, hopefully will be out of ICD range at that point.  2. LV thrombus: On warfarin, INR followed at [facility] in [place]. No thrombus seen on today's echo.  3. CVA: Cardioembolic from LV thrombus.  Seems to have minimal residual symptoms (mild tingling left face). - Continue warfarin.   BMET in 3 months, see me in 6 months with echo.    [name] 08/31/2020

## 2020-09-06 ENCOUNTER — Ambulatory Visit (INDEPENDENT_AMBULATORY_CARE_PROVIDER_SITE_OTHER): Payer: BC Managed Care – PPO

## 2020-09-06 ENCOUNTER — Other Ambulatory Visit: Payer: Self-pay

## 2020-09-06 DIAGNOSIS — Z5181 Encounter for therapeutic drug level monitoring: Secondary | ICD-10-CM | POA: Diagnosis not present

## 2020-09-06 DIAGNOSIS — I240 Acute coronary thrombosis not resulting in myocardial infarction: Secondary | ICD-10-CM

## 2020-09-06 LAB — POCT INR: INR: 1.3 — AB (ref 2.0–3.0)

## 2020-09-06 NOTE — Patient Instructions (Signed)
Take 2 tablets today only Continue taking warfarin 1 tablet daily.  Recheck INR in 3 weeks. Coumadin Clinic: [phone]

## 2020-09-08 ENCOUNTER — Ambulatory Visit: Payer: Self-pay | Admitting: Family Medicine

## 2020-09-12 DIAGNOSIS — J029 Acute pharyngitis, unspecified: Secondary | ICD-10-CM | POA: Diagnosis not present

## 2020-09-12 DIAGNOSIS — Z20828 Contact with and (suspected) exposure to other viral communicable diseases: Secondary | ICD-10-CM | POA: Diagnosis not present

## 2020-09-22 ENCOUNTER — Encounter: Payer: Self-pay | Admitting: Adult Health

## 2020-09-22 ENCOUNTER — Ambulatory Visit (INDEPENDENT_AMBULATORY_CARE_PROVIDER_SITE_OTHER): Payer: BC Managed Care – PPO | Admitting: Adult Health

## 2020-09-22 VITALS — BP 117/76 | HR 84 | Ht 70.0 in | Wt 276.0 lb

## 2020-09-22 DIAGNOSIS — I63139 Cerebral infarction due to embolism of unspecified carotid artery: Secondary | ICD-10-CM | POA: Diagnosis not present

## 2020-09-22 NOTE — Patient Instructions (Addendum)
Continue warfarin daily  and atorvastatin 10mg  daily  for secondary stroke prevention  Continue to follow with cardiology and coumadin clinic for routine monitoring  Continue to follow up with PCP regarding cholelsterol and blood pressure management  Maintain strict control of hypertension with blood pressure goal below 130/90, diabetes with hemoglobin A1c goal below 6.5% and cholesterol with LDL cholesterol (bad cholesterol) goal below 70 mg/dL.     Followup in the future with me in 6 months or call earlier if needed     Thank you for coming to see at [facility]. I hope we have been able to provide you high quality care today.  You may receive a patient satisfaction survey over the next few weeks. We would appreciate your feedback and comments so that we may continue to improve ourselves and the health of our patients.

## 2020-09-22 NOTE — Progress Notes (Signed)
I agree with the above plan 

## 2020-09-22 NOTE — Progress Notes (Signed)
[facility] [address] [place]. [place] [address] [phone]       OFFICE FOLLOW-UP NOTE  [name] Date of Birth:  08/06/1995 Medical Record Number:  [id number]    Reason for visit: stroke f/u   Chief Complaint  Patient presents with  . Follow-up    RM 14 with grandmother (linda)  PT is well, no complaints       HPI:   Today, 09/22/2020, Joel [name] returns for 30-month stroke follow-up accompanied by his grandmother  Stable since prior visit without new stroke/TIA symptoms Denies residual deficits  Compliant on warfarin without associated side effects INR levels monitored by Coumadin clinic-recent INR 1.3 stable months prior and plans on repeating 3/29  Blood pressure today 117/76 Remains on atorvastatin 10 mg daily with recent LDL 88  Routinely followed by cardiology with recent 2D echo showing EF 35% and resolution of LV thrombus  No concerns at this time    History provided for reference purposes only Initial visit 05/31/2020 Dr. Pearlean Brownie: [name] is a 25 year old male seen today for initial office follow-up visit following hospital consultation for stroke in October 2021.  Patient has past medical history of hypertension, anxiety, COVID-19 infection in June 2021 followed by myocarditis with left ventricular apical thrombus.  He presented to [facility] with shortness of breath and hemoptysis for a month and echocardiogram there showed ejection fraction less than 20% with left ventricular apical thrombus and CT scan showing multifocal pneumonia.  He was started on IV heparin and Lasix and transferred to [facility] on 04/24/2020.  He developed sudden onset of right flaccid hemiplegia on 04/25/2020 with NIH stroke scale of 7.  He was not a candidate for TPA as he had received low molecular weight heparin within 24 hours.  CT angiogram of the head and neck both showed no significant large vessel stenosis or  occlusion.  MRI scan shows multiple bifrontal parietal left MCA and bilateral ACA territory embolic infarcts.  LDL cholesterol is 85 mg percent.  Hemoglobin A1c was 5.8.  Cardiac MRI was obtained which showed ejection fraction less than 15% with apical clot.  He appears to be at risk for obstructive sleep apnea and was screened for the sleep smart stroke prevention study but did not meet criteria.  Patient was initially started on IV heparin and bridged to warfarin.  He states he has done well since discharge.  His right-sided weakness has improved.  He is living at home with his grandmother.  Tolerating warfarin well without bruising or bleeding and last INR on 05/24/2020 was optimal at 2.4.  His blood pressure is usually well controlled today it is elevated at 169/18 office.  He has followed up in the cardiology clinic.  He states he occasionally gets intermittent paresthesias on his face but his strength overall has improved.  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  He has no new complaints today.     ROS:   14 system review of systems is positive for those listed in HPI and all other systems negative  PMH:  Past Medical History:  Diagnosis Date  . Anxiety   . Cerebral embolism with cerebral infarction 04/28/2020  . Chronic systolic CHF 04/24/2020   Echo at [facility] 04/2020: EF < 20, mild to mod MR, LV thrombus  . Depression   . Dilated cardiomyopathy (HCC)   . History of COVID-19   . Hypertension   .  LV (left ventricular) mural thrombus     Social History:  Social History   Socioeconomic History  . Marital status: Single    Spouse name: Not on file  . Number of children: Not on file  . Years of education: Not on file  . Highest education level: Not on file  Occupational History  . Occupation: Full time/Plumbing  Tobacco Use  . Smoking status: Former Smoker    Types: E-cigarettes  . Smokeless tobacco: Current User    Types: Chew  . Tobacco comment:  quit around June 2021  Vaping Use  . Vaping Use: Former  . Substances: Nicotine  Substance and Sexual Activity  . Alcohol use: Not Currently    Comment: Patient used to drink occasionally a beer, but stopped when started lexapro  . Drug use: Never  . Sexual activity: Yes    Birth control/protection: Condom  Other Topics Concern  . Not on file  Social History Narrative   Lives with grandmother, grandfather and sister   Right Handed   Drinks rarely caffeine   Social Determinants of Health   Financial Resource Strain: Not on file  Food Insecurity: Not on file  Transportation Needs: Not on file  Physical Activity: Not on file  Stress: Not on file  Social Connections: Not on file  Intimate Partner Violence: Not on file    Medications:   Current Outpatient Medications on File Prior to Visit  Medication Sig Dispense Refill  . atorvastatin (LIPITOR) 10 MG tablet Take 1 tablet (10 mg total) by mouth daily. 30 tablet 6  . carvedilol (COREG) 25 MG tablet Take 1 tablet (25 mg total) by mouth 2 (two) times daily with a meal. 180 tablet 3  . dapagliflozin propanediol (FARXIGA) 10 MG TABS tablet Take 1 tablet (10 mg total) by mouth daily. 30 tablet 6  . digoxin (LANOXIN) 0.125 MG tablet Take 1 tablet (0.125 mg total) by mouth daily. 30 tablet 6  . fexofenadine (ALLEGRA) 60 MG tablet Take 60 mg by mouth daily as needed for allergies or rhinitis.    Marland Kitchen montelukast (SINGULAIR) 10 MG tablet Take 10 mg by mouth at bedtime.    . sacubitril-valsartan (ENTRESTO) 97-103 MG Take 1 tablet by mouth 2 (two) times daily. Needs appointment for further refill. 60 tablet 1  . spironolactone (ALDACTONE) 25 MG tablet Take 1 tablet (25 mg total) by mouth daily. 30 tablet 6  . warfarin (COUMADIN) 5 MG tablet Take 1 tablet (5 mg total) by mouth daily. Or as directed 30 tablet 2   No current facility-administered medications on file prior to visit.    Allergies:  No Known Allergies  Physical Exam Today's  Vitals   09/22/20 1337  BP: 117/76  Pulse: 84  Weight: 276 lb (125.2 kg)  Height: 5\' 10"  (1.778 m)   Body mass index is 39.6 kg/m.   General: Obese pleasant young male, seated, in no evident distress Head: head normocephalic and atraumatic.  Neck: supple with no carotid or supraclavicular bruits Cardiovascular: regular rate and rhythm, no murmurs Musculoskeletal: no deformity Skin:  no rash/petichiae Vascular:  Normal pulses all extremities  Neurologic Exam Mental Status: Awake and fully alert.  Fluent speech and language.  Oriented to place and time. Recent and remote memory intact. Attention span, concentration and fund of knowledge appropriate. Mood and affect appropriate.  Cranial Nerves: Pupils equal, briskly reactive to light. Extraocular movements full without nystagmus. Visual fields full to confrontation. Hearing intact. Facial sensation intact. Face, tongue,  palate moves normally and symmetrically.  Motor: Normal bulk and tone. Normal strength in all tested extremity muscles. Sensory.: intact to touch ,pinprick .position and vibratory sensation.  Coordination: Rapid alternating movements normal in all extremities. Finger-to-nose and heel-to-shin performed accurately bilaterally. Gait and Station: Arises from chair without difficulty. Stance is normal. Gait demonstrates normal stride length and balance . Able to heel, toe and tandem walk without difficulty Reflexes: 1+ and symmetric. Toes downgoing.       ASSESSMENT/PLAN: 25 year old Caucasian male with past cerebral embolic infarcts in October 2021 secondary to cardiogenic embolism from left ventricular apical clot from Covid related myocarditis and cardiomyopathy (EF 15%).      Multifocal bilateral infarcts, embolic -No residual deficits -Continue warfarin daily and atorvastatin 10 mg daily for secondary stroke prevention  -Discussed secondary stroke prevention measures and importance of close PCP follow-up for  aggressive stroke risk factor management including HTN with BP goal<130/90 and HLD with LDL goal<70  CHF -Likely in setting of viral myocarditis due to COVID-19 -Followed by cardiology Dr. [name] -Recent echo EF of 35% -plans on repeating echo around 03/2021 -Continue current treatment regimen per cardiology -per Dr. Pearlean Brownie, recommend switching warfarin to aspirin once improvement of EF and resolution of LV clot -advised patient that completion of warfarin will be determined by cardiology   Follow-up in 6 months or call earlier if needed     CC:  [facility] provider: Dr. Damaris Schooner, Kirsten, MD   I spent 30 minutes of face-to-face and non-face-to-face time with patient and grandmother.  This included previsit chart review, lab review, study review, order entry, electronic health record documentation, patient and grandmother education and discussion regarding history of prior stroke, importance of managing stroke risk factors CHF with recent echo and ongoing use of warfarin, importance of routine follow-up with PCP and cardiology and answered all other questions to patient and grandmother's satisfaction  Ihor Kelyn, AGNP-BC  [facility] [address] [place], [place] [address]  Phone [phone] Fax [phone] Note: This document was prepared with digital dictation and possible smart phrase technology. Any transcriptional errors that result from this process are unintentional.

## 2020-09-27 ENCOUNTER — Other Ambulatory Visit: Payer: Self-pay

## 2020-09-27 ENCOUNTER — Ambulatory Visit (INDEPENDENT_AMBULATORY_CARE_PROVIDER_SITE_OTHER): Payer: BC Managed Care – PPO

## 2020-09-27 DIAGNOSIS — I240 Acute coronary thrombosis not resulting in myocardial infarction: Secondary | ICD-10-CM

## 2020-09-27 DIAGNOSIS — Z5181 Encounter for therapeutic drug level monitoring: Secondary | ICD-10-CM

## 2020-09-27 LAB — POCT INR: INR: 1.5 — AB (ref 2.0–3.0)

## 2020-09-27 NOTE — Patient Instructions (Signed)
Take 2 tablets today only and then increase warfarin 1 tablet daily except 1.5 tablets on Monday and Friday.  Recheck INR in 4 weeks. Coumadin Clinic: [phone]

## 2020-10-03 NOTE — Progress Notes (Signed)
Canceled.

## 2020-10-04 ENCOUNTER — Ambulatory Visit (INDEPENDENT_AMBULATORY_CARE_PROVIDER_SITE_OTHER): Payer: BC Managed Care – PPO | Admitting: Family Medicine

## 2020-10-04 DIAGNOSIS — I420 Dilated cardiomyopathy: Secondary | ICD-10-CM

## 2020-10-05 ENCOUNTER — Other Ambulatory Visit: Payer: Self-pay | Admitting: Cardiology

## 2020-10-05 ENCOUNTER — Encounter: Payer: Self-pay | Admitting: Nurse Practitioner

## 2020-10-05 ENCOUNTER — Other Ambulatory Visit: Payer: Self-pay

## 2020-10-05 ENCOUNTER — Ambulatory Visit (INDEPENDENT_AMBULATORY_CARE_PROVIDER_SITE_OTHER): Payer: BC Managed Care – PPO | Admitting: Nurse Practitioner

## 2020-10-05 VITALS — BP 110/72 | HR 78 | Temp 97.5°F | Ht 70.0 in | Wt 270.0 lb

## 2020-10-05 DIAGNOSIS — I420 Dilated cardiomyopathy: Secondary | ICD-10-CM

## 2020-10-05 DIAGNOSIS — R7301 Impaired fasting glucose: Secondary | ICD-10-CM

## 2020-10-05 DIAGNOSIS — U099 Post covid-19 condition, unspecified: Secondary | ICD-10-CM

## 2020-10-05 DIAGNOSIS — K2100 Gastro-esophageal reflux disease with esophagitis, without bleeding: Secondary | ICD-10-CM | POA: Diagnosis not present

## 2020-10-05 DIAGNOSIS — J302 Other seasonal allergic rhinitis: Secondary | ICD-10-CM

## 2020-10-05 NOTE — Progress Notes (Addendum)
New Patient Office Visit  Subjective:  Patient ID: Joel [name], male    DOB: June 25, 1996  Age: 25 y.o. MRN: [id number]  CC:  Chief Complaint  Patient presents with  . Hypertension  . Chronic Systolic CHF  . Anxiety    HPI [name] is a 25 year old male that presents to establish care with a new primary care provider. 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 He suffered a CVA on 04/25/20 while hospitalized at [facility] with left hemiparesis which has resolved. EF was found to be less than 15 % from non-ischemic cardiomyopathy.  He is followed by Dr [name], cardiology. Was last seen on 08/30/20. EF had improved to 35% per most recent ECHO. Joel [name] is also followed by neurology, Dr Delia Heady.    Joel [name] lost his mother when he was a toddler to a traumatic brain injury from a horse riding accident. He tells me his father became an alcoholic some years later, suffered a stroke and died related to health complications. He tells me  family friends of his mother took him in after he lost his father. He calls them his grandparents and resides with them currently. He works full-time as a Nutritional therapist. He is an avid outdoorsman that enjoys hunting. States he is looking forward to [place] season coming up soon.    Joel [name] tells me that he has environmental allergies. He is hoarse today when speaking. Current treatment includes Allegra 60 mg and Singulair 10 mg QHS. He is having current nasal congestion, post-nasal-drip, and bilateral ear fullness. He states that he is reluctant to take antibiotics for concerns of drug interactions with heart medications and Coumadin. He tells me he was unaware that drink packets he was using in bottled water had Vit K that were interfering with Coumadin recently.   Joel [name] states he has  trouble concentrating since contracting COVID-18 January 2020. States it interferes with work because he often starts several tasks but has difficulty completing them. He states when his friends are talking to him to often begins losing concentration. He struggles to focus at times. The ASRS screening for adult ADHD in office was positive today.  Unfortunately, he is ineligible for pharmacotherapy due to cardiomyopathy.   Joel [name] states he has experienced GERD symptoms of epigastric burning and regurgitation of foods. Takes OTC remedies as needed.  Joel [name] has a history of anxiety. He states he was anxious in the past due to dyspnea related to undiagnosed cardiomyopathy and pneumonia. GAD-7 score today in office is 4. He is not currently taking anxiolytics.   Past Medical History:  Diagnosis Date  . Anxiety   . Cerebral embolism with cerebral infarction 04/28/2020  . Chronic systolic CHF 04/24/2020   Echo at [facility] 04/2020: EF < 20, mild to mod MR, LV thrombus  . Depression   . Dilated cardiomyopathy (HCC)   . History of COVID-19   . Hypertension   . LV (left ventricular) mural thrombus     Past Surgical History:  Procedure Laterality Date  . TONSILLECTOMY      Family History  Problem Relation Age of Onset  . Diabetes Mother   . Diabetes Paternal Grandfather   . Heart failure Neg Hx     Social History   Socioeconomic History  . Marital status: Single    Spouse name: Not on  file  . Number of children: Not on file  . Years of education: Not on file  . Highest education level: Not on file  Occupational History  . Occupation: Full time/Plumbing  Tobacco Use  . Smoking status: Former Smoker    Types: E-cigarettes  . Smokeless tobacco: Current User    Types: Chew  . Tobacco comment: quit around June 2021  Vaping Use  . Vaping Use: Former  . Substances: Nicotine  Substance and Sexual Activity  . Alcohol use: Not Currently    Comment: Patient used to drink occasionally a  beer, but stopped when started lexapro  . Drug use: Never  . Sexual activity: Yes    Birth control/protection: Condom  Other Topics Concern  . Not on file  Social History Narrative   Lives with grandmother, grandfather and sister   Right Handed   Drinks rarely caffeine      ROS Review of Systems  Constitutional: Positive for fatigue. Negative for appetite change and fever.  HENT: Positive for ear pain (bilateral fullness), postnasal drip, rhinorrhea, sinus pressure, sinus pain and voice change (hoarseness). Negative for sore throat.   Eyes: Negative for itching.  Respiratory: Negative for cough, chest tightness, shortness of breath and wheezing.   Cardiovascular: Negative for chest pain, palpitations and leg swelling.  Gastrointestinal: Negative for abdominal pain, constipation, diarrhea, nausea and vomiting.       GERD  Endocrine: Negative for cold intolerance, heat intolerance, polydipsia, polyphagia and polyuria.  Genitourinary: Negative for dysuria.  Musculoskeletal: Negative for arthralgias, gait problem and joint swelling.  Skin: Negative.   Neurological: Negative for dizziness and headaches.  Psychiatric/Behavioral: Positive for decreased concentration. Negative for sleep disturbance. The patient is not nervous/anxious.     Objective:   Today's Vitals: BP 110/72 (BP Location: Left Arm, Patient Position: Sitting)   Pulse 78   Temp (!) 97.5 F (36.4 C) (Temporal)   Ht 5\' 10"  (1.778 m)   Wt 270 lb (122.5 kg)   SpO2 96%   BMI 38.74 kg/m   Physical Exam Vitals reviewed.  Constitutional:      Appearance: Normal appearance.  HENT:     Head: Normocephalic.     Right Ear: Tympanic membrane normal.     Left Ear: Tympanic membrane normal.     Nose: Congestion and rhinorrhea present.     Mouth/Throat:     Mouth: Mucous membranes are moist.  Eyes:     Pupils: Pupils are equal, round, and reactive to light.  Cardiovascular:     Rate and Rhythm: Normal rate and  regular rhythm.     Pulses: Normal pulses.     Heart sounds: Normal heart sounds.  Pulmonary:     Effort: Pulmonary effort is normal.     Breath sounds: Normal breath sounds.  Abdominal:     General: Bowel sounds are normal.     Palpations: Abdomen is soft.  Musculoskeletal:        General: Normal range of motion.     Cervical back: Neck supple.  Skin:    General: Skin is warm and dry.     Capillary Refill: Capillary refill takes less than 2 seconds.  Neurological:     General: No focal deficit present.     Mental Status: He is alert and oriented to person, place, and time.  Psychiatric:        Mood and Affect: Mood normal.        Behavior: Behavior normal.  Assessment & Plan:    1. Gastroesophageal reflux disease with esophagitis without hemorrhage -Avoid food triggers  2. Seasonal allergies-moderately controlled -Continue Allegra -Continue Singulair  3. Dilated cardiomyopathy (HCC)-improved - CBC With Diff/Platelet - Comprehensive metabolic panel - TSH -Continue follow-up with cardiology as scheduled -Continue all cardiac medications as prescribed  4. Impaired fasting glucose - Hemoglobin A1c  5. Post-COVID syndrome -Rest -Maintain adequate nutrition and hydration  Outpatient Encounter Medications as of 10/05/2020  Medication Sig  . atorvastatin (LIPITOR) 10 MG tablet Take 1 tablet (10 mg total) by mouth daily.  . carvedilol (COREG) 25 MG tablet Take 1 tablet (25 mg total) by mouth 2 (two) times daily with a meal.  . dapagliflozin propanediol (FARXIGA) 10 MG TABS tablet TAKE 1 TABLET (10 MG TOTAL) BY MOUTH DAILY.  . digoxin (LANOXIN) 0.125 MG tablet Take 1 tablet (0.125 mg total) by mouth daily.  . fexofenadine (ALLEGRA) 60 MG tablet Take 60 mg by mouth daily as needed for allergies or rhinitis.  . hydrOXYzine (ATARAX/VISTARIL) 50 MG tablet Take 100 mg by mouth at bedtime as needed for anxiety.  . montelukast (SINGULAIR) 10 MG tablet Take 10 mg by mouth at  bedtime.  . sacubitril-valsartan (ENTRESTO) 97-103 MG Take 1 tablet by mouth 2 (two) times daily. Needs appointment for further refill.  . spironolactone (ALDACTONE) 25 MG tablet Take 1 tablet (25 mg total) by mouth daily.  Marland Kitchen warfarin (COUMADIN) 5 MG tablet Take 1 tablet (5 mg total) by mouth daily. Or as directed   No facility-administered encounter medications on file as of 10/05/2020.    Follow-up:40-months   Janie Morning, NP

## 2020-10-05 NOTE — Patient Instructions (Signed)
Attention Deficit Hyperactivity Disorder, Adult Attention deficit hyperactivity disorder (ADHD) is a mental health disorder that starts during childhood (neurodevelopmental disorder). For many people with ADHD, the disorder continues into the adult years. Treatment can help you manage your symptoms. What are the causes? The exact cause of ADHD is not known. Most experts believe genetics and environmental factors contribute to ADHD. What increases the risk? The following factors may make you more likely to develop this condition:  Having a family history of ADHD.  Being male.  Being born to a mother who smoked or drank alcohol during pregnancy.  Being exposed to lead or other toxins in the womb or early in life.  Being born before 37 weeks of pregnancy (prematurely) or at a low birth weight.  Having experienced a brain injury. What are the signs or symptoms? Symptoms of this condition depend on the type of ADHD. The two main types are inattentive and hyperactive-impulsive. Some people may have symptoms of both types. Symptoms of the inattentive type include:  Difficulty paying attention.  Making careless mistakes.  Not following instructions.  Being disorganized.  Avoiding tasks that require time and attention.  Losing and forgetting things.  Being easily distracted. Symptoms of the hyperactive-impulsive type include:  Restlessness.  Talking too much.  Interrupting.  Difficulty with: ? Sitting still. ? Feeling motivated. ? Relaxing. ? Waiting in line or waiting for a turn. In adults, this condition may lead to certain problems, such as:  Keeping jobs.  Performing tasks at work.  Having stable relationships.  Being on time or keeping to a schedule. How is this diagnosed? This condition is diagnosed based on your current symptoms and your history of symptoms. The diagnosis can be made by a health care provider such as a primary care provider or a mental health  care specialist. Your health care provider may use a symptom checklist or a behavior rating scale to evaluate your symptoms. He or she may also want to talk with people who have observed your behaviors throughout your life. How is this treated? This condition can be treated with medicines and behavior therapy. Medicines may be the best option to reduce impulsive behaviors and improve attention. Your health care provider may recommend:  Stimulant medicines. These are the most common medicines used for adult ADHD. They affect certain chemicals in the brain (neurotransmitters) and improve your ability to control your symptoms.  A non-stimulant medicine for adult ADHD (atomoxetine). This medicine increases a neurotransmitter called norepinephrine. It may take weeks to months to see effects from this medicine. Counseling and behavioral management are also important for treating ADHD. Counseling is often used along with medicine. Your health care provider may suggest:  Cognitive behavioral therapy (CBT). This type of therapy teaches you to replace negative thoughts and actions with positive thoughts and actions. When used as part of ADHD treatment, this therapy may also include: ? Coping strategies for organization, time management, impulse control, and stress reduction. ? Mindfulness and meditation training.  Behavioral management. You may work with a coach who is specially trained to help people with ADHD manage and organize activities and function more effectively. Follow these instructions at home: Medicines  Take over-the-counter and prescription medicines only as told by your health care provider.  Talk with your health care provider about the possible side effects of your medicines and how to manage them.   Lifestyle  Do not use drugs.  Do not drink alcohol if: ? Your health care provider tells you   not to drink. ? You are pregnant, may be pregnant, or are planning to become  pregnant.  If you drink alcohol: ? Limit how much you use to:  0-1 drink a day for women.  0-2 drinks a day for men. ? Be aware of how much alcohol is in your drink. In the [place] one drink equals one 12 oz bottle of beer (355 mL), one 5 oz glass of wine (148 mL), or one 1 oz glass of hard liquor (44 mL).  Get enough sleep.  Eat a healthy diet.  Exercise regularly. Exercise can help to reduce stress and anxiety.   General instructions  Learn as much as you can about adult ADHD, and work closely with your health care providers to find the treatments that work best for you.  Follow the same schedule each day.  Use reminder devices like notes, calendars, and phone apps to stay on time and organized.  Keep all follow-up visits as told by your health care provider and therapist. This is important. Where to find more information A health care provider may be able to recommend resources that are available online or over the phone. You could start with:  Attention Deficit Disorder Association (ADDA): http://davis-dillon.net/  [employer] of Mental Health [facility]): http://www.maynard.net/ Contact a health care provider if:  Your symptoms continue to cause problems.  You have side effects from your medicine, such as: ? Repeated muscle twitches, coughing, or speech outbursts. ? Sleep problems. ? Loss of appetite. ? Dizziness. ? Unusually fast heartbeat. ? Stomach pains. ? Headaches.  You are struggling with anxiety, depression, or substance abuse. Get help right away if you:  Have a severe reaction to a medicine. If you ever feel like you may hurt yourself or others, or have thoughts about taking your own life, get help right away. You can go to the nearest emergency department or call:  Your local emergency services (911 in the [place]  A suicide crisis helpline, such as the National Suicide Prevention Lifeline at (907)031-4833. This is open 24 hours a day. Summary  ADHD is a mental  health disorder that starts during childhood (neurodevelopmental disorder) and often continues into the adult years.  The exact cause of ADHD is not known. Most experts believe genetics and environmental factors contribute to ADHD.  There is no cure for ADHD, but treatment with medicine, cognitive behavioral therapy, or behavioral management can help you manage your condition. This information is not intended to replace advice given to you by your health care provider. Make sure you discuss any questions you have with your health care provider. Document Revised: 11/10/2018 Document Reviewed: 11/10/2018 Elsevier Patient Education  2021 Elsevier Inc. Food Choices for Gastroesophageal Reflux Disease, Adult When you have gastroesophageal reflux disease (GERD), the foods you eat and your eating habits are very important. Choosing the right foods can help ease your discomfort. Think about working with a food expert (dietitian) to help you make good choices. What are tips for following this plan? Reading food labels  Look for foods that are low in saturated fat. Foods that may help with your symptoms include: ? Foods that have less than 5% of daily value (DV) of fat. ? Foods that have 0 grams of trans fat. Cooking  Do not fry your food.  Cook your food by baking, steaming, grilling, or broiling. These are all methods that do not need a lot of fat for cooking.  To add flavor, try to use herbs that are  low in spice and acidity. Meal planning  Choose healthy foods that are low in fat, such as: ? Fruits and vegetables. ? Whole grains. ? Low-fat dairy products. ? Lean meats, fish, and poultry.  Eat small meals often instead of eating 3 large meals each day. Eat your meals slowly in a place where you are relaxed. Avoid bending over or lying down until 2-3 hours after eating.  Limit high-fat foods such as fatty meats or fried foods.  Limit your intake of fatty foods, such as oils, butter, and  shortening.  Avoid the following as told by your doctor: ? Foods that cause symptoms. These may be different for different people. Keep a food diary to keep track of foods that cause symptoms. ? Alcohol. ? Drinking a lot of liquid with meals. ? Eating meals during the 2-3 hours before bed.   Lifestyle  Stay at a healthy weight. Ask your doctor what weight is healthy for you. If you need to lose weight, work with your doctor to do so safely.  Exercise for at least 30 minutes on 5 or more days each week, or as told by your doctor.  Wear loose-fitting clothes.  Do not smoke or use any products that contain nicotine or tobacco. If you need help quitting, ask your doctor.  Sleep with the head of your bed higher than your feet. Use a wedge under the mattress or blocks under the bed frame to raise the head of the bed.  Chew sugar-free gum after meals. What foods should eat? Eat a healthy, well-balanced diet of fruits, vegetables, whole grains, low-fat dairy products, lean meats, fish, and poultry. Each person is different. Foods that may cause symptoms in one person may not cause any symptoms in another person. Work with your doctor to find foods that are safe for you. The items listed above may not be a complete list of what you can eat and drink. Contact a food expert for more options.   What foods should I avoid? Limiting some of these foods may help in managing the symptoms of GERD. Everyone is different. Talk with a food expert or your doctor to help you find the exact foods to avoid, if any. Fruits Any fruits prepared with added fat. Any fruits that cause symptoms. For some people, this may include citrus fruits, such as oranges, grapefruit, pineapple, and lemons. Vegetables Deep-fried vegetables. [place] fries. Any vegetables prepared with added fat. Any vegetables that cause symptoms. For some people, this may include tomatoes and tomato products, chili peppers, onions and garlic, and  horseradish. Grains Pastries or quick breads with added fat. Meats and other proteins High-fat meats, such as fatty beef or pork, hot dogs, ribs, ham, sausage, salami, and bacon. Fried meat or protein, including fried fish and fried chicken. Nuts and nut butters, in large amounts. Dairy Whole milk and chocolate milk. Sour cream. Cream. Ice cream. Cream cheese. Milkshakes. Fats and oils Butter. Margarine. Shortening. Ghee. Beverages Coffee and tea, with or without caffeine. Carbonated beverages. Sodas. Energy drinks. Fruit juice made with acidic fruits, such as orange or grapefruit. Tomato juice. Alcoholic drinks. Sweets and desserts Chocolate and cocoa. Donuts. Seasonings and condiments Pepper. Peppermint and spearmint. Added salt. Any condiments, herbs, or seasonings that cause symptoms. For some people, this may include curry, hot sauce, or vinegar-based salad dressings. The items listed above may not be a complete list of what you should not eat and drink. Contact a food expert for more options. Questions to  ask your doctor Diet and lifestyle changes are often the first steps that are taken to manage symptoms of GERD. If diet and lifestyle changes do not help, talk with your doctor about taking medicines. Where to find more information  International Foundation for Gastrointestinal Disorders: aboutgerd.org Summary  When you have GERD, food and lifestyle choices are very important in easing your symptoms.  Eat small meals often instead of 3 large meals a day. Eat your meals slowly and in a place where you are relaxed.  Avoid bending over or lying down until 2-3 hours after eating.  Limit high-fat foods such as fatty meats or fried foods. This information is not intended to replace advice given to you by your health care provider. Make sure you discuss any questions you have with your health care provider. Document Revised: 12/28/2019 Document Reviewed: 12/28/2019 Elsevier Patient  Education  2021 [employer].

## 2020-10-06 LAB — COMPREHENSIVE METABOLIC PANEL
ALT: 25 IU/L (ref 0–44)
AST: 21 IU/L (ref 0–40)
Albumin/Globulin Ratio: 1.6 (ref 1.2–2.2)
Albumin: 4.5 g/dL (ref 4.1–5.2)
Alkaline Phosphatase: 52 IU/L (ref 44–121)
BUN/Creatinine Ratio: 15 (ref 9–20)
BUN: 18 mg/dL (ref 6–20)
Bilirubin Total: 0.7 mg/dL (ref 0.0–1.2)
CO2: 20 mmol/L (ref 20–29)
Calcium: 9.7 mg/dL (ref 8.7–10.2)
Chloride: 100 mmol/L (ref 96–106)
Creatinine, Ser: 1.19 mg/dL (ref 0.76–1.27)
Globulin, Total: 2.8 g/dL (ref 1.5–4.5)
Glucose: 93 mg/dL (ref 65–99)
Potassium: 4.4 mmol/L (ref 3.5–5.2)
Sodium: 140 mmol/L (ref 134–144)
Total Protein: 7.3 g/dL (ref 6.0–8.5)
eGFR: 89 mL/min/{1.73_m2} (ref >59)

## 2020-10-06 LAB — CBC WITH DIFF/PLATELET
Basophils Absolute: 0.1 10*3/uL (ref 0.0–0.2)
Basos: 1 %
EOS (ABSOLUTE): 0.1 10*3/uL (ref 0.0–0.4)
Eos: 1 %
Hematocrit: 46.6 % (ref 37.5–51.0)
Hemoglobin: 15.6 g/dL (ref 13.0–17.7)
Immature Grans (Abs): 0 10*3/uL (ref 0.0–0.1)
Immature Granulocytes: 0 %
Lymphocytes Absolute: 1.3 10*3/uL (ref 0.7–3.1)
Lymphs: 13 %
MCH: 30.6 pg (ref 26.6–33.0)
MCHC: 33.5 g/dL (ref 31.5–35.7)
MCV: 91 fL (ref 79–97)
Monocytes Absolute: 0.9 10*3/uL (ref 0.1–0.9)
Monocytes: 9 %
Neutrophils Absolute: 7.7 10*3/uL — ABNORMAL HIGH (ref 1.4–7.0)
Neutrophils: 76 %
Platelets: 231 10*3/uL (ref 150–450)
RBC: 5.1 x10E6/uL (ref 4.14–5.80)
RDW: 13.9 % (ref 11.6–15.4)
WBC: 10.2 10*3/uL (ref 3.4–10.8)

## 2020-10-06 LAB — TSH: TSH: 1.42 u[IU]/mL (ref 0.450–4.500)

## 2020-10-06 LAB — HEMOGLOBIN A1C
Est. average glucose Bld gHb Est-mCnc: 105 mg/dL
Hgb A1c MFr Bld: 5.3 % (ref 4.8–5.6)

## 2020-10-12 ENCOUNTER — Other Ambulatory Visit: Payer: Self-pay

## 2020-10-12 MED ORDER — HYDROXYZINE HCL 50 MG PO TABS
100.0000 mg | ORAL_TABLET | Freq: Every evening | ORAL | 0 refills | Status: DC | PRN
Start: 2020-10-12 — End: 2020-12-06

## 2020-10-25 ENCOUNTER — Other Ambulatory Visit: Payer: Self-pay

## 2020-10-25 ENCOUNTER — Ambulatory Visit (INDEPENDENT_AMBULATORY_CARE_PROVIDER_SITE_OTHER): Payer: BC Managed Care – PPO

## 2020-10-25 DIAGNOSIS — Z5181 Encounter for therapeutic drug level monitoring: Secondary | ICD-10-CM

## 2020-10-25 DIAGNOSIS — I240 Acute coronary thrombosis not resulting in myocardial infarction: Secondary | ICD-10-CM

## 2020-10-25 LAB — POCT INR: INR: 1.7 — AB (ref 2.0–3.0)

## 2020-10-25 NOTE — Patient Instructions (Signed)
Take 1 extra tablet today only and then increase warfarin 1.5 tablet daily except 1 tablet on Sunday, Wednesday and Saturday.  Recheck INR in 4 weeks. [facility]: [phone]

## 2020-11-02 ENCOUNTER — Other Ambulatory Visit: Payer: Self-pay | Admitting: Cardiology

## 2020-11-30 ENCOUNTER — Other Ambulatory Visit (HOSPITAL_COMMUNITY): Payer: BC Managed Care – PPO

## 2020-12-06 ENCOUNTER — Other Ambulatory Visit: Payer: Self-pay

## 2020-12-06 MED ORDER — HYDROXYZINE HCL 50 MG PO TABS: 100.0000 mg | ORAL_TABLET | Freq: Every evening | ORAL | 0 refills | Status: DC | PRN

## 2020-12-09 ENCOUNTER — Other Ambulatory Visit (HOSPITAL_COMMUNITY): Payer: BC Managed Care – PPO

## 2020-12-19 ENCOUNTER — Telehealth: Payer: Self-pay

## 2020-12-19 NOTE — Telephone Encounter (Signed)
LMOM FOR OVERDUE INR 

## 2020-12-27 ENCOUNTER — Other Ambulatory Visit: Payer: Self-pay

## 2020-12-27 ENCOUNTER — Ambulatory Visit (INDEPENDENT_AMBULATORY_CARE_PROVIDER_SITE_OTHER): Payer: BC Managed Care – PPO | Admitting: Pharmacist

## 2020-12-27 DIAGNOSIS — Z5181 Encounter for therapeutic drug level monitoring: Secondary | ICD-10-CM | POA: Diagnosis not present

## 2020-12-27 DIAGNOSIS — I6340 Cerebral infarction due to embolism of unspecified cerebral artery: Secondary | ICD-10-CM

## 2020-12-27 DIAGNOSIS — I240 Acute coronary thrombosis not resulting in myocardial infarction: Secondary | ICD-10-CM | POA: Diagnosis not present

## 2020-12-27 LAB — POCT INR: INR: 2.7 (ref 2.0–3.0)

## 2020-12-27 NOTE — Patient Instructions (Addendum)
Description   Continue warfarin 1.5 tablet daily except 1 tablet on Sunday, Wednesday and Saturday.  Recheck INR in 4 weeks. [facility]: [phone]

## 2021-01-05 ENCOUNTER — Ambulatory Visit: Payer: BC Managed Care – PPO | Admitting: Nurse Practitioner

## 2021-01-05 ENCOUNTER — Ambulatory Visit: Payer: BC Managed Care – PPO | Admitting: Family Medicine

## 2021-01-17 ENCOUNTER — Other Ambulatory Visit (HOSPITAL_COMMUNITY): Payer: BC Managed Care – PPO

## 2021-01-21 DIAGNOSIS — Z1339 Encounter for screening examination for other mental health and behavioral disorders: Secondary | ICD-10-CM | POA: Diagnosis not present

## 2021-01-21 DIAGNOSIS — F17210 Nicotine dependence, cigarettes, uncomplicated: Secondary | ICD-10-CM | POA: Diagnosis not present

## 2021-01-21 DIAGNOSIS — F419 Anxiety disorder, unspecified: Secondary | ICD-10-CM | POA: Diagnosis not present

## 2021-01-21 DIAGNOSIS — I509 Heart failure, unspecified: Secondary | ICD-10-CM | POA: Diagnosis not present

## 2021-01-21 DIAGNOSIS — Z7901 Long term (current) use of anticoagulants: Secondary | ICD-10-CM | POA: Diagnosis not present

## 2021-01-21 DIAGNOSIS — Z79899 Other long term (current) drug therapy: Secondary | ICD-10-CM | POA: Diagnosis not present

## 2021-01-21 DIAGNOSIS — F1910 Other psychoactive substance abuse, uncomplicated: Secondary | ICD-10-CM | POA: Diagnosis not present

## 2021-01-21 DIAGNOSIS — I110 Hypertensive heart disease with heart failure: Secondary | ICD-10-CM | POA: Diagnosis not present

## 2021-01-24 ENCOUNTER — Other Ambulatory Visit (HOSPITAL_COMMUNITY): Payer: BC Managed Care – PPO

## 2021-02-01 ENCOUNTER — Telehealth: Payer: Self-pay

## 2021-02-01 NOTE — Telephone Encounter (Signed)
Called and spoke w/pt's grandmother who stated that they do not live w/them anymore and they have no way of getting his number

## 2021-02-07 ENCOUNTER — Telehealth: Payer: Self-pay

## 2021-02-07 NOTE — Telephone Encounter (Signed)
LMOM FOR OVERDUE INR 

## 2021-02-14 DIAGNOSIS — F1511 Other stimulant abuse, in remission: Secondary | ICD-10-CM | POA: Diagnosis not present

## 2021-02-14 DIAGNOSIS — F1520 Other stimulant dependence, uncomplicated: Secondary | ICD-10-CM | POA: Diagnosis not present

## 2021-02-28 ENCOUNTER — Ambulatory Visit (INDEPENDENT_AMBULATORY_CARE_PROVIDER_SITE_OTHER): Payer: BC Managed Care – PPO | Admitting: Family Medicine

## 2021-02-28 ENCOUNTER — Other Ambulatory Visit: Payer: Self-pay

## 2021-02-28 ENCOUNTER — Encounter: Payer: Self-pay | Admitting: Family Medicine

## 2021-02-28 VITALS — BP 110/78 | HR 78 | Temp 97.8°F | Resp 18 | Ht 70.0 in | Wt 284.0 lb

## 2021-02-28 DIAGNOSIS — Z5181 Encounter for therapeutic drug level monitoring: Secondary | ICD-10-CM | POA: Diagnosis not present

## 2021-02-28 DIAGNOSIS — I5042 Chronic combined systolic (congestive) and diastolic (congestive) heart failure: Secondary | ICD-10-CM | POA: Diagnosis not present

## 2021-02-28 DIAGNOSIS — Z7901 Long term (current) use of anticoagulants: Secondary | ICD-10-CM | POA: Diagnosis not present

## 2021-02-28 DIAGNOSIS — F411 Generalized anxiety disorder: Secondary | ICD-10-CM | POA: Diagnosis not present

## 2021-02-28 DIAGNOSIS — I420 Dilated cardiomyopathy: Secondary | ICD-10-CM | POA: Diagnosis not present

## 2021-02-28 DIAGNOSIS — F1920 Other psychoactive substance dependence, uncomplicated: Secondary | ICD-10-CM

## 2021-02-28 DIAGNOSIS — E782 Mixed hyperlipidemia: Secondary | ICD-10-CM

## 2021-02-28 NOTE — Progress Notes (Signed)
Subjective:  Patient ID: Joel [name], male    DOB: [birth year]  Age: 25 y.o. MRN: [id number]  Chief Complaint  Patient presents with   Depression    HPI Post Covid 57 Cardiomyopathy with CHF: Patient is currently on carvedilol 25 mg twice daily, Farxiga 10 mg, digoxin 1.25 mcg daily, Entresto 97/103 mg twice daily and spironolactone 25 mg daily.  Patient currently is on Coumadin 5 mg 1-1/2 tablets Monday Wednesday and Friday and 1 tablet all other days of the week.  Patient has not been to have his INR checked in approximately 7 weeks with cardiology. The patient reports he has been in outpatient rehab for 36 days for drug use and alcohol use.  Pt reports started using February 2022.  Drug abuse: Pt says he is doing well and has not used since getting out of rehab.  His grandparents who he had previously lived with made him go to his girlfriend's house for one month while in outpatient rehab.  He has now returned to his grandparents home.  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  He reports his girlfriend does not use drugs.  He does admit to still using THC.  He reports he is still going to counseling.   Generalized anxiety has worsened.  His GAD-7 score went from 7-11.  At his last visit with Flonnie Hailstone, NP, she was concerned he had ADHD.  Patient does report he is continuing to have issues with ability to pay attention.     Current Outpatient Medications on File Prior to Visit  Medication Sig Dispense Refill   atorvastatin (LIPITOR) 10 MG tablet TAKE ONE TABLET BY MOUTH EVERY DAY 30 tablet 5   carvedilol (COREG) 25 MG tablet Take 1 tablet (25 mg total) by mouth 2 (two) times daily with a meal. 180 tablet 3   dapagliflozin propanediol (FARXIGA) 10 MG TABS tablet TAKE 1 TABLET (10 MG TOTAL) BY MOUTH DAILY. 30 tablet 6   digoxin (LANOXIN) 0.125 MG tablet TAKE ONE TABLET BY MOUTH EVERY DAY 30 tablet 5   ENTRESTO 97-103 MG TAKE ONE TABLET BY MOUTH TWICE  DAILY 60 tablet 11   fexofenadine (ALLEGRA) 60 MG tablet Take 60 mg by mouth daily as needed for allergies or rhinitis.     hydrOXYzine (ATARAX/VISTARIL) 50 MG tablet Take 2 tablets (100 mg total) by mouth at bedtime as needed for anxiety. 120 tablet 0   montelukast (SINGULAIR) 10 MG tablet Take 10 mg by mouth at bedtime.     spironolactone (ALDACTONE) 25 MG tablet TAKE ONE TABLET BY MOUTH EVERY DAY 30 tablet 5   warfarin (COUMADIN) 5 MG tablet TAKE ONE TABLET BY MOUTH EVERY DAY or as directed (Patient taking differently: Take 5 mg by mouth daily. 1 1/2 tabs. Mon. Wed and Friday 1 tablet all other day) 30 tablet 2   No current facility-administered medications on file prior to visit.   Past Medical History:  Diagnosis Date   Anxiety    Cerebral embolism with cerebral infarction 04/28/2020   Chronic systolic CHF 04/24/2020   Echo at [facility] 04/2020: EF < 20, mild to mod MR, LV thrombus   Depression    Dilated cardiomyopathy (HCC)    History of COVID-19    Hypertension    LV (left ventricular) mural thrombus    Past Surgical History:  Procedure Laterality Date   TONSILLECTOMY      Family History  Problem Relation Age of  Onset   Diabetes Mother    Diabetes Paternal Grandfather    Heart failure Neg Hx    Social History   Socioeconomic History   Marital status: Single    Spouse name: Not on file   Number of children: Not on file   Years of education: Not on file   Highest education level: Not on file  Occupational History   Occupation: Full time/Plumbing  Tobacco Use   Smoking status: Former    Types: E-cigarettes   Smokeless tobacco: Current    Types: Chew   Tobacco comments:    quit around June 2021  Vaping Use   Vaping Use: Former   Substances: Nicotine  Substance and Sexual Activity   Alcohol use: Not Currently    Comment: Patient used to drink occasionally a beer, but stopped when started lexapro   Drug use: Never   Sexual activity: Yes    Birth  control/protection: Condom  Other Topics Concern   Not on file  Social History Narrative   Lives with grandmother, grandfather and sister   Right Handed   Drinks rarely caffeine   Social Determinants of Health   Financial Resource Strain: Not on file  Food Insecurity: Not on file  Transportation Needs: Not on file  Physical Activity: Not on file  Stress: Not on file  Social Connections: Not on file    Review of Systems  Constitutional:  Negative for chills and fever.  HENT:  Negative for congestion, ear pain, rhinorrhea and sore throat.   Respiratory:  Positive for shortness of breath (with significant exertion. Greatly improved as EF improves. No longer having orthopnea/PND.). Negative for cough.   Cardiovascular:  Negative for chest pain and palpitations.  Gastrointestinal:  Negative for abdominal pain, constipation, diarrhea, nausea and vomiting.  Genitourinary:  Negative for dysuria and urgency.  Musculoskeletal:  Negative for arthralgias, back pain and myalgias.  Neurological:  Positive for headaches. Negative for dizziness.  Psychiatric/Behavioral:  Positive for decreased concentration and sleep disturbance (trouble falling asleep). Negative for dysphoric mood. The patient is nervous/anxious.     Objective:  BP 110/78   Pulse 78   Temp 97.8 F (36.6 C)   Resp 18   Ht 5\' 10"  (1.778 m)   Wt 284 lb (128.8 kg)   BMI 40.75 kg/m   BP/Weight 02/28/2021 10/05/2020 09/22/2020  Systolic BP 110 110 117  Diastolic BP 78 72 76  Wt. (Lbs) 284 270 276  BMI 40.75 38.74 39.6    Physical Exam Vitals reviewed.  Constitutional:      Appearance: Normal appearance. He is obese.  Neck:     Vascular: No carotid bruit.  Cardiovascular:     Rate and Rhythm: Normal rate and regular rhythm.     Pulses: Normal pulses.     Heart sounds: Normal heart sounds.  Pulmonary:     Effort: Pulmonary effort is normal.     Breath sounds: Normal breath sounds. No wheezing, rhonchi or rales.   Abdominal:     General: Bowel sounds are normal.     Palpations: Abdomen is soft.     Tenderness: There is no abdominal tenderness.  Neurological:     Mental Status: He is alert.  Psychiatric:        Mood and Affect: Mood normal.        Behavior: Behavior normal.    Diabetic Foot Exam - Simple   No data filed      Lab Results  Component Value Date  WBC 10.2 10/05/2020   HGB 15.6 10/05/2020   HCT 46.6 10/05/2020   PLT 231 10/05/2020   GLUCOSE 93 10/05/2020   CHOL 153 06/29/2020   TRIG 74 06/29/2020   HDL 50 06/29/2020   LDLCALC 88 06/29/2020   ALT 25 10/05/2020   AST 21 10/05/2020   NA 140 10/05/2020   K 4.4 10/05/2020   CL 100 10/05/2020   CREATININE 1.19 10/05/2020   BUN 18 10/05/2020   CO2 20 10/05/2020   TSH 1.420 10/05/2020   INR 1.1 02/28/2021   HGBA1C 5.3 10/05/2020      Assessment & Plan:   1. Dilated cardiomyopathy (HCC) 2. Chronic combined systolic and diastolic congestive heart failure (HCC) The current medical regimen is effective and EF has been improving;  continue present plan and medications. Keep 6 month follow up with cardiology.   3. GAD (generalized anxiety disorder) Recommend continued counseling.  Patient may have some hyperactivity and attention issues however based on his history of drug abuse I would not recommend any controlled substances, in addition some of his behavior may have been related to the drugs versus true ADHD.    4. Encounter for monitoring Coumadin therapy - Protime-INR: I will send results to cardiology.  5. Substance addiction (HCC) Strongly encouraged patient to discontinue use of THC.  I did encourage him to remain clean from heroin and all other illegal drugs.  Also recommend avoidance of alcohol.  6. Mixed hyperlipidemia  Patient to return in 3 months fasting for physical.  For now continue on Lipitor as this was prescribed over the last year by cardiology.  7.  Morbid obesity, BMI 40, serious  comorbidities. Recommend work on eating healthy and exercise.  Orders Placed This Encounter  Procedures   Protime-INR     Follow-up: Return in about 3 months (around 05/31/2021) for fasting. CPE with me. .  An After Visit Summary was printed and given to the patient.  Blane Ohara, MD [facility] [phone]

## 2021-03-01 LAB — PROTIME-INR
INR: 1.1 (ref 0.9–1.2)
Prothrombin Time: 11.4 s (ref 9.1–12.0)

## 2021-03-06 ENCOUNTER — Encounter: Payer: Self-pay | Admitting: Family Medicine

## 2021-03-07 ENCOUNTER — Telehealth: Payer: Self-pay

## 2021-03-07 NOTE — Telephone Encounter (Signed)
-----   Message from Cheree Ditto, [facility] sent at 03/07/2021  4:27 PM EDT ----- Regarding: RE: INR Please try to contact patient so we have documentation  ----- Message ----- From: Eather Colas, CMA Sent: 03/07/2021   7:44 AM EDT To: Cheree Ditto, RPH Subject: FW: INR                                        Pt has resulted inr in the system done 7 days ago I was unsure if I was to make an encounter or not? ----- Message ----- From: Blane Ohara, MD Sent: 03/06/2021   8:36 PM EDT To: Eather Colas, CMA Subject: INR                                            I forwarded this patient's INR to Dr. [name] that I got at the end of last week.  It appeared he is significantly overdue.  I saw that you tried to reach out to this patient.  If you could address this please with cardiology I would greatly appreciated.  I will have my staff call and double check with the patient to see if he has heard from you.  His INR is entirely normal which certainly makes me suspicious for him not taking his Coumadin.

## 2021-03-07 NOTE — Telephone Encounter (Signed)
Called and lmomed pt to call me back asap

## 2021-03-07 NOTE — Telephone Encounter (Signed)
Pt called and and we were able to scheduled appt for coumadin on 03/14/21 at  we also spoke about the importance of taking his warfarin and the pt confessed that he hadn't been taking it as he should but that he would do better from now on.

## 2021-03-14 ENCOUNTER — Ambulatory Visit (INDEPENDENT_AMBULATORY_CARE_PROVIDER_SITE_OTHER): Payer: BC Managed Care – PPO

## 2021-03-14 ENCOUNTER — Other Ambulatory Visit: Payer: Self-pay

## 2021-03-14 DIAGNOSIS — I240 Acute coronary thrombosis not resulting in myocardial infarction: Secondary | ICD-10-CM

## 2021-03-14 DIAGNOSIS — Z5181 Encounter for therapeutic drug level monitoring: Secondary | ICD-10-CM | POA: Diagnosis not present

## 2021-03-14 DIAGNOSIS — I6340 Cerebral infarction due to embolism of unspecified cerebral artery: Secondary | ICD-10-CM

## 2021-03-14 LAB — POCT INR: INR: 1.3 — AB (ref 2.0–3.0)

## 2021-03-14 NOTE — Patient Instructions (Signed)
Description   Take another tablet today (already take 1.5 tablets today) and 1.5 tablets tomorrow and then continue warfarin 1.5 tablet daily except 1 tablet on Sunday, Wednesday and Saturday.  Recheck INR in 1 week. Coumadin Clinic: [phone]

## 2021-03-21 ENCOUNTER — Other Ambulatory Visit: Payer: Self-pay

## 2021-03-21 ENCOUNTER — Ambulatory Visit (INDEPENDENT_AMBULATORY_CARE_PROVIDER_SITE_OTHER): Payer: BC Managed Care – PPO

## 2021-03-21 DIAGNOSIS — Z5181 Encounter for therapeutic drug level monitoring: Secondary | ICD-10-CM

## 2021-03-21 DIAGNOSIS — I6340 Cerebral infarction due to embolism of unspecified cerebral artery: Secondary | ICD-10-CM | POA: Diagnosis not present

## 2021-03-21 DIAGNOSIS — I240 Acute coronary thrombosis not resulting in myocardial infarction: Secondary | ICD-10-CM | POA: Diagnosis not present

## 2021-03-21 LAB — POCT INR: INR: 1.5 — AB (ref 2.0–3.0)

## 2021-03-21 NOTE — Patient Instructions (Signed)
Take another 1.5 tablets today and then increase to 1.5 tablets daily except 1 tablet on Sunday and Saturday.  Recheck INR in 1 week. Coumadin Clinic: [phone]

## 2021-03-27 ENCOUNTER — Other Ambulatory Visit: Payer: Self-pay

## 2021-03-27 ENCOUNTER — Encounter (HOSPITAL_COMMUNITY): Payer: Self-pay | Admitting: Cardiology

## 2021-03-27 ENCOUNTER — Ambulatory Visit (HOSPITAL_BASED_OUTPATIENT_CLINIC_OR_DEPARTMENT_OTHER)
Admission: RE | Admit: 2021-03-27 | Discharge: 2021-03-27 | Disposition: A | Discharge status: Home / Self Care | Payer: BC Managed Care – PPO | Source: Ambulatory Visit | Attending: Cardiology | Admitting: Cardiology

## 2021-03-27 ENCOUNTER — Ambulatory Visit (HOSPITAL_COMMUNITY)
Admission: RE | Admit: 2021-03-27 | Discharge: 2021-03-27 | Disposition: A | Discharge status: Home / Self Care | Payer: BC Managed Care – PPO | Source: Ambulatory Visit | Attending: Cardiology | Admitting: Cardiology

## 2021-03-27 VITALS — BP 150/88 | HR 74 | Wt 293.0 lb

## 2021-03-27 DIAGNOSIS — Z09 Encounter for follow-up examination after completed treatment for conditions other than malignant neoplasm: Secondary | ICD-10-CM | POA: Diagnosis not present

## 2021-03-27 DIAGNOSIS — Z79899 Other long term (current) drug therapy: Secondary | ICD-10-CM | POA: Diagnosis not present

## 2021-03-27 DIAGNOSIS — I5042 Chronic combined systolic (congestive) and diastolic (congestive) heart failure: Secondary | ICD-10-CM | POA: Diagnosis not present

## 2021-03-27 DIAGNOSIS — Z8673 Personal history of transient ischemic attack (TIA), and cerebral infarction without residual deficits: Secondary | ICD-10-CM | POA: Diagnosis not present

## 2021-03-27 DIAGNOSIS — I110 Hypertensive heart disease with heart failure: Secondary | ICD-10-CM | POA: Diagnosis not present

## 2021-03-27 DIAGNOSIS — Z87891 Personal history of nicotine dependence: Secondary | ICD-10-CM | POA: Diagnosis not present

## 2021-03-27 DIAGNOSIS — I5022 Chronic systolic (congestive) heart failure: Secondary | ICD-10-CM | POA: Diagnosis not present

## 2021-03-27 DIAGNOSIS — F419 Anxiety disorder, unspecified: Secondary | ICD-10-CM | POA: Insufficient documentation

## 2021-03-27 DIAGNOSIS — F1511 Other stimulant abuse, in remission: Secondary | ICD-10-CM | POA: Diagnosis not present

## 2021-03-27 DIAGNOSIS — Z86718 Personal history of other venous thrombosis and embolism: Secondary | ICD-10-CM | POA: Diagnosis not present

## 2021-03-27 DIAGNOSIS — Z8616 Personal history of COVID-19: Secondary | ICD-10-CM | POA: Insufficient documentation

## 2021-03-27 DIAGNOSIS — Z7901 Long term (current) use of anticoagulants: Secondary | ICD-10-CM | POA: Insufficient documentation

## 2021-03-27 DIAGNOSIS — I428 Other cardiomyopathies: Secondary | ICD-10-CM | POA: Insufficient documentation

## 2021-03-27 DIAGNOSIS — Z7984 Long term (current) use of oral hypoglycemic drugs: Secondary | ICD-10-CM | POA: Diagnosis not present

## 2021-03-27 LAB — BASIC METABOLIC PANEL
Anion gap: 10 (ref 5–15)
BUN: 17 mg/dL (ref 6–20)
CO2: 22 mmol/L (ref 22–32)
Calcium: 9.3 mg/dL (ref 8.9–10.3)
Chloride: 102 mmol/L (ref 98–111)
Creatinine, Ser: 0.76 mg/dL (ref 0.61–1.24)
GFR, Estimated: >60 mL/min (ref >60)
Glucose, Bld: 93 mg/dL (ref 70–99)
Potassium: 4.2 mmol/L (ref 3.5–5.1)
Sodium: 134 mmol/L — ABNORMAL LOW (ref 135–145)

## 2021-03-27 LAB — ECHOCARDIOGRAM COMPLETE
Area-P 1/2: 3.96 cm2
Calc EF: 45.2 %
S' Lateral: 4.7 cm
Single Plane A2C EF: 43.5 %
Single Plane A4C EF: 43.2 %

## 2021-03-27 LAB — BRAIN NATRIURETIC PEPTIDE: B Natriuretic Peptide: 3.3 pg/mL (ref 0.0–100.0)

## 2021-03-27 MED ORDER — SPIRONOLACTONE 50 MG PO TABS: 50.0000 mg | ORAL_TABLET | Freq: Every day | ORAL | 3 refills | Status: DC

## 2021-03-27 NOTE — Patient Instructions (Addendum)
Labs done today. We will contact you only if your labs are abnormal.  INCREASE Spironolactone to 50mg  (1 tablet) by mouth daily.   STOP taking Digoxin  No other medication changes were made. Please continue all current medications as prescribed.  Your physician has requested that you regularly monitor and record your blood pressure readings at home. Please use the same machine at the same time of day to check your readings and record them to bring to your follow-up visit. Call if your Systolic Blood Pressure(Top number) is greater than 190.  Your physician recommends that you schedule a follow-up appointment in: 10 days for a lab only appointment in [place] (a prescription was provided to you during your appointment today.) and in 3 months with our APP Clinic here in our office.   If you have any questions or concerns before your next appointment please send a message through [place] or call our office at [phone].    TO LEAVE A MESSAGE FOR THE NURSE SELECT OPTION 2, PLEASE LEAVE A MESSAGE INCLUDING: YOUR NAME DATE OF BIRTH CALL BACK NUMBER REASON FOR CALL**this is important as we prioritize the call backs  YOU WILL RECEIVE A CALL BACK THE SAME DAY AS LONG AS YOU CALL BEFORE 4:00 PM   Do the following things EVERYDAY: Weigh yourself in the morning before breakfast. Write it down and keep it in a log. Take your medicines as prescribed Eat low salt foods--Limit salt (sodium) to 2000 mg per day.  Stay as active as you can everyday Limit all fluids for the day to less than 2 liters   At the [facility], you and your health needs are our priority. As part of our continuing mission to provide you with exceptional heart care, we have created designated Provider Care Teams. These Care Teams include your primary Cardiologist (physician) and Advanced Practice Providers (APPs- Physician Assistants and Nurse Practitioners) who all work together to provide you with the  care you need, when you need it.   You may see any of the following providers on your designated Care Team at your next follow up: Dr [phone] Dr Arvilla Meres, NP Carron Curie, Robbie Lis [place], PharmD   Please be sure to bring in all your medications bottles to every appointment.

## 2021-03-27 NOTE — Progress Notes (Signed)
PCP: Blane Ohara, MD  Cardiology: Dr. Leotis Pain Gebhart is 25 y.o. male with a hx of hypertension, anxiety and prior COVID-19 infection in June in 2021 who presented to [facility] in 10/21 with shortness of breath and hemoptysis x 1 month.  An echocardiogram demonstrated EF < 20% and an LV thrombus.  A CT scan demonstrated multifocal pneumonia, no PE.  He was started on furosemide and Lovenox and transferred to [facility] for further evaluation and management.  He was started on milrinone with low output by co-ox and diuresed. Cardiac MRI showed severe LV dilation with diffuse hypokinesis, EF 15%, small LV apical thrombus, moderate RV dilation with EF 19%, LGE pattern concerning for myocarditis with diffuse patchy mid-wall LGE.  He was treated with ceftriaxone and doxycycline for PNA. He had a CVA with left-sided weakness and left facial numbness.  MRI showed multifocal bilateral infarcts consistent with cardio-embolism.  He was transitioned from Lovenox/warfarin to IV heparin gtt/warfarin.  Left-sided weakness resolved. He was titrated onto cardiac meds and eventually discharged home.   Echo in 3/22 showed EF 35% with global hypokinesis, mild LV dilation, mildly decreased RV systolic function. No LV thrombus.   Echo was done today and reviewed, EF 45% with mild LVH, diffuse hypokinesis, normal RV, no thrombus.   He returns today for followup of CHF.  He walks 3.5 miles/day working in [employer] houses.  No significant exertional dyspnea.  No orthopnea/PND.  Weight up 13 lbs. BP elevated today, he took his meds.  Of note, he was using crystal meth in the recent past but has quit and is in a rehab program.   Labs (10/21): K 4.9, creatinine 1.06 Labs (11/21): K 5, creatinine 0.96 Labs (12/21): digoxin 0.7, LDL 80, HDL 50, K 4.3, creatinine 6.38 Labs (7/22): K 4.7, creatinine 1.11  PMH: 1. H/o COVID-19 PNA 6/21.  2. HTN 3. Anxiety 4. Chronic systolic CHF: Nonischemic cardiomyopathy.    - Echo (10/21): EF < 20%, LV thrombus.  - Cardiac MRI (10/21): Severe LV dilation with diffuse hypokinesis, EF 15%, small LV apical thrombus, moderate RV dilation with EF 19%, LGE pattern concerning for myocarditis, with diffuse patchy mid-wall LGE. - Echo (3/22): EF 35% with global hypokinesis, mild LV dilation, mildly decreased RV systolic function. No LV thrombus. - Echo (9/22): EF 45% with mild LVH, diffuse hypokinesis, normal RV, no thrombus.  5. CVA: 10/21, suspect cardio-embolic.  6. LV apical thrombus 7. Sleep study negative.  8. Crystal meth abuse  Social History   Socioeconomic History   Marital status: Single    Spouse name: Not on file   Number of children: Not on file   Years of education: Not on file   Highest education level: Not on file  Occupational History   Occupation: Full time/Plumbing  Tobacco Use   Smoking status: Former    Types: E-cigarettes   Smokeless tobacco: Current    Types: Chew   Tobacco comments:    quit around June 2021  Vaping Use   Vaping Use: Former   Substances: Nicotine  Substance and Sexual Activity   Alcohol use: Not Currently    Comment: Patient used to drink occasionally a beer, but stopped when started lexapro   Drug use: Never   Sexual activity: Yes    Birth control/protection: Condom  Other Topics Concern   Not on file  Social History Narrative   Lives with grandmother, grandfather and sister   Right Handed   Drinks rarely caffeine  Social Determinants of Health   Financial Resource Strain: Not on file  Food Insecurity: Not on file  Transportation Needs: Not on file  Physical Activity: Not on file  Stress: Not on file  Social Connections: Not on file  Intimate Partner Violence: Not on file   Family History  Problem Relation Age of Onset   Diabetes Mother    Diabetes Paternal Grandfather    Heart failure Neg Hx    ROS: All systems reviewed and negative except as per HPI.   Current Outpatient Medications   Medication Sig Dispense Refill   atorvastatin (LIPITOR) 10 MG tablet TAKE ONE TABLET BY MOUTH EVERY DAY 30 tablet 5   carvedilol (COREG) 25 MG tablet Take 1 tablet (25 mg total) by mouth 2 (two) times daily with a meal. 180 tablet 3   dapagliflozin propanediol (FARXIGA) 10 MG TABS tablet TAKE 1 TABLET (10 MG TOTAL) BY MOUTH DAILY. 30 tablet 6   ENTRESTO 97-103 MG TAKE ONE TABLET BY MOUTH TWICE DAILY 60 tablet 11   fexofenadine (ALLEGRA) 60 MG tablet Take 60 mg by mouth daily as needed for allergies or rhinitis.     hydrOXYzine (ATARAX/VISTARIL) 50 MG tablet Take 2 tablets (100 mg total) by mouth at bedtime as needed for anxiety. 120 tablet 0   montelukast (SINGULAIR) 10 MG tablet Take 10 mg by mouth at bedtime.     warfarin (COUMADIN) 5 MG tablet TAKE ONE TABLET BY MOUTH EVERY DAY or as directed (Patient taking differently: Take 5 mg by mouth daily. 1 1/2 tabs. Mon. Wed and Friday 1 tablet all other day) 30 tablet 2   spironolactone (ALDACTONE) 50 MG tablet Take 1 tablet (50 mg total) by mouth daily. 90 tablet 3   No current facility-administered medications for this encounter.   BP (!) 150/88   Pulse 74   Wt 132.9 kg (293 lb)   SpO2 99%   BMI 42.04 kg/m  General: NAD Neck: No JVD, no thyromegaly or thyroid nodule.  Lungs: Clear to auscultation bilaterally with normal respiratory effort. CV: Nondisplaced PMI.  Heart regular S1/S2, no S3/S4, no murmur.  No peripheral edema.  No carotid bruit.  Normal pedal pulses.  Abdomen: Soft, nontender, no hepatosplenomegaly, no distention.  Skin: Intact without lesions or rashes.  Neurologic: Alert and oriented x 3.  Psych: Normal affect. Extremities: No clubbing or cyanosis.  HEENT: Normal.   Assessment/Plan: 1. Chronic systolic CHF: Appears to be nonischemic cardiomyopathy.  Echo in 10/21 at [facility] with EF < 20%, LV thrombus noted.  When he presented in 10/21, he had had dyspnea, orthopnea, and pleuritic chest pain since he had COVID-19 in  6/21. He feels like he never improved.  No family history of cardiomyopathy or premature CAD.  HIV negative. CMRI in 10/21 with LVEF 15%, RVEF 19%, LGE pattern concerning for myocarditis. Suspect most likely etiology for cardiomyopathy is viral myocarditis due to COVID-19 though methamphetamine use may have contributed as well.  Echo in 3/22 showed improved EF to 35%. Echo today showed EF up to 45%. NYHA class I, not volume overloaded. Increased weight is likely caloric rather than due to CHF.   - With improvement in EF, think he can stop digoxin.  - Continue Entresto 97/103 bid.   - Increase spironolactone to 50 mg daily with high BP.  Check BMET today and in 10 days.   - Continue Farxiga 10 mg daily  - Continue Coreg 25 mg bid.  - EF has improved, now >  35%.  Not ICD candidate.  2. LV thrombus: On warfarin, INR followed at [facility] in [place]. No thrombus seen on today's echo.  3. CVA: Cardioembolic from LV thrombus.  Seems to have minimal residual symptoms (mild tingling left face). - Continue warfarin.  4. Methamphetamine abuse: Has stopped and is in a rehab program.  - Strongly urged him not to relapse.  5. HTN: BP elevated today. Increasing spironolactone and will have him check BP daily at home. Let me know if SBP runs consistently > 140.   Followup in 3 months with APP.   [name] 03/27/2021

## 2021-03-27 NOTE — Progress Notes (Signed)
  Echocardiogram 2D Echocardiogram has been performed.  Leta Jungling M 03/27/2021, 10:42 AM

## 2021-03-28 ENCOUNTER — Other Ambulatory Visit (HOSPITAL_COMMUNITY): Payer: Self-pay | Admitting: Cardiology

## 2021-03-30 ENCOUNTER — Encounter: Payer: Self-pay | Admitting: Adult Health

## 2021-03-30 ENCOUNTER — Ambulatory Visit (INDEPENDENT_AMBULATORY_CARE_PROVIDER_SITE_OTHER): Payer: BC Managed Care – PPO | Admitting: Adult Health

## 2021-03-30 ENCOUNTER — Other Ambulatory Visit: Payer: Self-pay

## 2021-03-30 VITALS — BP 133/75 | HR 71 | Ht 73.0 in | Wt 292.0 lb

## 2021-03-30 DIAGNOSIS — I5042 Chronic combined systolic (congestive) and diastolic (congestive) heart failure: Secondary | ICD-10-CM | POA: Diagnosis not present

## 2021-03-30 DIAGNOSIS — I63139 Cerebral infarction due to embolism of unspecified carotid artery: Secondary | ICD-10-CM | POA: Diagnosis not present

## 2021-03-30 NOTE — Progress Notes (Signed)
[facility] [address] [place]. [place] [address] [phone]       OFFICE FOLLOW-UP NOTE  [name] Date of Birth:  07-19-1995 Medical Record Number:  [id number]    Reason for visit: stroke f/u   Chief Complaint  Patient presents with   Follow-up    RM 1 alone Pt is well and stable       HPI:   Today, 03/30/2021, Joel [name] is being seen for 90-month stroke follow-up unaccompanied.  Stable from stroke standpoint.  Denies new stroke/TIA symptoms.  Compliant on warfarin and atorvastatin tolerating without side effects.  Routinely monitored by Coumadin clinic and cardiology.  Repeat INR level next week. Blood pressure today 133/75 - cards increased spirolactone with improvement of BP. Monitors at home and similar to todays reading.  He did go through a rough patch back in July/August where he stopped taking his medications and use of methamphetamines.  He has since distanced himself from that group of people and denies any additional use of methamphetamines and has been compliant with all medications.  No new concerns at this time.     History provided for reference purposes only Update 09/02/2020 JM Joel [name] returns for 71-month stroke follow-up accompanied by his grandmother  Stable since prior visit without new stroke/TIA symptoms Denies residual deficits  Compliant on warfarin without associated side effects INR levels monitored by Coumadin clinic-recent INR 1.3 stable months prior and plans on repeating 3/29  Blood pressure today 117/76 Remains on atorvastatin 10 mg daily with recent LDL 88  Routinely followed by cardiology with recent 2D echo showing EF 35% and resolution of LV thrombus  No concerns at this time  Initial visit 05/31/2020 Dr. Pearlean [name]: Joel [name] is a 25 year old male seen today for initial office follow-up visit following hospital consultation for stroke in October 2021.  Patient has past medical history of  hypertension, anxiety, COVID-19 infection in June 2021 followed by myocarditis with left ventricular apical thrombus.  He presented to [facility] with shortness of breath and hemoptysis for a month and echocardiogram there showed ejection fraction less than 20% with left ventricular apical thrombus and CT scan showing multifocal pneumonia.  He was started on IV heparin and Lasix and transferred to [facility] on 04/24/2020.  He developed sudden onset of right flaccid hemiplegia on 04/25/2020 with NIH stroke scale of 7.  He was not a candidate for TPA as he had received low molecular weight heparin within 24 hours.  CT angiogram of the head and neck both showed no significant large vessel stenosis or occlusion.  MRI scan shows multiple bifrontal parietal left MCA and bilateral ACA territory embolic infarcts.  LDL cholesterol is 85 mg percent.  Hemoglobin A1c was 5.8.  Cardiac MRI was obtained which showed ejection fraction less than 15% with apical clot.  He appears to be at risk for obstructive sleep apnea and was screened for the sleep smart stroke prevention study but did not meet criteria.  Patient was initially started on IV heparin and bridged to warfarin.  He states he has done well since discharge.  His right-sided weakness has improved.  He is living at home with his grandmother.  Tolerating warfarin well without bruising or bleeding and last INR on 05/24/2020 was optimal at 2.4.  His blood pressure is usually well controlled today it is elevated at 169/18 office.  He has followed up in the cardiology clinic.  He states he occasionally gets intermittent paresthesias on  his face but his strength overall has improved.  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  He has no new complaints today.     ROS:   14 system review of systems is positive for those listed in HPI and all other systems negative  PMH:  Past Medical History:  Diagnosis Date   Anxiety    Cerebral  embolism with cerebral infarction 04/28/2020   Chronic systolic CHF 04/24/2020   Echo at [facility] 04/2020: EF < 20, mild to mod MR, LV thrombus   Depression    Dilated cardiomyopathy (HCC)    History of COVID-19    Hypertension    LV (left ventricular) mural thrombus     Social History:  Social History   Socioeconomic History   Marital status: Single    Spouse name: Not on file   Number of children: Not on file   Years of education: Not on file   Highest education level: Not on file  Occupational History   Occupation: Full time/Plumbing  Tobacco Use   Smoking status: Former    Types: E-cigarettes   Smokeless tobacco: Current    Types: Chew   Tobacco comments:    quit around June 2021  Vaping Use   Vaping Use: Former   Substances: Nicotine  Substance and Sexual Activity   Alcohol use: Not Currently    Comment: Patient used to drink occasionally a beer, but stopped when started lexapro   Drug use: Never   Sexual activity: Yes    Birth control/protection: Condom  Other Topics Concern   Not on file  Social History Narrative   Lives with grandmother, grandfather and sister   Right Handed   Drinks rarely caffeine   Social Determinants of Health   Financial Resource Strain: Not on file  Food Insecurity: Not on file  Transportation Needs: Not on file  Physical Activity: Not on file  Stress: Not on file  Social Connections: Not on file  Intimate Partner Violence: Not on file    Medications:   Current Outpatient Medications on File Prior to Visit  Medication Sig Dispense Refill   atorvastatin (LIPITOR) 10 MG tablet TAKE ONE TABLET BY MOUTH EVERY DAY 30 tablet 5   carvedilol (COREG) 25 MG tablet Take 1 tablet (25 mg total) by mouth 2 (two) times daily with a meal. 180 tablet 3   dapagliflozin propanediol (FARXIGA) 10 MG TABS tablet TAKE 1 TABLET (10 MG TOTAL) BY MOUTH DAILY. 30 tablet 6   ENTRESTO 97-103 MG TAKE ONE TABLET BY MOUTH TWICE DAILY 60 tablet 11    fexofenadine (ALLEGRA) 60 MG tablet Take 60 mg by mouth daily as needed for allergies or rhinitis.     hydrOXYzine (ATARAX/VISTARIL) 50 MG tablet Take 2 tablets (100 mg total) by mouth at bedtime as needed for anxiety. 120 tablet 0   montelukast (SINGULAIR) 10 MG tablet Take 10 mg by mouth at bedtime.     spironolactone (ALDACTONE) 50 MG tablet Take 1 tablet (50 mg total) by mouth daily. 90 tablet 3   warfarin (COUMADIN) 5 MG tablet Take 1 tablet (5 mg total) by mouth daily. 1 1/2 tabs. Mon. Wed and Friday 1 tablet all other day 90 tablet 5   No current facility-administered medications on file prior to visit.    Allergies:  No Known Allergies  Physical Exam Today's Vitals   03/30/21 1332  BP: 133/75  Pulse: 71  Weight: 292 lb (132.5  kg)  Height: 6\' 1"  (1.854 m)   Body mass index is 38.52 kg/m.   General: Obese pleasant young male, seated, in no evident distress Head: head normocephalic and atraumatic.  Neck: supple with no carotid or supraclavicular bruits Cardiovascular: regular rate and rhythm, no murmurs Musculoskeletal: no deformity Skin:  no rash/petichiae Vascular:  Normal pulses all extremities  Neurologic Exam Mental Status: Awake and fully alert.  Fluent speech and language.  Oriented to place and time. Recent and remote memory intact. Attention span, concentration and fund of knowledge appropriate. Mood and affect appropriate.  Cranial Nerves: Pupils equal, briskly reactive to light. Extraocular movements full without nystagmus. Visual fields full to confrontation. Hearing intact. Facial sensation intact. Face, tongue, palate moves normally and symmetrically.  Motor: Normal bulk and tone. Normal strength in all tested extremity muscles. Sensory.: intact to touch ,pinprick .position and vibratory sensation.  Coordination: Rapid alternating movements normal in all extremities. Finger-to-nose and heel-to-shin performed accurately bilaterally. Gait and Station: Arises from  chair without difficulty. Stance is normal. Gait demonstrates normal stride length and balance . Able to heel, toe and tandem walk without difficulty Reflexes: 1+ and symmetric. Toes downgoing.       ASSESSMENT/PLAN: 25 year old Caucasian male with past cerebral embolic infarcts in October 2021 secondary to cardiogenic embolism from left ventricular apical clot from Covid related myocarditis and cardiomyopathy (EF 15%).      Multifocal bilateral infarcts, embolic -No residual deficits -Continue warfarin daily and atorvastatin 10 mg daily for secondary stroke prevention  -Discussed secondary stroke prevention measures and importance of close PCP follow-up for aggressive stroke risk factor management including HTN with BP goal<130/90 and HLD with LDL goal<70 -Discussed importance of medication compliance and avoidance of drug use  CHF -Likely in setting of viral myocarditis due to COVID-19 -Followed by cardiology Dr. November 2021 -Recent echo EF of 45% (03/2021) up from 35%  -Continue current treatment regimen per cardiology    Overall stable from stroke standpoint with routine follow-up with cardiology PCP for aggressive stroke risk factor management therefore recommend follow-up on an as-needed basis     CC:  Cox, Kirsten, MD   I spent 29 minutes of face-to-face and non-face-to-face time with patient.  This included previsit chart review, lab review, study review, electronic health record documentation, patient education and discussion regarding history of prior stroke, importance of managing stroke risk factors CHF with recent echo and ongoing use of warfarin, importance of routine follow-up with PCP and cardiology and answered all other questions to patient satisfaction  05-[birth year]-1980, [facility]  [facility] [address] [place], [place]  Phone [phone] Fax [phone] Note: This document was prepared with digital dictation and  possible smart phrase technology. Any transcriptional errors that result from this process are unintentional.

## 2021-03-30 NOTE — Patient Instructions (Signed)
Continue warfarin daily  and atorvastatin 10mg  daily  for secondary stroke prevention  Continue to follow up with PCP regarding cholesterol and blood pressure management  Maintain strict control of hypertension with blood pressure goal below 130/90 and cholesterol with LDL cholesterol (bad cholesterol) goal below 70 mg/dL.          Thank you for coming to see at [facility]. I hope we have been able to provide you high quality care today.  You may receive a patient satisfaction survey over the next few weeks. We would appreciate your feedback and comments so that we may continue to improve ourselves and the health of our patients.

## 2021-04-04 ENCOUNTER — Other Ambulatory Visit: Payer: Self-pay

## 2021-04-04 ENCOUNTER — Ambulatory Visit (INDEPENDENT_AMBULATORY_CARE_PROVIDER_SITE_OTHER): Payer: BC Managed Care – PPO

## 2021-04-04 DIAGNOSIS — I6340 Cerebral infarction due to embolism of unspecified cerebral artery: Secondary | ICD-10-CM

## 2021-04-04 DIAGNOSIS — I240 Acute coronary thrombosis not resulting in myocardial infarction: Secondary | ICD-10-CM

## 2021-04-04 DIAGNOSIS — Z5181 Encounter for therapeutic drug level monitoring: Secondary | ICD-10-CM | POA: Diagnosis not present

## 2021-04-04 LAB — POCT INR: INR: 1.9 — AB (ref 2.0–3.0)

## 2021-04-04 NOTE — Patient Instructions (Signed)
Description   Take 2 tablets today and then Start taking 1.5 tablets daily except 1 tablet on Sunday.  Recheck INR in 1 week. Coumadin Clinic: [phone]

## 2021-04-07 ENCOUNTER — Other Ambulatory Visit: Payer: Self-pay

## 2021-04-07 ENCOUNTER — Ambulatory Visit: Payer: BC Managed Care – PPO

## 2021-04-07 DIAGNOSIS — I5022 Chronic systolic (congestive) heart failure: Secondary | ICD-10-CM | POA: Diagnosis not present

## 2021-04-07 LAB — BASIC METABOLIC PANEL
BUN/Creatinine Ratio: 19 (ref 9–20)
BUN: 15 mg/dL (ref 6–20)
CO2: 21 mmol/L (ref 20–29)
Calcium: 9.3 mg/dL (ref 8.7–10.2)
Chloride: 102 mmol/L (ref 96–106)
Creatinine, Ser: 0.8 mg/dL (ref 0.76–1.27)
Glucose: 96 mg/dL (ref 70–99)
Potassium: 4.4 mmol/L (ref 3.5–5.2)
Sodium: 137 mmol/L (ref 134–144)
eGFR: 128 mL/min/{1.73_m2} (ref >59)

## 2021-04-07 MED ORDER — DAPAGLIFLOZIN PROPANEDIOL 10 MG PO TABS: ORAL_TABLET | Freq: Every day | ORAL | 0 refills | Status: DC

## 2021-04-11 ENCOUNTER — Other Ambulatory Visit (HOSPITAL_COMMUNITY): Payer: Self-pay

## 2021-04-18 ENCOUNTER — Other Ambulatory Visit: Payer: Self-pay

## 2021-04-18 ENCOUNTER — Ambulatory Visit (INDEPENDENT_AMBULATORY_CARE_PROVIDER_SITE_OTHER): Payer: BC Managed Care – PPO

## 2021-04-18 DIAGNOSIS — I6340 Cerebral infarction due to embolism of unspecified cerebral artery: Secondary | ICD-10-CM

## 2021-04-18 DIAGNOSIS — I240 Acute coronary thrombosis not resulting in myocardial infarction: Secondary | ICD-10-CM | POA: Diagnosis not present

## 2021-04-18 DIAGNOSIS — Z5181 Encounter for therapeutic drug level monitoring: Secondary | ICD-10-CM | POA: Diagnosis not present

## 2021-04-18 LAB — POCT INR: INR: 1.4 — AB (ref 2.0–3.0)

## 2021-04-18 NOTE — Patient Instructions (Signed)
Description   Take 2 tablets today and 2 tablets tomorrow continue taking 1.5 tablets daily except 1 tablet on Sunday.  Recheck INR in 1 week. Coumadin Clinic: [phone]

## 2021-04-25 ENCOUNTER — Ambulatory Visit (INDEPENDENT_AMBULATORY_CARE_PROVIDER_SITE_OTHER): Payer: BC Managed Care – PPO

## 2021-04-25 ENCOUNTER — Other Ambulatory Visit: Payer: Self-pay

## 2021-04-25 DIAGNOSIS — I240 Acute coronary thrombosis not resulting in myocardial infarction: Secondary | ICD-10-CM | POA: Diagnosis not present

## 2021-04-25 DIAGNOSIS — Z5181 Encounter for therapeutic drug level monitoring: Secondary | ICD-10-CM

## 2021-04-25 DIAGNOSIS — I6340 Cerebral infarction due to embolism of unspecified cerebral artery: Secondary | ICD-10-CM | POA: Diagnosis not present

## 2021-04-25 LAB — POCT INR: INR: 2.2 (ref 2.0–3.0)

## 2021-04-25 NOTE — Patient Instructions (Signed)
Description   Continue taking 1.5 tablets daily except 1 tablet on Sunday.  Recheck INR in 2 weeks. Coumadin Clinic: [phone]

## 2021-04-27 ENCOUNTER — Other Ambulatory Visit (HOSPITAL_COMMUNITY): Payer: Self-pay | Admitting: *Deleted

## 2021-04-27 ENCOUNTER — Other Ambulatory Visit (HOSPITAL_COMMUNITY): Payer: Self-pay

## 2021-04-27 ENCOUNTER — Telehealth (HOSPITAL_COMMUNITY): Payer: Self-pay | Admitting: Pharmacy Technician

## 2021-04-27 MED ORDER — DAPAGLIFLOZIN PROPANEDIOL 10 MG PO TABS: ORAL_TABLET | Freq: Every day | ORAL | 0 refills | Status: AC

## 2021-04-27 MED ORDER — ENTRESTO 97-103 MG PO TABS: 1.0000 | ORAL_TABLET | Freq: Two times a day (BID) | ORAL | 3 refills | Status: DC

## 2021-04-27 NOTE — Telephone Encounter (Signed)
Advanced Heart Failure Patient Advocate Encounter  I received a renewal request for Farxiga assistance from [employer]. The patient now has Financial risk analyst. The 90 day co-pay for [place] Joel [name] are $0. Called to update the patient and confirm which pharmacy he would like to pick up the medication from. Left message with the patient's family member to have the patient return my call.

## 2021-04-27 NOTE — Telephone Encounter (Signed)
Spoke with the patient. Sent 90 day RX request for [place] and Sherryll Burger to [facility] ([facility]MA) to send to [facility] in [place].   Archer Asa, CPhT

## 2021-05-16 ENCOUNTER — Ambulatory Visit (INDEPENDENT_AMBULATORY_CARE_PROVIDER_SITE_OTHER): Payer: BC Managed Care – PPO

## 2021-05-16 ENCOUNTER — Other Ambulatory Visit: Payer: Self-pay

## 2021-05-16 DIAGNOSIS — I6340 Cerebral infarction due to embolism of unspecified cerebral artery: Secondary | ICD-10-CM | POA: Diagnosis not present

## 2021-05-16 DIAGNOSIS — I240 Acute coronary thrombosis not resulting in myocardial infarction: Secondary | ICD-10-CM | POA: Diagnosis not present

## 2021-05-16 DIAGNOSIS — Z5181 Encounter for therapeutic drug level monitoring: Secondary | ICD-10-CM | POA: Diagnosis not present

## 2021-05-16 LAB — POCT INR: INR: 1.1 — AB (ref 2.0–3.0)

## 2021-05-16 NOTE — Patient Instructions (Signed)
Description   Take 2 tablets today, 2 tablets tomorrow and 2 tablets Thursday and then continue taking 1.5 tablets daily except 1 tablet on Sunday.  Recheck INR in 1 week. Coumadin Clinic: [phone]

## 2021-05-23 ENCOUNTER — Ambulatory Visit (INDEPENDENT_AMBULATORY_CARE_PROVIDER_SITE_OTHER): Payer: BC Managed Care – PPO

## 2021-05-23 ENCOUNTER — Other Ambulatory Visit: Payer: Self-pay

## 2021-05-23 DIAGNOSIS — Z5181 Encounter for therapeutic drug level monitoring: Secondary | ICD-10-CM

## 2021-05-23 DIAGNOSIS — I240 Acute coronary thrombosis not resulting in myocardial infarction: Secondary | ICD-10-CM

## 2021-05-23 DIAGNOSIS — I6340 Cerebral infarction due to embolism of unspecified cerebral artery: Secondary | ICD-10-CM

## 2021-05-23 LAB — POCT INR: INR: 1.2 — AB (ref 2.0–3.0)

## 2021-05-23 NOTE — Patient Instructions (Signed)
Description   Take 2 tablets today, 2 tablets tomorrow and 2 tablets Thursday and then continue taking 1.5 tablets daily except 1 tablet on Sunday.  Recheck INR in 1 week. Coumadin Clinic: [phone]

## 2021-05-30 ENCOUNTER — Other Ambulatory Visit: Payer: Self-pay

## 2021-05-30 ENCOUNTER — Ambulatory Visit (INDEPENDENT_AMBULATORY_CARE_PROVIDER_SITE_OTHER): Payer: BC Managed Care – PPO

## 2021-05-30 DIAGNOSIS — I240 Acute coronary thrombosis not resulting in myocardial infarction: Secondary | ICD-10-CM

## 2021-05-30 DIAGNOSIS — Z5181 Encounter for therapeutic drug level monitoring: Secondary | ICD-10-CM

## 2021-05-30 DIAGNOSIS — I6340 Cerebral infarction due to embolism of unspecified cerebral artery: Secondary | ICD-10-CM | POA: Diagnosis not present

## 2021-05-30 LAB — POCT INR: INR: 1.9 — AB (ref 2.0–3.0)

## 2021-05-30 NOTE — Patient Instructions (Signed)
Description   Take 2 tablets today and then continue taking 1.5 tablets daily except 1 tablet on Sunday.  Recheck INR in 2 weeks. Coumadin Clinic: [phone]

## 2021-06-13 ENCOUNTER — Other Ambulatory Visit: Payer: Self-pay

## 2021-06-13 ENCOUNTER — Ambulatory Visit (INDEPENDENT_AMBULATORY_CARE_PROVIDER_SITE_OTHER): Payer: BC Managed Care – PPO

## 2021-06-13 DIAGNOSIS — Z5181 Encounter for therapeutic drug level monitoring: Secondary | ICD-10-CM

## 2021-06-13 DIAGNOSIS — I6340 Cerebral infarction due to embolism of unspecified cerebral artery: Secondary | ICD-10-CM | POA: Diagnosis not present

## 2021-06-13 DIAGNOSIS — I240 Acute coronary thrombosis not resulting in myocardial infarction: Secondary | ICD-10-CM | POA: Diagnosis not present

## 2021-06-13 LAB — POCT INR: INR: 4 — AB (ref 2.0–3.0)

## 2021-06-13 NOTE — Patient Instructions (Signed)
HOLD tomorrow only  and then continue taking 1.5 tablets daily except 1 tablet on Sunday. Recheck INR in 2 weeks. Coumadin Clinic: [phone]

## 2021-06-23 ENCOUNTER — Telehealth (HOSPITAL_COMMUNITY): Payer: Self-pay

## 2021-06-23 NOTE — Telephone Encounter (Signed)
Called to confirm/remind patient of their appointment at the Advanced Heart Failure Clinic on 06/25/26.   Patient reminded to bring all medications and/or complete list.  Confirmed patient has transportation. Gave directions, instructed to utilize valet parking.  Confirmed appointment prior to ending call.

## 2021-06-23 NOTE — Progress Notes (Signed)
PCP: Blane Ohara, MD  Cardiology: Dr. Leotis Pain [name] is 25 y.o. male with a hx of hypertension, anxiety and prior COVID-19 infection in June in 2021 who presented to [facility] in 10/21 with shortness of breath and hemoptysis x 1 month.  An echocardiogram demonstrated EF < 20% and an LV thrombus.  A CT scan demonstrated multifocal pneumonia, no PE.  He was started on furosemide and Lovenox and transferred to [facility] for further evaluation and management.  He was started on milrinone with low output by co-ox and diuresed. Cardiac MRI showed severe LV dilation with diffuse hypokinesis, EF 15%, small LV apical thrombus, moderate RV dilation with EF 19%, LGE pattern concerning for myocarditis with diffuse patchy mid-wall LGE.  He was treated with ceftriaxone and doxycycline for PNA. He had a CVA with left-sided weakness and left facial numbness.  MRI showed multifocal bilateral infarcts consistent with cardio-embolism.  He was transitioned from Lovenox/warfarin to IV heparin gtt/warfarin.  Left-sided weakness resolved. He was titrated onto cardiac meds and eventually discharged home.   Echo in 3/22 showed EF 35% with global hypokinesis, mild LV dilation, mildly decreased RV systolic function. No LV thrombus.     Echo 9/22 showed EF 45% with mild LVH, diffuse hypokinesis, normal RV, no thrombus.   Today he returns for HF follow up with his grandmother. Overall feeling fine. 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 [place]. Denies palpitations, abnormal bleeding, SOB, CP, dizziness, edema, or PND/Orthopnea. Appetite ok. No fever or chills. Weight at home 316 pounds. Taking all medications. No longer using crystal meth, completed rehab program. Weight continues to creep up. Drinks a lot of fluid during the day and has fast food for most lunches.  ReDs: 37%  Labs (10/21): K 4.9, creatinine 1.06 Labs (11/21): K 5, creatinine 0.96 Labs (12/21): digoxin 0.7, LDL 80, HDL 50, K 4.3,  creatinine 0.84 Labs (7/22): K 4.7, creatinine 1.11 Labs (10/22): K 4.4, creatinine 0.80  PMH: 1. H/o COVID-19 PNA 6/21.  2. HTN 3. Anxiety 4. Chronic systolic CHF: Nonischemic cardiomyopathy.   - Echo (10/21): EF < 20%, LV thrombus.  - Cardiac MRI (10/21): Severe LV dilation with diffuse hypokinesis, EF 15%, small LV apical thrombus, moderate RV dilation with EF 19%, LGE pattern concerning for myocarditis, with diffuse patchy mid-wall LGE. - Echo (3/22): EF 35% with global hypokinesis, mild LV dilation, mildly decreased RV systolic function. No LV thrombus. - Echo (9/22): EF 45% with mild LVH, diffuse hypokinesis, normal RV, no thrombus.  5. CVA: 10/21, suspect cardio-embolic.  6. LV apical thrombus 7. Sleep study negative.  8. Crystal meth abuse  Social History   Socioeconomic History   Marital status: Single    Spouse name: Not on file   Number of children: Not on file   Years of education: Not on file   Highest education level: Not on file  Occupational History   Occupation: Full time/Plumbing  Tobacco Use   Smoking status: Former    Types: E-cigarettes   Smokeless tobacco: Current    Types: Chew   Tobacco comments:    quit around June 2021  Vaping Use   Vaping Use: Former   Substances: Nicotine  Substance and Sexual Activity   Alcohol use: Not Currently    Comment: Patient used to drink occasionally a beer, but stopped when started lexapro   Drug use: Never   Sexual activity: Yes    Birth control/protection: Condom  Other Topics Concern  Not on file  Social History Narrative   Lives with grandmother, grandfather and sister   Right Handed   Drinks rarely caffeine   Social Determinants of Health   Financial Resource Strain: Not on file  Food Insecurity: Not on file  Transportation Needs: Not on file  Physical Activity: Not on file  Stress: Not on file  Social Connections: Not on file  Intimate Partner Violence: Not on file   Family History  Problem  Relation Age of Onset   Diabetes Mother    Diabetes Paternal Grandfather    Heart failure Neg Hx    ROS: All systems reviewed and negative except as per HPI.   Current Outpatient Medications  Medication Sig Dispense Refill   atorvastatin (LIPITOR) 10 MG tablet TAKE ONE TABLET BY MOUTH EVERY DAY 30 tablet 5   carvedilol (COREG) 25 MG tablet Take 1 tablet (25 mg total) by mouth 2 (two) times daily with a meal. 180 tablet 3   dapagliflozin propanediol (FARXIGA) 10 MG TABS tablet TAKE 1 TABLET (10 MG TOTAL) BY MOUTH DAILY. 90 tablet 0   fexofenadine (ALLEGRA) 60 MG tablet Take 60 mg by mouth daily as needed for allergies or rhinitis.     hydrOXYzine (ATARAX/VISTARIL) 50 MG tablet Take 2 tablets (100 mg total) by mouth at bedtime as needed for anxiety. 120 tablet 0   montelukast (SINGULAIR) 10 MG tablet Take 10 mg by mouth at bedtime.     sacubitril-valsartan (ENTRESTO) 97-103 MG Take 1 tablet by mouth 2 (two) times daily. 180 tablet 3   spironolactone (ALDACTONE) 50 MG tablet Take 1 tablet (50 mg total) by mouth daily. 90 tablet 3   warfarin (COUMADIN) 5 MG tablet daily. Patient takes 1 and 1/2 every day except Sundays and takes 1 tablet on Sundays.     No current facility-administered medications for this encounter.   Wt Readings from Last 3 Encounters:  06/27/21 (!) 143.6 kg (316 lb 9.6 oz)  03/30/21 132.5 kg (292 lb)  03/27/21 132.9 kg (293 lb)   BP 104/68    Pulse 84    Wt (!) 143.6 kg (316 lb 9.6 oz)    SpO2 97%    BMI 41.77 kg/m  General:  NAD. No resp difficulty HEENT: Normal Neck: Supple. JVP 7-8. Carotids 2+ bilat; no bruits. No lymphadenopathy or thryomegaly appreciated. Cor: PMI nondisplaced. Regular rate & rhythm. No rubs, gallops or murmurs. Lungs: Clear Abdomen: Obese,  nontender, nondistended. No hepatosplenomegaly. No bruits or masses. Good bowel sounds. Extremities: No cyanosis, clubbing, rash, edema Neuro: Alert & oriented x 3, cranial nerves grossly intact. Moves all  4 extremities w/o difficulty. Affect pleasant.  Assessment/Plan: 1. Chronic systolic CHF: Appears to be nonischemic cardiomyopathy.  Echo in 10/21 at [facility] with EF < 20%, LV thrombus noted.  When he presented in 10/21, he had had dyspnea, orthopnea, and pleuritic chest pain since he had COVID-19 in 6/21. He feels like he never improved.  No family history of cardiomyopathy or premature CAD.  HIV negative. CMRI in 10/21 with LVEF 15%, RVEF 19%, LGE pattern concerning for myocarditis. Suspect most likely etiology for cardiomyopathy is viral myocarditis due to COVID-19 though methamphetamine use may have contributed as well.  Echo in 3/22 showed improved EF to 35%. Echo 9/22 showed EF up to 45%. NYHA class I, mildly fluid overloaded, ReDs 37%. Increased weight is likely in part caloric + CHF.   - Start Lasix 40 mg daily + 20 mEq KCL x 5 days,  then PRN weight gain/edema. BMET/BNP today, repeat BMET at PCP's office in 2 weeks. - Needs to weigh daily and limit fast food and keep fluids to <2 L/day. - Off dig with improvement in EF.  - Continue Entresto 97/103 mg bid.   - Continue spironolactone 50 mg daily (BP control). BMET today. - Continue Farxiga 10 mg daily. - Continue Coreg 25 mg bid.  - EF has improved, now > 35%.  Not ICD candidate.  2. LV thrombus: On warfarin, INR followed at [facility] in [place]. No thrombus seen on recent echo 9/22.  3. CVA: Cardioembolic from LV thrombus.  Seems to have minimal residual symptoms (mild tingling left face). - Continue warfarin.  4. Methamphetamine abuse: Has stopped and is in a rehab program.  - Strongly urged him not to relapse.  5. HTN: Well-controlled. Continue current regimen.   Followup in 3-4 months with Dr. Aundra Dubin.   Joel [name] 06/27/2021

## 2021-06-27 ENCOUNTER — Encounter (HOSPITAL_COMMUNITY): Payer: Self-pay

## 2021-06-27 ENCOUNTER — Ambulatory Visit (HOSPITAL_COMMUNITY)
Admission: RE | Admit: 2021-06-27 | Discharge: 2021-06-27 | Disposition: A | Discharge status: Home / Self Care | Payer: BC Managed Care – PPO | Source: Ambulatory Visit | Attending: Family Medicine | Admitting: Family Medicine

## 2021-06-27 ENCOUNTER — Other Ambulatory Visit: Payer: Self-pay

## 2021-06-27 ENCOUNTER — Ambulatory Visit (INDEPENDENT_AMBULATORY_CARE_PROVIDER_SITE_OTHER): Payer: BC Managed Care – PPO

## 2021-06-27 VITALS — BP 104/68 | HR 84 | Wt 316.6 lb

## 2021-06-27 DIAGNOSIS — Z79899 Other long term (current) drug therapy: Secondary | ICD-10-CM | POA: Insufficient documentation

## 2021-06-27 DIAGNOSIS — F419 Anxiety disorder, unspecified: Secondary | ICD-10-CM | POA: Diagnosis not present

## 2021-06-27 DIAGNOSIS — I428 Other cardiomyopathies: Secondary | ICD-10-CM | POA: Diagnosis not present

## 2021-06-27 DIAGNOSIS — I6340 Cerebral infarction due to embolism of unspecified cerebral artery: Secondary | ICD-10-CM

## 2021-06-27 DIAGNOSIS — Z7901 Long term (current) use of anticoagulants: Secondary | ICD-10-CM | POA: Insufficient documentation

## 2021-06-27 DIAGNOSIS — I5042 Chronic combined systolic (congestive) and diastolic (congestive) heart failure: Secondary | ICD-10-CM | POA: Diagnosis not present

## 2021-06-27 DIAGNOSIS — Z8616 Personal history of COVID-19: Secondary | ICD-10-CM | POA: Diagnosis not present

## 2021-06-27 DIAGNOSIS — Z5181 Encounter for therapeutic drug level monitoring: Secondary | ICD-10-CM | POA: Diagnosis not present

## 2021-06-27 DIAGNOSIS — I240 Acute coronary thrombosis not resulting in myocardial infarction: Secondary | ICD-10-CM

## 2021-06-27 DIAGNOSIS — I110 Hypertensive heart disease with heart failure: Secondary | ICD-10-CM | POA: Diagnosis not present

## 2021-06-27 DIAGNOSIS — Z8673 Personal history of transient ischemic attack (TIA), and cerebral infarction without residual deficits: Secondary | ICD-10-CM | POA: Diagnosis not present

## 2021-06-27 DIAGNOSIS — E669 Obesity, unspecified: Secondary | ICD-10-CM | POA: Insufficient documentation

## 2021-06-27 DIAGNOSIS — Z6841 Body Mass Index (BMI) 40.0 and over, adult: Secondary | ICD-10-CM | POA: Diagnosis not present

## 2021-06-27 DIAGNOSIS — F1511 Other stimulant abuse, in remission: Secondary | ICD-10-CM

## 2021-06-27 DIAGNOSIS — Z7984 Long term (current) use of oral hypoglycemic drugs: Secondary | ICD-10-CM | POA: Diagnosis not present

## 2021-06-27 DIAGNOSIS — I5022 Chronic systolic (congestive) heart failure: Secondary | ICD-10-CM | POA: Insufficient documentation

## 2021-06-27 DIAGNOSIS — Z8701 Personal history of pneumonia (recurrent): Secondary | ICD-10-CM | POA: Diagnosis not present

## 2021-06-27 DIAGNOSIS — I10 Essential (primary) hypertension: Secondary | ICD-10-CM

## 2021-06-27 LAB — BASIC METABOLIC PANEL
Anion gap: 8 (ref 5–15)
BUN: 16 mg/dL (ref 6–20)
CO2: 22 mmol/L (ref 22–32)
Calcium: 9.2 mg/dL (ref 8.9–10.3)
Chloride: 105 mmol/L (ref 98–111)
Creatinine, Ser: 0.83 mg/dL (ref 0.61–1.24)
GFR, Estimated: >60 mL/min (ref >60)
Glucose, Bld: 121 mg/dL — ABNORMAL HIGH (ref 70–99)
Potassium: 3.9 mmol/L (ref 3.5–5.1)
Sodium: 135 mmol/L (ref 135–145)

## 2021-06-27 LAB — BRAIN NATRIURETIC PEPTIDE: B Natriuretic Peptide: 11.9 pg/mL (ref 0.0–100.0)

## 2021-06-27 LAB — POCT INR: INR: 2.2 (ref 2.0–3.0)

## 2021-06-27 MED ORDER — FUROSEMIDE 20 MG PO TABS: ORAL_TABLET | ORAL | 11 refills | Status: DC

## 2021-06-27 MED ORDER — POTASSIUM CHLORIDE CRYS ER 20 MEQ PO TBCR: EXTENDED_RELEASE_TABLET | ORAL | 3 refills | Status: DC

## 2021-06-27 NOTE — Patient Instructions (Signed)
Description   Take extra 0.5 tablet today and then continue taking 1.5 tablets daily except 1 tablet on Sunday. Recheck INR in 3 weeks. Coumadin Clinic: [phone]

## 2021-06-27 NOTE — Patient Instructions (Signed)
START Lasix 40 mg daily for 5 days then as needed for swelling/weight gain START Potassium 20 meq daily for 5 days then as needed with every dose of Lasix  Labs today We will only contact you if something comes back abnormal or we need to make some changes. Otherwise no news is good news!  Labs needed in 7-14 days  Your physician recommends that you schedule a follow-up appointment in: 4 months with Dr [name]  Do the following things EVERYDAY: Weigh yourself in the morning before breakfast. Write it down and keep it in a log. Take your medicines as prescribed Eat low salt foods--Limit salt (sodium) to 2000 mg per day.  Stay as active as you can everyday Limit all fluids for the day to less than 2 liters  At the Advanced Heart Failure Clinic, you and your health needs are our priority. As part of our continuing mission to provide you with exceptional heart care, we have created designated Provider Care Teams. These Care Teams include your primary Cardiologist (physician) and Advanced Practice Providers (APPs- Physician Assistants and Nurse Practitioners) who all work together to provide you with the care you need, when you need it.   You may see any of the following providers on your designated Care Team at your next follow up: Dr Arvilla Meres Dr Carron Curie, NP Robbie Lis, [place] [facility] [place], [place] Karle Plumber, PharmD   Please be sure to bring in all your medications bottles to every appointment. .  If you have any questions or concerns before your next appointment please send [place] a message through [place] or call our office at [phone].    TO LEAVE A MESSAGE FOR THE NURSE SELECT OPTION 2, PLEASE LEAVE A MESSAGE INCLUDING: YOUR NAME DATE OF BIRTH CALL BACK NUMBER REASON FOR CALL**this is important as we prioritize the call backs  YOU WILL RECEIVE A CALL BACK THE SAME DAY AS LONG AS YOU CALL BEFORE 4:00 PM

## 2021-06-27 NOTE — Progress Notes (Signed)
ReDS Vest / Clip - 06/27/21 1400       ReDS Vest / Clip   Station Marker D    Ruler Value 47    ReDS Value Range Moderate volume overload    ReDS Actual Value 37

## 2021-07-07 ENCOUNTER — Other Ambulatory Visit: Payer: BC Managed Care – PPO

## 2021-07-12 ENCOUNTER — Other Ambulatory Visit (HOSPITAL_COMMUNITY): Payer: Self-pay | Admitting: Family Medicine

## 2021-07-12 ENCOUNTER — Other Ambulatory Visit: Payer: BC Managed Care – PPO

## 2021-07-12 DIAGNOSIS — I5042 Chronic combined systolic (congestive) and diastolic (congestive) heart failure: Secondary | ICD-10-CM | POA: Diagnosis not present

## 2021-07-13 ENCOUNTER — Telehealth (HOSPITAL_COMMUNITY): Payer: Self-pay

## 2021-07-13 LAB — BASIC METABOLIC PANEL WITH GFR
BUN/Creatinine Ratio: 18 (ref 9–20)
BUN: 14 mg/dL (ref 6–20)
CO2: 22 mmol/L (ref 20–29)
Calcium: 9.6 mg/dL (ref 8.7–10.2)
Chloride: 99 mmol/L (ref 96–106)
Creatinine, Ser: 0.77 mg/dL (ref 0.76–1.27)
Glucose: 159 mg/dL — ABNORMAL HIGH (ref 70–99)
Potassium: 3.8 mmol/L (ref 3.5–5.2)
Sodium: 139 mmol/L (ref 134–144)
eGFR: 129 mL/min/1.73 (ref >59)

## 2021-07-13 MED ORDER — CARVEDILOL 25 MG PO TABS: 25.0000 mg | ORAL_TABLET | Freq: Two times a day (BID) | ORAL | 3 refills | Status: DC

## 2021-07-13 NOTE — Telephone Encounter (Signed)
Pt. Called to inform needing refills. Refills sent into pharmacy.

## 2021-07-18 ENCOUNTER — Ambulatory Visit (INDEPENDENT_AMBULATORY_CARE_PROVIDER_SITE_OTHER): Payer: BC Managed Care – PPO

## 2021-07-18 ENCOUNTER — Other Ambulatory Visit: Payer: Self-pay

## 2021-07-18 DIAGNOSIS — Z5181 Encounter for therapeutic drug level monitoring: Secondary | ICD-10-CM

## 2021-07-18 DIAGNOSIS — I6340 Cerebral infarction due to embolism of unspecified cerebral artery: Secondary | ICD-10-CM

## 2021-07-18 DIAGNOSIS — I240 Acute coronary thrombosis not resulting in myocardial infarction: Secondary | ICD-10-CM | POA: Diagnosis not present

## 2021-07-18 LAB — POCT INR: INR: 1.6 — AB (ref 2.0–3.0)

## 2021-07-18 NOTE — Patient Instructions (Signed)
Description   Take extra 0.5 tablet today and then continue taking 1.5 tablets daily except 1 tablet on Sunday. Recheck INR in 2 weeks. Coumadin Clinic: [phone]

## 2021-08-01 ENCOUNTER — Other Ambulatory Visit: Payer: Self-pay

## 2021-08-01 ENCOUNTER — Ambulatory Visit (INDEPENDENT_AMBULATORY_CARE_PROVIDER_SITE_OTHER): Payer: BC Managed Care – PPO

## 2021-08-01 DIAGNOSIS — I6340 Cerebral infarction due to embolism of unspecified cerebral artery: Secondary | ICD-10-CM | POA: Diagnosis not present

## 2021-08-01 DIAGNOSIS — I240 Acute coronary thrombosis not resulting in myocardial infarction: Secondary | ICD-10-CM

## 2021-08-01 DIAGNOSIS — Z5181 Encounter for therapeutic drug level monitoring: Secondary | ICD-10-CM | POA: Diagnosis not present

## 2021-08-01 LAB — POCT INR: INR: 1.3 — AB (ref 2.0–3.0)

## 2021-08-01 NOTE — Patient Instructions (Signed)
Description   START taking 1.5 tablets daily. Recheck INR in 1 week. Coumadin Clinic: [phone]

## 2021-08-08 ENCOUNTER — Other Ambulatory Visit: Payer: Self-pay

## 2021-08-08 ENCOUNTER — Ambulatory Visit (INDEPENDENT_AMBULATORY_CARE_PROVIDER_SITE_OTHER): Payer: BC Managed Care – PPO

## 2021-08-08 DIAGNOSIS — Z5181 Encounter for therapeutic drug level monitoring: Secondary | ICD-10-CM

## 2021-08-08 DIAGNOSIS — I240 Acute coronary thrombosis not resulting in myocardial infarction: Secondary | ICD-10-CM

## 2021-08-08 DIAGNOSIS — I6340 Cerebral infarction due to embolism of unspecified cerebral artery: Secondary | ICD-10-CM | POA: Diagnosis not present

## 2021-08-08 LAB — POCT INR: INR: 2.4 (ref 2.0–3.0)

## 2021-08-08 NOTE — Patient Instructions (Signed)
Description   Continue taking 1.5 tablets daily. Recheck INR in 3 weeks. Coumadin Clinic: [phone]

## 2021-08-11 DIAGNOSIS — F1520 Other stimulant dependence, uncomplicated: Secondary | ICD-10-CM | POA: Diagnosis not present

## 2021-08-29 ENCOUNTER — Ambulatory Visit (INDEPENDENT_AMBULATORY_CARE_PROVIDER_SITE_OTHER): Payer: BC Managed Care – PPO

## 2021-08-29 ENCOUNTER — Other Ambulatory Visit: Payer: Self-pay

## 2021-08-29 DIAGNOSIS — Z5181 Encounter for therapeutic drug level monitoring: Secondary | ICD-10-CM | POA: Diagnosis not present

## 2021-08-29 DIAGNOSIS — I6340 Cerebral infarction due to embolism of unspecified cerebral artery: Secondary | ICD-10-CM

## 2021-08-29 DIAGNOSIS — I240 Acute coronary thrombosis not resulting in myocardial infarction: Secondary | ICD-10-CM

## 2021-08-29 LAB — POCT INR: INR: 5 — AB (ref 2.0–3.0)

## 2021-08-29 NOTE — Patient Instructions (Signed)
Description   Hold today's dose and tomorrow's dose and then continue taking 1.5 tablets daily. Recheck INR in 1 week. Coumadin Clinic: [phone]

## 2021-09-05 ENCOUNTER — Other Ambulatory Visit: Payer: Self-pay

## 2021-09-05 ENCOUNTER — Ambulatory Visit (INDEPENDENT_AMBULATORY_CARE_PROVIDER_SITE_OTHER): Payer: BC Managed Care – PPO

## 2021-09-05 DIAGNOSIS — I6340 Cerebral infarction due to embolism of unspecified cerebral artery: Secondary | ICD-10-CM

## 2021-09-05 DIAGNOSIS — I240 Acute coronary thrombosis not resulting in myocardial infarction: Secondary | ICD-10-CM

## 2021-09-05 DIAGNOSIS — Z5181 Encounter for therapeutic drug level monitoring: Secondary | ICD-10-CM | POA: Diagnosis not present

## 2021-09-05 LAB — POCT INR: INR: 2.5 (ref 2.0–3.0)

## 2021-09-05 NOTE — Patient Instructions (Signed)
Description   ?Continue taking 1.5 tablets daily. ?Recheck INR in 3 weeks. Coumadin Clinic: [phone] ? ? ?  ?   ?

## 2021-09-26 ENCOUNTER — Other Ambulatory Visit: Payer: Self-pay

## 2021-09-26 ENCOUNTER — Ambulatory Visit (INDEPENDENT_AMBULATORY_CARE_PROVIDER_SITE_OTHER): Payer: BC Managed Care – PPO

## 2021-09-26 ENCOUNTER — Other Ambulatory Visit: Payer: Self-pay | Admitting: Family Medicine

## 2021-09-26 DIAGNOSIS — I6340 Cerebral infarction due to embolism of unspecified cerebral artery: Secondary | ICD-10-CM

## 2021-09-26 DIAGNOSIS — Z5181 Encounter for therapeutic drug level monitoring: Secondary | ICD-10-CM

## 2021-09-26 DIAGNOSIS — I240 Acute coronary thrombosis not resulting in myocardial infarction: Secondary | ICD-10-CM | POA: Diagnosis not present

## 2021-09-26 LAB — POCT INR: INR: 1.2 — AB (ref 2.0–3.0)

## 2021-09-26 MED ORDER — HYDROXYZINE HCL 50 MG PO TABS: 100.0000 mg | ORAL_TABLET | Freq: Every evening | ORAL | 0 refills | Status: DC | PRN

## 2021-09-26 NOTE — Patient Instructions (Signed)
Description   ?Take 2 tablets and 2 tablets tomorrow and then continue taking 1.5 tablets daily. ?Recheck INR in 1 week. [facility]: [phone] ? ? ?  ?   ?

## 2021-10-03 ENCOUNTER — Ambulatory Visit (INDEPENDENT_AMBULATORY_CARE_PROVIDER_SITE_OTHER): Payer: BC Managed Care – PPO

## 2021-10-03 DIAGNOSIS — I6340 Cerebral infarction due to embolism of unspecified cerebral artery: Secondary | ICD-10-CM | POA: Diagnosis not present

## 2021-10-03 DIAGNOSIS — Z5181 Encounter for therapeutic drug level monitoring: Secondary | ICD-10-CM | POA: Diagnosis not present

## 2021-10-03 DIAGNOSIS — I240 Acute coronary thrombosis not resulting in myocardial infarction: Secondary | ICD-10-CM | POA: Diagnosis not present

## 2021-10-03 LAB — POCT INR: INR: 4.6 — AB (ref 2.0–3.0)

## 2021-10-03 NOTE — Patient Instructions (Signed)
Description   ?Hold today's dose and eat greens and then continue taking 1.5 tablets daily. ?Recheck INR in 1 week. [facility]: [phone] ? ? ?  ?   ?

## 2021-10-11 ENCOUNTER — Telehealth: Payer: Self-pay

## 2021-10-11 NOTE — Telephone Encounter (Signed)
Lpmtcb and reschedule INR check 

## 2021-10-17 ENCOUNTER — Ambulatory Visit (INDEPENDENT_AMBULATORY_CARE_PROVIDER_SITE_OTHER): Payer: BC Managed Care – PPO

## 2021-10-17 DIAGNOSIS — Z5181 Encounter for therapeutic drug level monitoring: Secondary | ICD-10-CM

## 2021-10-17 DIAGNOSIS — I240 Acute coronary thrombosis not resulting in myocardial infarction: Secondary | ICD-10-CM

## 2021-10-17 DIAGNOSIS — I6340 Cerebral infarction due to embolism of unspecified cerebral artery: Secondary | ICD-10-CM

## 2021-10-17 LAB — POCT INR: INR: 4.1 — AB (ref 2.0–3.0)

## 2021-10-17 NOTE — Patient Instructions (Signed)
Description   ?Hold tomorrow's dose and eat greens and then continue taking 1.5 tablets daily. ?Recheck INR in 1 week.  ?[facility]in Clinic: [phone] ? ? ?  ?   ?

## 2021-10-24 ENCOUNTER — Telehealth: Payer: Self-pay

## 2021-10-24 NOTE — Telephone Encounter (Signed)
Pt missed appt. Called and rescheduled appt.  ?

## 2021-10-30 ENCOUNTER — Telehealth (HOSPITAL_COMMUNITY): Payer: Self-pay

## 2021-10-30 NOTE — Telephone Encounter (Signed)
Called to confirm/remind patient of their appointment at the [facility] on 10/31/21.  ? ?Patient reminded to bring all medications and/or complete list. ? ?Confirmed patient has transportation. Gave directions, instructed to utilize [place] parking. ? ?Confirmed appointment prior to ending call.  ? ?

## 2021-10-30 NOTE — Progress Notes (Signed)
PCP: Blane Ohara, MD  ?Cardiology: Dr. [name] ? ?Joel [name] is 26 y.o. male with a hx of hypertension, anxiety and prior COVID-19 infection in June in 2021 who presented to [facility] in 10/21 with shortness of breath and hemoptysis x 1 month.  An echocardiogram demonstrated EF < 20% and an LV thrombus.  A CT scan demonstrated multifocal pneumonia, no PE.  He was started on furosemide and Lovenox and transferred to [facility] for further evaluation and management.  He was started on milrinone with low output by co-ox and diuresed. Cardiac MRI showed severe LV dilation with diffuse hypokinesis, EF 15%, small LV apical thrombus, moderate RV dilation with EF 19%, LGE pattern concerning for myocarditis with diffuse patchy mid-wall LGE.  He was treated with ceftriaxone and doxycycline for PNA. He had a CVA with left-sided weakness and left facial numbness.  MRI showed multifocal bilateral infarcts consistent with cardio-embolism.  He was transitioned from Lovenox/warfarin to IV heparin gtt/warfarin.  Left-sided weakness resolved. He was titrated onto cardiac meds and eventually discharged home.  ? ?Echo in 3/22 showed EF 35% with global hypokinesis, mild LV dilation, mildly decreased RV systolic function. No LV thrombus.    ? ?Echo 9/22 showed EF 45% with mild LVH, diffuse hypokinesis, normal RV, no thrombus.  ? ?Today se returns for HF follow up with his grandfather. Overall feeling fine. No dyspnea with activity or work duties. 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 Denies abnormal bleeding, palpitaitons, CP, dizziness, edema, or PND/Orthopnea. Appetite ok. No fever or chills. Weight at home 320 pounds. Taking all medications. Eats fast food frequently, no ETOH or drug use. Vapes daily. ? ?ReDs: 37% ? ?ECG (personally reviewed): NSR 75 bpm ? ?Labs (10/21): K 4.9, creatinine 1.06 ?Labs (11/21): K 5, creatinine 0.96 ?Labs (12/21): digoxin 0.7, LDL 80, HDL 50, K 4.3, creatinine 8.28 ?Labs  (7/22): K 4.7, creatinine 1.11 ?Labs (10/22): K 4.4, creatinine 0.80 ?Labs (1/23): K 3.8, creatinine 0.77 ? ?PMH: ?1. H/o COVID-19 PNA 6/21.  ?2. HTN ?3. Anxiety ?4. Chronic systolic CHF: Nonischemic cardiomyopathy.   ?- Echo (10/21): EF < 20%, LV thrombus.  ?- Cardiac MRI (10/21): Severe LV dilation with diffuse hypokinesis, EF 15%, small LV apical thrombus, moderate RV dilation with EF 19%, LGE pattern concerning for myocarditis, with diffuse patchy mid-wall LGE. ?- Echo (3/22): EF 35% with global hypokinesis, mild LV dilation, mildly decreased RV systolic function. No LV thrombus. ?- Echo (9/22): EF 45% with mild LVH, diffuse hypokinesis, normal RV, no thrombus.  ?5. CVA: 10/21, suspect cardio-embolic.  ?6. LV apical thrombus ?7. Sleep study negative.  ?8. Crystal meth abuse ? ?Social History  ? ?Socioeconomic History  ? Marital status: Single  ?  Spouse name: Not on file  ? Number of children: Not on file  ? Years of education: Not on file  ? Highest education level: Not on file  ?Occupational History  ? Occupation: Full time/Plumbing  ?Tobacco Use  ? Smoking status: Former  ?  Types: E-cigarettes  ? Smokeless tobacco: Current  ?  Types: Chew  ? Tobacco comments:  ?  quit around June 2021  ?Vaping Use  ? Vaping Use: Former  ? Substances: Nicotine  ?Substance and Sexual Activity  ? Alcohol use: Not Currently  ?  Comment: Patient used to drink occasionally a beer, but stopped when started lexapro  ? Drug use: Never  ? Sexual activity: Yes  ?  Birth control/protection: Condom  ?Other Topics  Concern  ? Not on file  ?Social History Narrative  ? Lives with grandmother, grandfather and sister  ? Right Handed  ? Drinks rarely caffeine  ? ?Social Determinants of Health  ? ?Financial Resource Strain: Not on file  ?Food Insecurity: Not on file  ?Transportation Needs: Not on file  ?Physical Activity: Not on file  ?Stress: Not on file  ?Social Connections: Not on file  ?Intimate Partner Violence: Not on file  ? ?Family  History  ?Problem Relation Age of Onset  ? Diabetes Mother   ? Diabetes Paternal Grandfather   ? Heart failure Neg Hx   ? ?ROS: All systems reviewed and negative except as per HPI.  ? ?Current Outpatient Medications  ?Medication Sig Dispense Refill  ? atorvastatin (LIPITOR) 10 MG tablet TAKE ONE TABLET BY MOUTH EVERY DAY 30 tablet 5  ? carvedilol (COREG) 25 MG tablet Take 1 tablet (25 mg total) by mouth 2 (two) times daily with a meal. 180 tablet 3  ? dapagliflozin propanediol (FARXIGA) 10 MG TABS tablet TAKE 1 TABLET (10 MG TOTAL) BY MOUTH DAILY. 90 tablet 0  ? fexofenadine (ALLEGRA) 60 MG tablet Take 60 mg by mouth daily as needed for allergies or rhinitis.    ? furosemide (LASIX) 20 MG tablet Take 20 mg by mouth as needed.    ? hydrOXYzine (ATARAX) 50 MG tablet Take 2 tablets (100 mg total) by mouth at bedtime as needed for anxiety. 120 tablet 0  ? montelukast (SINGULAIR) 10 MG tablet Take 10 mg by mouth at bedtime.    ? potassium chloride SA (KLOR-CON M) 20 MEQ tablet Take 20 mEq by mouth as needed.    ? sacubitril-valsartan (ENTRESTO) 97-103 MG Take 1 tablet by mouth 2 (two) times daily. 180 tablet 3  ? spironolactone (ALDACTONE) 50 MG tablet Take 1 tablet (50 mg total) by mouth daily. 90 tablet 3  ? warfarin (COUMADIN) 5 MG tablet daily. Patient takes 1 and 1/2 every day except Sundays and takes 1 tablet on Sundays.    ? ?No current facility-administered medications for this encounter.  ? ?Wt Readings from Last 3 Encounters:  ?10/31/21 (!) 146.5 kg (323 lb)  ?06/27/21 (!) 143.6 kg (316 lb 9.6 oz)  ?03/30/21 132.5 kg (292 lb)  ? ?BP 126/82   Pulse 61   Wt (!) 146.5 kg (323 lb)   SpO2 98%   BMI 42.61 kg/m?  ?Physical Exam: ?General:  NAD. No resp difficulty ?HEENT: Normal ?Neck: Supple. Thick neck. Carotids 2+ bilat; no bruits. No lymphadenopathy or thryomegaly appreciated. ?Cor: PMI nondisplaced. Regular rate & rhythm. No rubs, gallops or murmurs. ?Lungs: Clear ?Abdomen: Obese, nontender, nondistended. No  hepatosplenomegaly. No bruits or masses. Good bowel sounds. ?Extremities: No cyanosis, clubbing, rash, trace BLE edema ?Neuro: Alert & oriented x 3, cranial nerves grossly intact. Moves all 4 extremities w/o difficulty. Affect pleasant. ? ?Assessment/Plan: ?1. Chronic systolic CHF: Appears to be nonischemic cardiomyopathy.  Echo in 10/21 at [facility] with EF < 20%, LV thrombus noted.  When he presented in 10/21, he had had dyspnea, orthopnea, and pleuritic chest pain since he had COVID-19 in 6/21. He feels like he never improved.  No family history of cardiomyopathy or premature CAD.  HIV negative. CMRI in 10/21 with LVEF 15%, RVEF 19%, LGE pattern concerning for myocarditis. Suspect most likely etiology for cardiomyopathy is viral myocarditis due to COVID-19 though methamphetamine use may have contributed as well.  Echo in 3/22 showed improved EF to 35%. Echo 9/22 showed  EF up to 45%. NYHA class I. Exam difficult for volume, but weight up 7 lbs and ReDs 37%. Increased weight is likely in part caloric + CHF.   ?- Start Lasix 20 mg daily. BMET/BNP today, repeat BMET in 10 days at PCP office. ?- Needs to weigh daily and limit fast food. ?- Continue Entresto 97/103 mg bid.   ?- Continue spironolactone 50 mg daily (BP control).  ?- Continue Farxiga 10 mg daily. ?- Continue Coreg 25 mg bid.  ?- EF has improved, now > 35%.  Not ICD candidate.  ?2. LV thrombus: On warfarin, INR followed at [facility] in [place]. No thrombus seen on echo 9/22.  ?- Check INR today and will forward to Coumadin Clinic. ?3. CVA: Cardioembolic from LV thrombus.  Seems to have minimal residual symptoms (mild, occasional tingling left face). ?- Continue warfarin.  ?4. Methamphetamine abuse: Has stopped and completed rehab program.  ?- Strongly urged him not to relapse.  ?5. HTN: Well-controlled. Continue current regimen.  ?6. Obesity: Body mass index is 42.61 kg/m?Marland Kitchen We discussed dietary changes to help with weight loss. ?- Refer to pharmacy  for [facility]. ?- Check A1c today. ?7. Vaping: Encouraged cessation. ? ?Followup in 4 months with Dr. [name].  ? ?Jacklynn Ganong FNP ?10/31/2021 ?

## 2021-10-31 ENCOUNTER — Telehealth: Payer: Self-pay | Admitting: Pharmacist

## 2021-10-31 ENCOUNTER — Ambulatory Visit (HOSPITAL_COMMUNITY)
Admission: RE | Admit: 2021-10-31 | Discharge: 2021-10-31 | Disposition: A | Discharge status: Home / Self Care | Payer: BC Managed Care – PPO | Source: Ambulatory Visit | Attending: Family Medicine | Admitting: Family Medicine

## 2021-10-31 ENCOUNTER — Encounter (HOSPITAL_COMMUNITY): Payer: Self-pay

## 2021-10-31 ENCOUNTER — Ambulatory Visit (INDEPENDENT_AMBULATORY_CARE_PROVIDER_SITE_OTHER): Payer: BC Managed Care – PPO

## 2021-10-31 VITALS — BP 126/82 | HR 61 | Wt 323.0 lb

## 2021-10-31 DIAGNOSIS — Z79899 Other long term (current) drug therapy: Secondary | ICD-10-CM | POA: Diagnosis not present

## 2021-10-31 DIAGNOSIS — I8290 Acute embolism and thrombosis of unspecified vein: Secondary | ICD-10-CM | POA: Diagnosis not present

## 2021-10-31 DIAGNOSIS — I110 Hypertensive heart disease with heart failure: Secondary | ICD-10-CM | POA: Diagnosis not present

## 2021-10-31 DIAGNOSIS — F419 Anxiety disorder, unspecified: Secondary | ICD-10-CM | POA: Insufficient documentation

## 2021-10-31 DIAGNOSIS — Z6841 Body Mass Index (BMI) 40.0 and over, adult: Secondary | ICD-10-CM | POA: Insufficient documentation

## 2021-10-31 DIAGNOSIS — I240 Acute coronary thrombosis not resulting in myocardial infarction: Secondary | ICD-10-CM

## 2021-10-31 DIAGNOSIS — Z8673 Personal history of transient ischemic attack (TIA), and cerebral infarction without residual deficits: Secondary | ICD-10-CM | POA: Diagnosis not present

## 2021-10-31 DIAGNOSIS — E669 Obesity, unspecified: Secondary | ICD-10-CM | POA: Diagnosis not present

## 2021-10-31 DIAGNOSIS — I5021 Acute systolic (congestive) heart failure: Secondary | ICD-10-CM

## 2021-10-31 DIAGNOSIS — F17290 Nicotine dependence, other tobacco product, uncomplicated: Secondary | ICD-10-CM

## 2021-10-31 DIAGNOSIS — I6340 Cerebral infarction due to embolism of unspecified cerebral artery: Secondary | ICD-10-CM | POA: Diagnosis not present

## 2021-10-31 DIAGNOSIS — Z5181 Encounter for therapeutic drug level monitoring: Secondary | ICD-10-CM | POA: Diagnosis not present

## 2021-10-31 DIAGNOSIS — I428 Other cardiomyopathies: Secondary | ICD-10-CM | POA: Insufficient documentation

## 2021-10-31 DIAGNOSIS — Z8616 Personal history of COVID-19: Secondary | ICD-10-CM | POA: Insufficient documentation

## 2021-10-31 DIAGNOSIS — F1511 Other stimulant abuse, in remission: Secondary | ICD-10-CM

## 2021-10-31 DIAGNOSIS — I10 Essential (primary) hypertension: Secondary | ICD-10-CM

## 2021-10-31 DIAGNOSIS — F1510 Other stimulant abuse, uncomplicated: Secondary | ICD-10-CM | POA: Insufficient documentation

## 2021-10-31 DIAGNOSIS — I5022 Chronic systolic (congestive) heart failure: Secondary | ICD-10-CM | POA: Insufficient documentation

## 2021-10-31 DIAGNOSIS — Z7901 Long term (current) use of anticoagulants: Secondary | ICD-10-CM | POA: Diagnosis not present

## 2021-10-31 LAB — BASIC METABOLIC PANEL
Anion gap: 6 (ref 5–15)
BUN: 12 mg/dL (ref 6–20)
CO2: 23 mmol/L (ref 22–32)
Calcium: 9.3 mg/dL (ref 8.9–10.3)
Chloride: 110 mmol/L (ref 98–111)
Creatinine, Ser: 1.02 mg/dL (ref 0.61–1.24)
GFR, Estimated: >60 mL/min (ref >60)
Glucose, Bld: 109 mg/dL — ABNORMAL HIGH (ref 70–99)
Potassium: 4.1 mmol/L (ref 3.5–5.1)
Sodium: 139 mmol/L (ref 135–145)

## 2021-10-31 LAB — PROTIME-INR
INR: 1.2 (ref 0.8–1.2)
Prothrombin Time: 15.1 seconds (ref 11.4–15.2)

## 2021-10-31 LAB — HEMOGLOBIN A1C
Hgb A1c MFr Bld: 5.2 % (ref 4.8–5.6)
Mean Plasma Glucose: 102.54 mg/dL

## 2021-10-31 MED ORDER — FUROSEMIDE 20 MG PO TABS: 20.0000 mg | ORAL_TABLET | Freq: Every day | ORAL | 6 refills | Status: DC

## 2021-10-31 NOTE — Patient Instructions (Signed)
Description   ?Called pt and instucted to take 2 tablets today and 2 tablets tomorrow and then continue taking 1.5 tablets daily. ?Recheck INR in 1 week.  ?Coumadin Clinic: [phone] ? ? ?  ?   ?

## 2021-10-31 NOTE — Patient Instructions (Signed)
Thank you for coming in today ? ?Labs were done today, if any labs are abnormal the clinic will call you ?No news is good news ? ?Your physician recommends that you schedule a follow-up appointment in:  ?4 months with Dr. [name] ? ?Your physician recommends that you return for lab work in: ?BMET in 10 days at Dr. Sedalia Muta office  ? ?Take Lasix 20 mg 1 tablet daily  ? ?You have been referred to pharmacy for Ozempic they will call for further details  ? ?At the [facility], you and your health needs are our priority. As part of our continuing mission to provide you with exceptional heart care, we have created designated Provider Care Teams. These Care Teams include your primary Cardiologist (physician) and Advanced Practice Providers (APPs- Physician Assistants and Nurse Practitioners) who all work together to provide you with the care you need, when you need it.  ? ?You may see any of the following providers on your designated Care Team at your next follow up: ?Dr Arvilla Meres ?Dr [name] ?Tonye Becket, NP ?Robbie Lis, PA ?Jessica Milford,NP ?Anna Genre, PA ?Karle Plumber, PharmD ? ? ?Please be sure to bring in all your medications bottles to every appointment.  ? ?If you have any questions or concerns before your next appointment please send [place] a message through [place] or call our office at [phone].   ? ?TO LEAVE A MESSAGE FOR THE NURSE SELECT OPTION 2, PLEASE LEAVE A MESSAGE INCLUDING: ?YOUR NAME ?DATE OF BIRTH ?CALL BACK NUMBER ?REASON FOR CALL**this is important as we prioritize the call backs ? ?YOU WILL RECEIVE A CALL BACK THE SAME DAY AS LONG AS YOU CALL BEFORE 4:00 PM ? ?

## 2021-10-31 NOTE — Telephone Encounter (Addendum)
Received referral for GLP-1 for weight loss. Joel [name] is excluded from patients benefits.Patient does not have DM. Ozempic requires a PA, therefore will not be covered for off label use. ? ?LVM and message on home phone to call back. ?

## 2021-10-31 NOTE — Progress Notes (Signed)
?  Reds 37 % ?Station [address] ?Ruler 47  ?

## 2021-11-03 NOTE — Telephone Encounter (Signed)
Spoke with patient. Advised that his insurance will not cover Wegovy or Ozempic. I did offer him an apt to discuss nutrition and exercise but patient declined. ?

## 2021-11-10 ENCOUNTER — Other Ambulatory Visit: Payer: BC Managed Care – PPO

## 2021-11-10 ENCOUNTER — Telehealth: Payer: Self-pay

## 2021-11-10 DIAGNOSIS — I5021 Acute systolic (congestive) heart failure: Secondary | ICD-10-CM

## 2021-11-10 NOTE — Telephone Encounter (Signed)
Pt overdue for INR to be checked. Called pt, no answer. Unable to leave voicemail.  ?

## 2021-11-11 LAB — SPECIMEN STATUS REPORT

## 2021-11-11 LAB — BASIC METABOLIC PANEL
BUN/Creatinine Ratio: 14 (ref 9–20)
BUN: 12 mg/dL (ref 6–20)
CO2: 17 mmol/L — ABNORMAL LOW (ref 20–29)
Calcium: 9.3 mg/dL (ref 8.7–10.2)
Chloride: 104 mmol/L (ref 96–106)
Creatinine, Ser: 0.85 mg/dL (ref 0.76–1.27)
Glucose: 110 mg/dL — ABNORMAL HIGH (ref 70–99)
Potassium: 4.7 mmol/L (ref 3.5–5.2)
Sodium: 137 mmol/L (ref 134–144)
eGFR: 125 mL/min/{1.73_m2} (ref >59)

## 2021-12-12 ENCOUNTER — Telehealth: Payer: Self-pay

## 2021-12-12 NOTE — Telephone Encounter (Signed)
INR overdue. Called, no answer. Voice mailbox full and unable to leave voicemail.

## 2022-01-04 ENCOUNTER — Other Ambulatory Visit: Payer: Self-pay

## 2022-01-04 MED ORDER — HYDROXYZINE HCL 50 MG PO TABS: 100.0000 mg | ORAL_TABLET | Freq: Every evening | ORAL | 0 refills | Status: DC | PRN

## 2022-01-09 ENCOUNTER — Telehealth: Payer: Self-pay

## 2022-01-09 NOTE — Telephone Encounter (Signed)
Pt overdue for anticoagulation appt to have INR checked. Unable to leave voicemail on pt's mobile #. Called pt's home # and spoke with pt's family member; however, pt refused to take the phone call and hung up.

## 2022-01-29 ENCOUNTER — Encounter: Payer: Self-pay | Admitting: Family Medicine

## 2022-01-29 ENCOUNTER — Ambulatory Visit (INDEPENDENT_AMBULATORY_CARE_PROVIDER_SITE_OTHER): Payer: BC Managed Care – PPO | Admitting: Family Medicine

## 2022-01-29 VITALS — BP 136/96 | HR 88 | Temp 97.3°F | Resp 18 | Ht 71.0 in | Wt 305.2 lb

## 2022-01-29 DIAGNOSIS — I420 Dilated cardiomyopathy: Secondary | ICD-10-CM | POA: Diagnosis not present

## 2022-01-29 DIAGNOSIS — Z6841 Body Mass Index (BMI) 40.0 and over, adult: Secondary | ICD-10-CM

## 2022-01-29 DIAGNOSIS — E782 Mixed hyperlipidemia: Secondary | ICD-10-CM | POA: Insufficient documentation

## 2022-01-29 DIAGNOSIS — I5042 Chronic combined systolic (congestive) and diastolic (congestive) heart failure: Secondary | ICD-10-CM | POA: Diagnosis not present

## 2022-01-29 DIAGNOSIS — J302 Other seasonal allergic rhinitis: Secondary | ICD-10-CM | POA: Diagnosis not present

## 2022-01-29 DIAGNOSIS — Z8616 Personal history of COVID-19: Secondary | ICD-10-CM

## 2022-01-29 NOTE — Assessment & Plan Note (Signed)
The current medical regimen is effective;  continue present plan and medications. Management per specialist.   

## 2022-01-29 NOTE — Assessment & Plan Note (Signed)
Recommend continue to work on eating healthy diet and exercise.  

## 2022-01-29 NOTE — Progress Notes (Signed)
Subjective:  Patient ID: Joel [name], male    DOB: 09-15-1995  Age: 26 y.o. MRN: [id number]  Chief Complaint  Patient presents with   Anxiety   HPI:  Drug abuse: No illegal drugs x 01/21/2022. Vaping nicotine.  Generalized anxiety has worsened.  His GAD-7 score went from 7-11.  At his last visit with Flonnie Hailstone, NP, she was concerned he had ADHD.  Patient does report he is continuing to have issues with ability to pay attention.  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 [name]. Entresto, lasix 20mg  daily as needed and will take potassium chloride 20 meq once daily if takes lasix,  coreg, spirolactone, farxiga.  Hyperlipidemia: lipitor 10 mg once daily in before bed.  Goes once a month PT/INR checked. Coumadin 5 mg 1 1/2 daily. Overdue. Patient says he is going tomorrow to cardiology for PT/INR check.   Singulair and allegra for allergies.    Current Outpatient Medications on File Prior to Visit  Medication Sig Dispense Refill   atorvastatin (LIPITOR) 10 MG tablet TAKE ONE TABLET BY MOUTH EVERY DAY 30 tablet 5   carvedilol (COREG) 25 MG tablet Take 1 tablet (25 mg total) by mouth 2 (two) times daily with a meal. 180 tablet 3   dapagliflozin propanediol (FARXIGA) 10 MG TABS tablet TAKE 1 TABLET (10 MG TOTAL) BY MOUTH DAILY. 90 tablet 0   fexofenadine (ALLEGRA) 60 MG tablet Take 60 mg by mouth daily as needed for allergies or rhinitis.     furosemide (LASIX) 20 MG tablet Take 1 tablet (20 mg total) by mouth daily. 30 tablet 6   hydrOXYzine (ATARAX) 50 MG tablet Take 2 tablets (100 mg total) by mouth at bedtime as needed for anxiety. 60 tablet 0   montelukast (SINGULAIR) 10 MG tablet Take 10 mg by mouth at bedtime.     potassium chloride SA (KLOR-CON M) 20 MEQ tablet Take 20 mEq by mouth as needed.     sacubitril-valsartan (ENTRESTO) 97-103 MG Take 1 tablet by mouth 2 (two) times daily. 180 tablet 3   spironolactone (ALDACTONE) 50 MG tablet Take 1 tablet (50 mg  total) by mouth daily. 90 tablet 3   warfarin (COUMADIN) 5 MG tablet daily. Patient takes 1 and 1/2 every day except Sundays and takes 1 tablet on Sundays.     No current facility-administered medications on file prior to visit.   Past Medical History:  Diagnosis Date   Anxiety    Cerebral embolism with cerebral infarction 04/28/2020   Chronic systolic CHF 04/24/2020   Echo at [facility] 04/2020: EF < 20, mild to mod MR, LV thrombus   Depression    Dilated cardiomyopathy (HCC)    History of COVID-19    Hypertension    LV (left ventricular) mural thrombus    Past Surgical History:  Procedure Laterality Date   TONSILLECTOMY      Family History  Problem Relation Age of Onset   Diabetes Mother    Diabetes Paternal Grandfather    Heart failure Neg Hx    Social History   Socioeconomic History   Marital status: Single    Spouse name: Not on file   Number of children: Not on file   Years of education: Not on file   Highest education level: Not on file  Occupational History   Occupation: Full time/Plumbing  Tobacco Use   Smoking status: Former    Types: E-cigarettes   Smokeless tobacco: Current    Types: 05/2020  Tobacco comments:    quit around June 2021  Vaping Use   Vaping Use: Former   Substances: Nicotine  Substance and Sexual Activity   Alcohol use: Not Currently    Comment: Patient used to drink occasionally a beer, but stopped when started lexapro   Drug use: Never   Sexual activity: Yes    Birth control/protection: Condom  Other Topics Concern   Not on file  Social History Narrative   Lives with grandmother, grandfather and sister   Right Handed   Drinks rarely caffeine   Social Determinants of Health   Financial Resource Strain: Not on file  Food Insecurity: Not on file  Transportation Needs: Not on file  Physical Activity: Not on file  Stress: Not on file  Social Connections: Not on file    Review of Systems  Constitutional:  Negative for  appetite change, fatigue and fever.  HENT:  Negative for congestion, ear pain, sinus pressure and sore throat.   Respiratory:  Negative for cough, shortness of breath and wheezing.   Cardiovascular:  Negative for chest pain and palpitations.  Gastrointestinal:  Negative for abdominal pain, constipation, diarrhea, nausea and vomiting.  Genitourinary:  Negative for dysuria and frequency.  Musculoskeletal:  Negative for arthralgias, back pain, joint swelling and myalgias.  Skin:  Negative for rash.  Neurological:  Negative for dizziness, weakness and headaches.  Psychiatric/Behavioral:  Negative for dysphoric mood. The patient is not nervous/anxious.      Objective:  BP (!) 136/96   Pulse 88   Temp (!) 97.3 F (36.3 C)   Resp 18   Ht 5\' 11"  (1.803 m)   Wt (!) 305 lb 3.2 oz (138.4 kg)   BMI 42.57 kg/m      01/29/2022    1:58 PM 01/29/2022    1:37 PM 10/31/2021   11:19 AM  BP/Weight  Systolic BP 136 140 126  Diastolic BP 96 100 82  Wt. (Lbs)  305.2 323  BMI  42.57 kg/m2 42.61 kg/m2    Physical Exam Constitutional:      Appearance: Normal appearance. He is normal weight.  HENT:     Right Ear: Tympanic membrane normal.     Left Ear: Tympanic membrane normal.     Nose: Nose normal.  Cardiovascular:     Rate and Rhythm: Normal rate and regular rhythm.     Pulses: Normal pulses.     Heart sounds: Normal heart sounds.  Pulmonary:     Effort: Pulmonary effort is normal.     Breath sounds: Normal breath sounds.  Abdominal:     General: Bowel sounds are normal.  Neurological:     Mental Status: He is oriented to person, place, and time. Mental status is at baseline.  Psychiatric:        Mood and Affect: Mood normal.        Behavior: Behavior normal.     Diabetic Foot Exam - Simple   No data filed      Lab Results  Component Value Date   WBC 10.2 10/05/2020   HGB 15.6 10/05/2020   HCT 46.6 10/05/2020   PLT 231 10/05/2020   GLUCOSE 110 (H) 11/10/2021   CHOL 153  06/29/2020   TRIG 74 06/29/2020   HDL 50 06/29/2020   LDLCALC 88 06/29/2020   ALT 25 10/05/2020   AST 21 10/05/2020   NA 137 11/10/2021   K 4.7 11/10/2021   CL 104 11/10/2021   CREATININE 0.85 11/10/2021  BUN 12 11/10/2021   CO2 17 (L) 11/10/2021   TSH 1.420 10/05/2020   INR 1.2 10/31/2021   HGBA1C 5.2 10/31/2021      Assessment & Plan:   Problem List Items Addressed This Visit       Cardiovascular and Mediastinum   Dilated cardiomyopathy (HCC)    The current medical regimen is effective;  continue present plan and medications.       CHF (congestive heart failure) (HCC)    The current medical regimen is effective;  continue present plan and medications. Management per specialist.          Other   History of COVID-19    Resulted in PE and cardiomyopathy.      Seasonal allergies    The current medical regimen is effective;  continue present plan and medications.       Class 3 severe obesity due to excess calories with body mass index (BMI) of 40.0 to 44.9 in adult [facility])    Recommend continue to work on eating healthy diet and exercise.       Mixed hyperlipidemia - Primary    Well controlled.  No changes to medicines.  Continue to work on eating a healthy diet and exercise.  Labs to be drawn tomorrow at cardiology.      Relevant Orders   CBC with Differential/Platelet   Comprehensive metabolic panel   Lipid panel  .  No orders of the defined types were placed in this encounter.   Orders Placed This Encounter  Procedures   CBC with Differential/Platelet   Comprehensive metabolic panel   Lipid panel     Follow-up: Return in about 6 months (around 08/01/2022) for chronic fasting.  An After Visit Summary was printed and given to the patient.  Total time spent on today's visit was greater than 30 minutes, including both face-to-face time and nonface-to-face time personally spent on review of chart (labs and imaging), discussing labs and goals,  discussing further work-up, treatment options, referrals to specialist if needed, reviewing outside records of pertinent, answering patient's questions, and coordinating care.  Blane Ohara, MD [facility] [phone]

## 2022-01-29 NOTE — Assessment & Plan Note (Signed)
Resulted in PE and cardiomyopathy.

## 2022-01-29 NOTE — Assessment & Plan Note (Signed)
Well controlled.  No changes to medicines.  Continue to work on eating a healthy diet and exercise.  Labs to be drawn tomorrow at cardiology.

## 2022-01-29 NOTE — Assessment & Plan Note (Signed)
The current medical regimen is effective;  continue present plan and medications.  

## 2022-02-27 ENCOUNTER — Telehealth: Payer: Self-pay

## 2022-02-27 ENCOUNTER — Ambulatory Visit: Payer: BC Managed Care – PPO | Attending: Cardiology

## 2022-02-27 NOTE — Telephone Encounter (Signed)
Overdue to have INR checked. Pt missed anticoagulation appt. Called and spoke with pt's grandparents and they asked for me to call pt's cell. Called pt's cell and no answer. Unable to leave voicemail.

## 2022-03-07 ENCOUNTER — Ambulatory Visit (HOSPITAL_COMMUNITY)
Admission: RE | Admit: 2022-03-07 | Discharge: 2022-03-07 | Disposition: A | Discharge status: Home / Self Care | Payer: BC Managed Care – PPO | Source: Ambulatory Visit | Attending: Cardiology | Admitting: Cardiology

## 2022-03-07 ENCOUNTER — Encounter (HOSPITAL_COMMUNITY): Payer: Self-pay | Admitting: Cardiology

## 2022-03-07 ENCOUNTER — Ambulatory Visit (HOSPITAL_BASED_OUTPATIENT_CLINIC_OR_DEPARTMENT_OTHER)
Admission: RE | Admit: 2022-03-07 | Discharge: 2022-03-07 | Disposition: A | Discharge status: Home / Self Care | Payer: BC Managed Care – PPO | Source: Ambulatory Visit | Attending: Cardiology | Admitting: Cardiology

## 2022-03-07 VITALS — BP 140/88 | HR 78 | Wt 307.8 lb

## 2022-03-07 DIAGNOSIS — Z8673 Personal history of transient ischemic attack (TIA), and cerebral infarction without residual deficits: Secondary | ICD-10-CM | POA: Insufficient documentation

## 2022-03-07 DIAGNOSIS — Z79899 Other long term (current) drug therapy: Secondary | ICD-10-CM | POA: Insufficient documentation

## 2022-03-07 DIAGNOSIS — Z7901 Long term (current) use of anticoagulants: Secondary | ICD-10-CM | POA: Diagnosis not present

## 2022-03-07 DIAGNOSIS — Z87891 Personal history of nicotine dependence: Secondary | ICD-10-CM | POA: Diagnosis not present

## 2022-03-07 DIAGNOSIS — I5022 Chronic systolic (congestive) heart failure: Secondary | ICD-10-CM | POA: Diagnosis not present

## 2022-03-07 DIAGNOSIS — I110 Hypertensive heart disease with heart failure: Secondary | ICD-10-CM | POA: Insufficient documentation

## 2022-03-07 DIAGNOSIS — I5021 Acute systolic (congestive) heart failure: Secondary | ICD-10-CM

## 2022-03-07 DIAGNOSIS — Z8616 Personal history of COVID-19: Secondary | ICD-10-CM | POA: Insufficient documentation

## 2022-03-07 DIAGNOSIS — I428 Other cardiomyopathies: Secondary | ICD-10-CM | POA: Insufficient documentation

## 2022-03-07 LAB — BASIC METABOLIC PANEL
Anion gap: 9 (ref 5–15)
BUN: 12 mg/dL (ref 6–20)
CO2: 22 mmol/L (ref 22–32)
Calcium: 9 mg/dL (ref 8.9–10.3)
Chloride: 107 mmol/L (ref 98–111)
Creatinine, Ser: 1.02 mg/dL (ref 0.61–1.24)
GFR, Estimated: >60 mL/min (ref >60)
Glucose, Bld: 101 mg/dL — ABNORMAL HIGH (ref 70–99)
Potassium: 4 mmol/L (ref 3.5–5.1)
Sodium: 138 mmol/L (ref 135–145)

## 2022-03-07 LAB — CBC
HCT: 45.4 % (ref 39.0–52.0)
Hemoglobin: 15.3 g/dL (ref 13.0–17.0)
MCH: 29.3 pg (ref 26.0–34.0)
MCHC: 33.7 g/dL (ref 30.0–36.0)
MCV: 87 fL (ref 80.0–100.0)
Platelets: 201 10*3/uL (ref 150–400)
RBC: 5.22 MIL/uL (ref 4.22–5.81)
RDW: 12.9 % (ref 11.5–15.5)
WBC: 7.5 10*3/uL (ref 4.0–10.5)
nRBC: 0 % (ref 0.0–0.2)

## 2022-03-07 LAB — ECHOCARDIOGRAM COMPLETE
AR max vel: 4.11 cm2
AV Peak grad: 5.3 mmHg
Ao pk vel: 1.16 m/s
Area-P 1/2: 4.89 cm2
S' Lateral: 4.6 cm
Weight: 4924.8 oz

## 2022-03-07 LAB — BRAIN NATRIURETIC PEPTIDE: B Natriuretic Peptide: 13.9 pg/mL (ref 0.0–100.0)

## 2022-03-07 NOTE — Progress Notes (Signed)
PCP: Blane Ohara, MD  Cardiology: Dr. [name] is 26 y.o. male with a hx of hypertension, anxiety and prior COVID-19 infection in June in 2021 who presented to [facility] in 10/21 with shortness of breath and hemoptysis x 1 month.  An echocardiogram demonstrated EF < 20% and an LV thrombus.  A CT scan demonstrated multifocal pneumonia, no PE.  He was started on furosemide and Lovenox and transferred to [facility] for further evaluation and management.  He was started on milrinone with low output by co-ox and diuresed. Cardiac MRI showed severe LV dilation with diffuse hypokinesis, EF 15%, small LV apical thrombus, moderate RV dilation with EF 19%, LGE pattern concerning for myocarditis with diffuse patchy mid-wall LGE.  He was treated with ceftriaxone and doxycycline for PNA. He had a CVA with left-sided weakness and left facial numbness.  MRI showed multifocal bilateral infarcts consistent with cardio-embolism.  He was transitioned from Lovenox/warfarin to IV heparin gtt/warfarin.  Left-sided weakness resolved. He was titrated onto cardiac meds and eventually discharged home.   Echo in 3/22 showed EF 35% with global hypokinesis, mild LV dilation, mildly decreased RV systolic function. No LV thrombus.     Echo 9/22 showed EF 45% with mild LVH, diffuse hypokinesis, normal RV, no thrombus.   Today he returns for HF follow up with his grandfather.  Weight is down 16 lbs.  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  Spending a lot of time outside.  No exertional dyspnea or chest pain.  Bothered mainly by allergic rhinitis/congestion.  No orthopnea/PND.  No palpitations. Occasional ETOH.  No drugs.   ECG (personally reviewed): NSR, normal  Labs (10/21): K 4.9, creatinine 1.06 Labs (11/21): K 5, creatinine 0.96 Labs (12/21): digoxin 0.7, LDL 80, HDL 50, K 4.3, creatinine 4.12 Labs (7/22): K 4.7, creatinine 1.11 Labs (10/22): K 4.4,  creatinine 0.80 Labs (5/23): K 4.7, creatinine 0.85  PMH: 1. H/o COVID-19 PNA 6/21.  2. HTN 3. Anxiety 4. Chronic systolic CHF: Nonischemic cardiomyopathy.   - Echo (10/21): EF < 20%, LV thrombus.  - Cardiac MRI (10/21): Severe LV dilation with diffuse hypokinesis, EF 15%, small LV apical thrombus, moderate RV dilation with EF 19%, LGE pattern concerning for myocarditis, with diffuse patchy mid-wall LGE. - Echo (3/22): EF 35% with global hypokinesis, mild LV dilation, mildly decreased RV systolic function. No LV thrombus. - Echo (9/22): EF 45% with mild LVH, diffuse hypokinesis, normal RV, no thrombus.  5. CVA: 10/21, suspect cardio-embolic.  6. LV apical thrombus 7. Sleep study negative.  8. Crystal meth abuse  Social History   Socioeconomic History   Marital status: Single    Spouse name: Not on file   Number of children: Not on file   Years of education: Not on file   Highest education level: Not on file  Occupational History   Occupation: Full time/Plumbing  Tobacco Use   Smoking status: Former    Types: E-cigarettes   Smokeless tobacco: Current    Types: Chew   Tobacco comments:    quit around June 2021  Vaping Use   Vaping Use: Former   Substances: Nicotine  Substance and Sexual Activity   Alcohol use: Not Currently    Comment: Patient used to drink occasionally a beer, but stopped when started lexapro   Drug use: Never   Sexual activity: Yes    Birth control/protection: Condom  Other Topics Concern   Not on  file  Social History Narrative   Lives with grandmother, grandfather and sister   Right Handed   Drinks rarely caffeine   Social Determinants of Health   Financial Resource Strain: Not on file  Food Insecurity: Not on file  Transportation Needs: Not on file  Physical Activity: Not on file  Stress: Not on file  Social Connections: Not on file  Intimate Partner Violence: Not on file   Family History  Problem Relation Age of Onset   Diabetes Mother     Diabetes Paternal Grandfather    Heart failure Neg Hx    ROS: All systems reviewed and negative except as per HPI.   Current Outpatient Medications  Medication Sig Dispense Refill   atorvastatin (LIPITOR) 10 MG tablet TAKE ONE TABLET BY MOUTH EVERY DAY 30 tablet 5   carvedilol (COREG) 25 MG tablet Take 1 tablet (25 mg total) by mouth 2 (two) times daily with a meal. 180 tablet 3   dapagliflozin propanediol (FARXIGA) 10 MG TABS tablet TAKE 1 TABLET (10 MG TOTAL) BY MOUTH DAILY. 90 tablet 0   fexofenadine (ALLEGRA) 60 MG tablet Take 60 mg by mouth daily as needed for allergies or rhinitis.     furosemide (LASIX) 20 MG tablet Take 1 tablet (20 mg total) by mouth daily. 30 tablet 6   hydrOXYzine (ATARAX) 50 MG tablet Take 2 tablets (100 mg total) by mouth at bedtime as needed for anxiety. 60 tablet 0   montelukast (SINGULAIR) 10 MG tablet Take 10 mg by mouth at bedtime.     potassium chloride SA (KLOR-CON M) 20 MEQ tablet Take 20 mEq by mouth as needed.     sacubitril-valsartan (ENTRESTO) 97-103 MG Take 1 tablet by mouth 2 (two) times daily. 180 tablet 3   spironolactone (ALDACTONE) 50 MG tablet Take 1 tablet (50 mg total) by mouth daily. 90 tablet 3   warfarin (COUMADIN) 5 MG tablet daily. Patient takes 1 and 1/2 every day except Sundays and takes 1 tablet on Sundays.     No current facility-administered medications for this encounter.   Wt Readings from Last 3 Encounters:  03/07/22 (!) 139.6 kg (307 lb 12.8 oz)  01/29/22 (!) 138.4 kg (305 lb 3.2 oz)  10/31/21 (!) 146.5 kg (323 lb)   BP (!) 140/88   Pulse 78   Wt (!) 139.6 kg (307 lb 12.8 oz)   SpO2 97%   BMI 42.93 kg/m  General:  NAD. No resp difficulty HEENT: Normal Neck: Supple. JVP 7-8. Carotids 2+ bilat; no bruits. No lymphadenopathy or thryomegaly appreciated. Cor: PMI nondisplaced. Regular rate & rhythm. No rubs, gallops or murmurs. Lungs: Clear Abdomen: Obese,  nontender, nondistended. No hepatosplenomegaly. No bruits or  masses. Good bowel sounds. Extremities: No cyanosis, clubbing, rash, edema Neuro: Alert & oriented x 3, cranial nerves grossly intact. Moves all 4 extremities w/o difficulty. Affect pleasant.  Assessment/Plan: 1. Chronic systolic CHF: Appears to be nonischemic cardiomyopathy.  Echo in 10/21 at [facility] with EF < 20%, LV thrombus noted.  When he presented in 10/21, he had had dyspnea, orthopnea, and pleuritic chest pain since he had COVID-19 in 6/21. He feels like he never improved.  No family history of cardiomyopathy or premature CAD.  HIV negative. CMRI in 10/21 with LVEF 15%, RVEF 19%, LGE pattern concerning for myocarditis. Suspect most likely etiology for cardiomyopathy is viral myocarditis due to COVID-19 though methamphetamine use may have contributed as well.  Echo in 3/22 showed improved EF to 35%. Echo 9/22  showed EF up to 45%. NYHA class I, not volume overloaded on exam.  - Use Lasix prn.  - Continue Entresto 97/103 mg bid.   - Continue spironolactone 50 mg daily. BMET today. - Continue Farxiga 10 mg daily. - Continue Coreg 25 mg bid.  - I will arrange for repeat echo.    2. LV thrombus: On warfarin, INR followed at [facility] in [place]. No thrombus seen on recent echo 9/22.  - Repeat echo. If EF > 50%, will stop warfarin.  3. CVA: Cardioembolic from LV thrombus.  Seems to have minimal residual symptoms (mild tingling left face). - Continue warfarin until EF has recovered.  4. Methamphetamine abuse: He has quit.  - Strongly urged him not to relapse.  5. HTN: Stable. Continue current regimen.   Followup in 6 months with APP but will need BMET in 3 months.   [name]  03/07/2022

## 2022-03-07 NOTE — Patient Instructions (Signed)
There has been no changes to your medications.  Labs done today, your results will be available in MyChart, we will contact you for abnormal readings.  Repeat blood work in 3 months.  Your physician has requested that you have an echocardiogram. Echocardiography is a painless test that uses sound waves to create images of your heart. It provides your doctor with information about the size and shape of your heart and how well your heart's chambers and valves are working. This procedure takes approximately one hour. There are no restrictions for this procedure.  Your physician recommends that you schedule a follow-up appointment in: 6 months ( March 2024) ** please call the office in January to arrange your follow up appointment **  If you have any questions or concerns before your next appointment please send [place] a message through [place] or call our office at [phone].    TO LEAVE A MESSAGE FOR THE NURSE SELECT OPTION 2, PLEASE LEAVE A MESSAGE INCLUDING: YOUR NAME DATE OF BIRTH CALL BACK NUMBER REASON FOR CALL**this is important as we prioritize the call backs  YOU WILL RECEIVE A CALL BACK THE SAME DAY AS LONG AS YOU CALL BEFORE 4:00 PM  At the [facility], you and your health needs are our priority. As part of our continuing mission to provide you with exceptional heart care, we have created designated Provider Care Teams. These Care Teams include your primary Cardiologist (physician) and Advanced Practice Providers (APPs- Physician Assistants and Nurse Practitioners) who all work together to provide you with the care you need, when you need it.   You may see any of the following providers on your designated Care Team at your next follow up: Dr Arvilla Meres Dr [name] Dr. Marcos Eke, NP Robbie Lis, [place] [facility] [place], [place] Brynda Peon, NP Karle Plumber, PharmD   Please be sure to bring in all your medications bottles  to every appointment.

## 2022-03-08 ENCOUNTER — Other Ambulatory Visit: Payer: Self-pay

## 2022-03-08 DIAGNOSIS — I5022 Chronic systolic (congestive) heart failure: Secondary | ICD-10-CM

## 2022-03-13 ENCOUNTER — Encounter (HOSPITAL_COMMUNITY): Payer: Self-pay

## 2022-03-14 ENCOUNTER — Telehealth (HOSPITAL_COMMUNITY): Payer: Self-pay | Admitting: Cardiology

## 2022-03-14 NOTE — Telephone Encounter (Signed)
Bonita Quin patients grandmother (DPR approved) returned call regarding results  Echo results reviewed and voiced understanding

## 2022-03-15 ENCOUNTER — Telehealth: Payer: Self-pay

## 2022-03-15 NOTE — Telephone Encounter (Signed)
Pt has not been seen in Coumadin Clinic since May, 2023. Called pt, no answer. Left message on voicemail.

## 2022-03-19 ENCOUNTER — Other Ambulatory Visit: Payer: Self-pay

## 2022-03-19 MED ORDER — HYDROXYZINE HCL 50 MG PO TABS: 100.0000 mg | ORAL_TABLET | Freq: Every evening | ORAL | 1 refills | Status: DC | PRN

## 2022-05-13 IMAGING — MR MR CARD MORPHOLOGY WO/W CM
45 of 48 series · 45 of 48 positions shown · IV contrast (Contrast agent)
Comparison: none

CLINICAL DATA: Cardiomyopathy of uncertain etiology

EXAM:
CARDIAC MRI
TECHNIQUE: The patient was scanned on a 1.5 Tesla GE magnet. A dedicated
cardiac coil was used. Functional imaging was done using Fiesta
sequences. [DATE], and 4 chamber views were done to assess for RWMA's.
Modified Zeinab rule using a short axis stack was used to
calculate an ejection fraction on a dedicated work station using
Circle software. The patient received 10 cc of Gadavist. After 10
minutes inversion recovery sequences were used to assess for
infiltration and scar tissue.
CONTRAST:  Gadavist 10 cc

[Series 4: [id number] · axial · 8.0mm · 1.64mm/px · 1 of 20 slices shown]
[im 1/20]
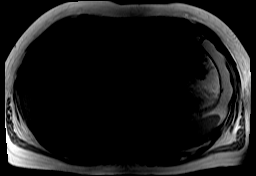

[Series 8: bSSFP · oblique · 8.0mm · 1.61mm/px · 1 of 25 slices shown (1 of 25)]
[im 1/25]
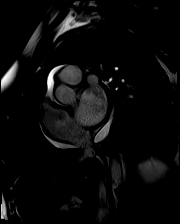

[Series 9: bSSFP · oblique · 8.0mm · 1.61mm/px · 1 of 25 slices shown (2 of 25)]
[im 1/25]
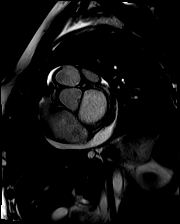

[Series 10: bSSFP · oblique · 8.0mm · 1.61mm/px · 1 of 25 slices shown (3 of 25)]
[im 1/25]
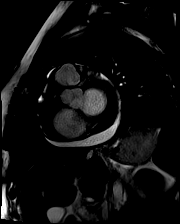

[Series 11: bSSFP · oblique · 8.0mm · 1.61mm/px · 1 of 25 slices shown (4 of 25)]
[im 1/25]
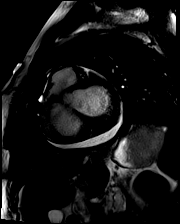

[Series 12: bSSFP · oblique · 8.0mm · 1.61mm/px · 1 of 25 slices shown (5 of 25)]
[im 1/25]
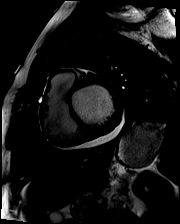

[Series 13: bSSFP · oblique · 8.0mm · 1.61mm/px · 1 of 25 slices shown (6 of 25)]
[im 1/25]
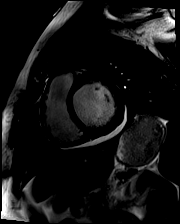

[Series 14: bSSFP · oblique · 8.0mm · 1.61mm/px · 1 of 25 slices shown (7 of 25)]
[im 1/25]
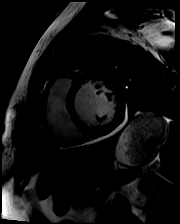

[Series 15: bSSFP · oblique · 8.0mm · 1.61mm/px · 1 of 25 slices shown (8 of 25)]
[im 1/25]
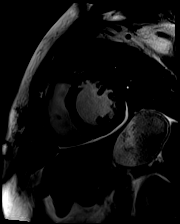

[Series 16: bSSFP · oblique · 8.0mm · 1.61mm/px · 1 of 25 slices shown (9 of 25)]
[im 1/25]
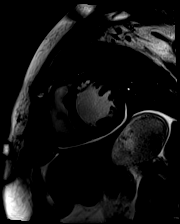

[Series 17: bSSFP · oblique · 8.0mm · 1.61mm/px · 1 of 25 slices shown (10 of 25)]
[im 1/25]
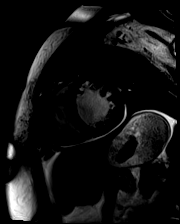

[Series 18: bSSFP · oblique · 8.0mm · 1.61mm/px · 1 of 25 slices shown (11 of 25)]
[im 1/25]
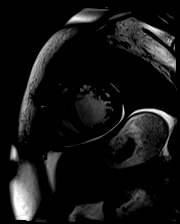

[Series 19: bSSFP · oblique · 8.0mm · 1.61mm/px · 1 of 25 slices shown (12 of 25)]
[im 1/25]
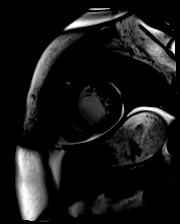

[Series 20: bSSFP · oblique · 8.0mm · 1.61mm/px · 1 of 25 slices shown (13 of 25)]
[im 1/25]
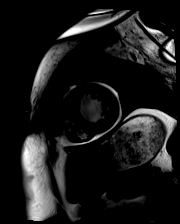

[Series 21: bSSFP · oblique · 8.0mm · 1.61mm/px · 1 of 25 slices shown (14 of 25)]
[im 1/25]
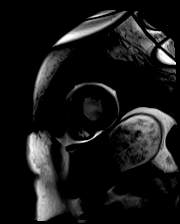

[Series 22: bSSFP · oblique · 8.0mm · 1.61mm/px · 1 of 25 slices shown (15 of 25)]
[im 1/25]
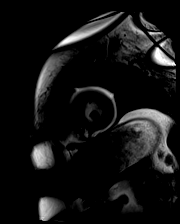

[Series 23: bSSFP · oblique · 8.0mm · 1.61mm/px · 1 of 25 slices shown (16 of 25)]
[im 1/25]
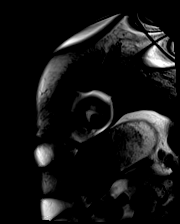

[Series 24: bSSFP · oblique · 8.0mm · 1.61mm/px · 1 of 25 slices shown (17 of 25)]
[im 1/25]
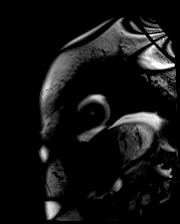

[Series 25: bSSFP · oblique · 8.0mm · 1.61mm/px · 1 of 25 slices shown (18 of 25)]
[im 1/25]
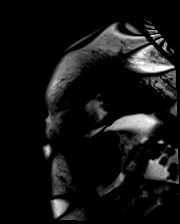

[Series 26: bSSFP · oblique · 8.0mm · 1.61mm/px · 1 of 25 slices shown (19 of 25)]
[im 1/25]
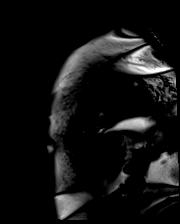

[Series 27: bSSFP · oblique · 8.0mm · 1.61mm/px · 1 of 25 slices shown (20 of 25)]
[im 1/25]
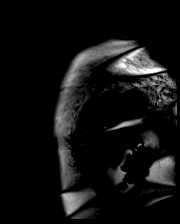

[Series 28: STIR · oblique · 8.0mm · 1.92mm/px · 1 of 19 slices shown]
[im 1/19]
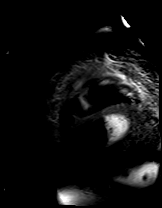

[Series 29: bSSFP · oblique · 6.0mm · 1.41mm/px · 1 of 25 slices shown (21 of 25)]
[im 1/25]
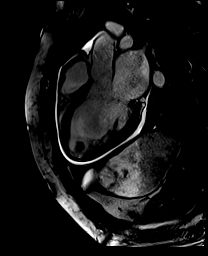

[Series 30: bSSFP · oblique · 6.0mm · 1.41mm/px · 1 of 25 slices shown (22 of 25)]
[im 1/25]
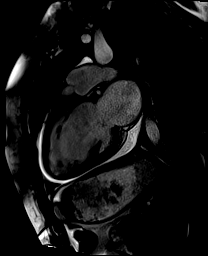

[Series 31: bSSFP · axial · 6.0mm · 1.41mm/px · 1 of 25 slices shown (23 of 25)]
[im 1/25]
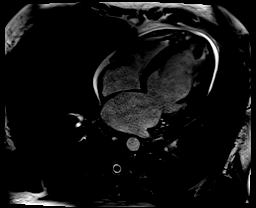

[Series 32: (id)_long_t1 · oblique · 8.0mm · 1.56mm/px · 1 of 24 slices shown]
[im 1/24]
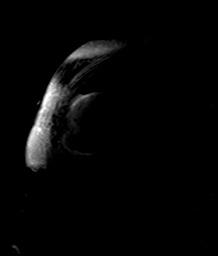

[Series 33: (id)_long_t1_moco · oblique · 8.0mm · 1.56mm/px · 1 of 24 slices shown]
[im 1/24]
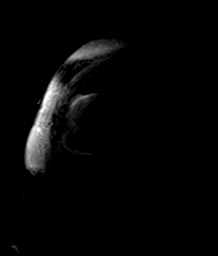

[Series 34: (id)_long_t1_moco_t1 · oblique · 8.0mm · 1.56mm/px · 1 of 6 slices shown]
[im 1/6]
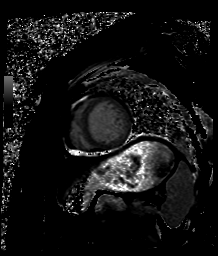

[Series 36: (id)_trufi · oblique · 8.0mm · 2.08mm/px · 1 of 9 slices shown]
[im 1/9]
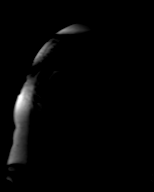

[Series 37: (id)_trufi_moco · oblique · 8.0mm · 2.08mm/px · 1 of 9 slices shown]
[im 1/9]
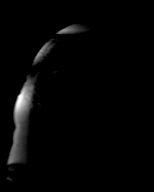

[Series 38: (id)_trufi_moco_t2 · oblique · 8.0mm · 2.08mm/px · 1 of 3 slices shown]
[im 1/3]
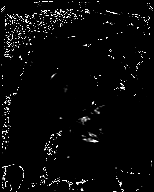

[Series 40: pre short axis · oblique · non-contrast · 8.0mm · 2.25mm/px · 1 of 10 slices shown (1 of 6)]
[im 1/10]
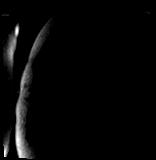

[Series 41: pre short axis · oblique · non-contrast · 8.0mm · 2.25mm/px · 1 of 10 slices shown (2 of 6)]
[im 1/10]
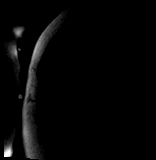

[Series 42: pre short axis · oblique · non-contrast · 8.0mm · 2.25mm/px · 1 of 10 slices shown (3 of 6)]
[im 1/10]
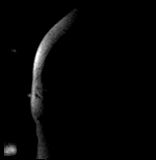

[Series 43: pre short axis · oblique · non-contrast · 8.0mm · 2.25mm/px · 1 of 10 slices shown (4 of 6)]
[im 1/10]
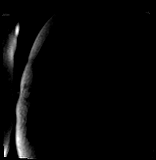

[Series 44: pre short axis · oblique · non-contrast · 8.0mm · 2.25mm/px · 1 of 10 slices shown (5 of 6)]
[im 1/10]
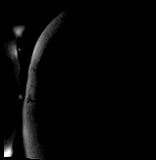

[Series 45: pre short axis · oblique · non-contrast · 8.0mm · 2.25mm/px · 1 of 10 slices shown (6 of 6)]
[im 1/10]
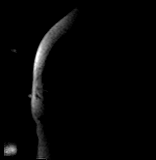

[Series 46: rest short axis · oblique · 8.0mm · 2.50mm/px · 1 of 80 slices shown (1 of 6)]
[im 1/80]
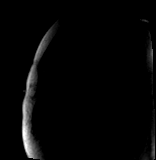

[Series 47: rest short axis · oblique · 8.0mm · 2.50mm/px · 1 of 80 slices shown (2 of 6)]
[im 1/80]
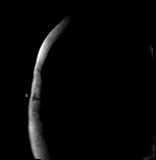

[Series 48: rest short axis · oblique · 8.0mm · 2.50mm/px · 1 of 80 slices shown (3 of 6)]
[im 1/80]
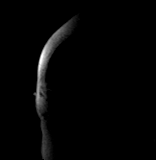

[Series 49: rest short axis · oblique · 8.0mm · 2.50mm/px · 1 of 80 slices shown (4 of 6)]
[im 1/80]
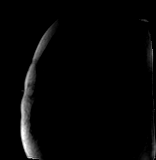

[Series 50: rest short axis · oblique · 8.0mm · 2.50mm/px · 1 of 80 slices shown (5 of 6)]
[im 1/80]
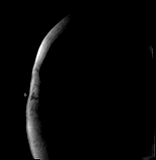

[Series 51: rest short axis · oblique · 8.0mm · 2.50mm/px · 1 of 80 slices shown (6 of 6)]
[im 1/80]
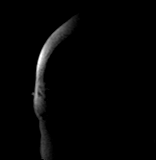

[Series 52: bSSFP · oblique · 8.0mm · 2.02mm/px · 1 of 19 slices shown (24 of 25)]
[im 1/19]
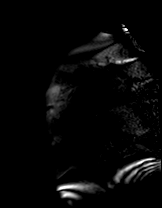

[Series 53: bSSFP · oblique · 8.0mm · 2.02mm/px · 1 of 19 slices shown (25 of 25)]
[im 1/19]
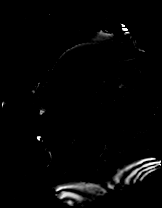

[45 of 48 positions shown; findings below may reference images not displayed]

FINDINGS: Limited images of the lung fields showed airspace disease left base.

Small circumferential pericardial effusion.

Severely dilated left ventricle with normal wall thickness, diffuse
hypokinesis with EF 15%. 1.8 x 1.2 LV apical thrombus noted.
Moderately dilated right ventricle, EF 19%. Moderately dilated left
atrium, mildly dilated right atrium. Trileaflet aortic valve, no
stenosis or regurgitation. Probably mild mitral regurgitation.

On delayed enhancement imaging, there appears to be patchy mid-wall
late gadolinium enhancement (LGE) in the basal to mid septum and
lateral wall.

Measurements:

LVEDV 338 mL

LVSV 52 mL
LVEF 15%

RVEDV 254 mL
RVSV 48 mL
RVEF 19%
IMPRESSION: 1.  Possible left basilar PNA.

2.  Small circumferential pericardial effusion.

3. Severe LV dilation with diffuse hypokinesis, EF 15%. There is a
small LV apical thrombus.

4.  Moderate RV dilation with EF 19%.

5. LGE pattern concerning for myocarditis, with diffuse patchy
mid-wall LGE.

Lissette Gonzo

## 2022-05-15 ENCOUNTER — Ambulatory Visit: Payer: BC Managed Care – PPO | Attending: Cardiology

## 2022-05-15 DIAGNOSIS — I240 Acute coronary thrombosis not resulting in myocardial infarction: Secondary | ICD-10-CM

## 2022-05-15 DIAGNOSIS — Z5181 Encounter for therapeutic drug level monitoring: Secondary | ICD-10-CM | POA: Diagnosis not present

## 2022-05-15 LAB — PROTIME-INR
INR: 2.4 — ABNORMAL HIGH (ref 0.9–1.2)
Prothrombin Time: 23.5 s — ABNORMAL HIGH (ref 9.1–12.0)

## 2022-05-15 NOTE — Patient Instructions (Signed)
Description   Called pt and instructed to continue taking 1 tablet daily. Recheck INR in 1 week.  Coumadin Clinic: [phone]

## 2022-05-22 ENCOUNTER — Ambulatory Visit: Payer: BC Managed Care – PPO | Attending: Cardiology

## 2022-05-22 ENCOUNTER — Telehealth: Payer: Self-pay

## 2022-05-22 NOTE — Telephone Encounter (Signed)
Pt missed Anticoagulation clinic appt. Called, no answer. Left message on voicemail.

## 2022-06-07 ENCOUNTER — Other Ambulatory Visit: Payer: BC Managed Care – PPO

## 2022-08-06 ENCOUNTER — Ambulatory Visit: Payer: BC Managed Care – PPO | Admitting: Family Medicine

## 2022-12-13 ENCOUNTER — Telehealth: Payer: Self-pay

## 2022-12-13 NOTE — Telephone Encounter (Signed)
Called patient to see if he can come in this morning for his appointment. Per Marianne Sofia, PA-C she think he need to be seen before Monday. Left voicemail for patient to call office back.

## 2022-12-17 ENCOUNTER — Emergency Department (HOSPITAL_COMMUNITY): Payer: BC Managed Care – PPO

## 2022-12-17 ENCOUNTER — Inpatient Hospital Stay (HOSPITAL_COMMUNITY)
Admission: EM | Admit: 2022-12-17 | Discharge: 2022-12-21 | DRG: 291 | Disposition: A | Discharge status: Home / Self Care | Payer: BC Managed Care – PPO | Attending: Student | Admitting: Student

## 2022-12-17 ENCOUNTER — Ambulatory Visit: Payer: BC Managed Care – PPO | Admitting: Physician Assistant

## 2022-12-17 ENCOUNTER — Encounter: Payer: Self-pay | Admitting: Physician Assistant

## 2022-12-17 ENCOUNTER — Other Ambulatory Visit: Payer: Self-pay

## 2022-12-17 VITALS — BP 180/120 | HR 84 | Temp 97.3°F | Resp 18 | Ht 72.0 in | Wt 303.0 lb

## 2022-12-17 DIAGNOSIS — I2489 Other forms of acute ischemic heart disease: Secondary | ICD-10-CM | POA: Diagnosis not present

## 2022-12-17 DIAGNOSIS — F1510 Other stimulant abuse, uncomplicated: Secondary | ICD-10-CM | POA: Diagnosis present

## 2022-12-17 DIAGNOSIS — F1990 Other psychoactive substance use, unspecified, uncomplicated: Secondary | ICD-10-CM

## 2022-12-17 DIAGNOSIS — R9431 Abnormal electrocardiogram [ECG] [EKG]: Secondary | ICD-10-CM | POA: Diagnosis not present

## 2022-12-17 DIAGNOSIS — I5021 Acute systolic (congestive) heart failure: Secondary | ICD-10-CM | POA: Diagnosis present

## 2022-12-17 DIAGNOSIS — I110 Hypertensive heart disease with heart failure: Secondary | ICD-10-CM

## 2022-12-17 DIAGNOSIS — F1010 Alcohol abuse, uncomplicated: Secondary | ICD-10-CM | POA: Diagnosis present

## 2022-12-17 DIAGNOSIS — Z79899 Other long term (current) drug therapy: Secondary | ICD-10-CM | POA: Diagnosis not present

## 2022-12-17 DIAGNOSIS — I420 Dilated cardiomyopathy: Secondary | ICD-10-CM

## 2022-12-17 DIAGNOSIS — Z91148 Patient's other noncompliance with medication regimen for other reason: Secondary | ICD-10-CM

## 2022-12-17 DIAGNOSIS — R0602 Shortness of breath: Secondary | ICD-10-CM | POA: Diagnosis not present

## 2022-12-17 DIAGNOSIS — Z6841 Body Mass Index (BMI) 40.0 and over, adult: Secondary | ICD-10-CM | POA: Diagnosis not present

## 2022-12-17 DIAGNOSIS — Z87891 Personal history of nicotine dependence: Secondary | ICD-10-CM | POA: Diagnosis not present

## 2022-12-17 DIAGNOSIS — Z8616 Personal history of COVID-19: Secondary | ICD-10-CM | POA: Diagnosis not present

## 2022-12-17 DIAGNOSIS — R000 Tachycardia, unspecified: Secondary | ICD-10-CM | POA: Diagnosis not present

## 2022-12-17 DIAGNOSIS — F1490 Cocaine use, unspecified, uncomplicated: Secondary | ICD-10-CM | POA: Diagnosis not present

## 2022-12-17 DIAGNOSIS — R457 State of emotional shock and stress, unspecified: Secondary | ICD-10-CM | POA: Diagnosis not present

## 2022-12-17 DIAGNOSIS — I5022 Chronic systolic (congestive) heart failure: Secondary | ICD-10-CM | POA: Insufficient documentation

## 2022-12-17 DIAGNOSIS — F909 Attention-deficit hyperactivity disorder, unspecified type: Secondary | ICD-10-CM | POA: Diagnosis not present

## 2022-12-17 DIAGNOSIS — R079 Chest pain, unspecified: Secondary | ICD-10-CM | POA: Diagnosis not present

## 2022-12-17 DIAGNOSIS — I10 Essential (primary) hypertension: Secondary | ICD-10-CM | POA: Insufficient documentation

## 2022-12-17 DIAGNOSIS — Z91128 Patient's intentional underdosing of medication regimen for other reason: Secondary | ICD-10-CM | POA: Diagnosis not present

## 2022-12-17 DIAGNOSIS — R042 Hemoptysis: Secondary | ICD-10-CM | POA: Diagnosis not present

## 2022-12-17 DIAGNOSIS — N179 Acute kidney failure, unspecified: Secondary | ICD-10-CM | POA: Diagnosis present

## 2022-12-17 DIAGNOSIS — Z8673 Personal history of transient ischemic attack (TIA), and cerebral infarction without residual deficits: Secondary | ICD-10-CM

## 2022-12-17 DIAGNOSIS — I509 Heart failure, unspecified: Secondary | ICD-10-CM | POA: Diagnosis not present

## 2022-12-17 DIAGNOSIS — E872 Acidosis, unspecified: Secondary | ICD-10-CM | POA: Diagnosis not present

## 2022-12-17 DIAGNOSIS — E66813 Obesity, class 3: Secondary | ICD-10-CM

## 2022-12-17 DIAGNOSIS — I5023 Acute on chronic systolic (congestive) heart failure: Secondary | ICD-10-CM | POA: Diagnosis present

## 2022-12-17 DIAGNOSIS — J90 Pleural effusion, not elsewhere classified: Secondary | ICD-10-CM | POA: Diagnosis not present

## 2022-12-17 DIAGNOSIS — E782 Mixed hyperlipidemia: Secondary | ICD-10-CM | POA: Diagnosis present

## 2022-12-17 LAB — BASIC METABOLIC PANEL
Anion gap: 16 — ABNORMAL HIGH (ref 5–15)
BUN: 12 mg/dL (ref 6–20)
CO2: 16 mmol/L — ABNORMAL LOW (ref 22–32)
Calcium: 9 mg/dL (ref 8.9–10.3)
Chloride: 106 mmol/L (ref 98–111)
Creatinine, Ser: 1.28 mg/dL — ABNORMAL HIGH (ref 0.61–1.24)
GFR, Estimated: >60 mL/min (ref >60)
Glucose, Bld: 80 mg/dL (ref 70–99)
Potassium: 4.3 mmol/L (ref 3.5–5.1)
Sodium: 138 mmol/L (ref 135–145)

## 2022-12-17 LAB — CBC WITH DIFFERENTIAL/PLATELET
Abs Immature Granulocytes: 0.06 10*3/uL (ref 0.00–0.07)
Basophils Absolute: 0.1 10*3/uL (ref 0.0–0.1)
Basophils Relative: 1 %
Eosinophils Absolute: 0.1 10*3/uL (ref 0.0–0.5)
Eosinophils Relative: 1 %
HCT: 50 % (ref 39.0–52.0)
Hemoglobin: 16.8 g/dL (ref 13.0–17.0)
Immature Granulocytes: 1 %
Lymphocytes Relative: 19 %
Lymphs Abs: 2 10*3/uL (ref 0.7–4.0)
MCH: 32.5 pg (ref 26.0–34.0)
MCHC: 33.6 g/dL (ref 30.0–36.0)
MCV: 96.7 fL (ref 80.0–100.0)
Monocytes Absolute: 0.6 10*3/uL (ref 0.1–1.0)
Monocytes Relative: 6 %
Neutro Abs: 7.8 10*3/uL — ABNORMAL HIGH (ref 1.7–7.7)
Neutrophils Relative %: 72 %
Platelets: 291 10*3/uL (ref 150–400)
RBC: 5.17 MIL/uL (ref 4.22–5.81)
RDW: 15.9 % — ABNORMAL HIGH (ref 11.5–15.5)
WBC: 10.7 10*3/uL — ABNORMAL HIGH (ref 4.0–10.5)
nRBC: 0.2 % (ref 0.0–0.2)

## 2022-12-17 LAB — I-STAT CHEM 8, ED
BUN: 13 mg/dL (ref 6–20)
Calcium, Ion: 0.93 mmol/L — ABNORMAL LOW (ref 1.15–1.40)
Chloride: 109 mmol/L (ref 98–111)
Creatinine, Ser: 1.3 mg/dL — ABNORMAL HIGH (ref 0.61–1.24)
Glucose, Bld: 79 mg/dL (ref 70–99)
HCT: 49 % (ref 39.0–52.0)
Hemoglobin: 16.7 g/dL (ref 13.0–17.0)
Potassium: 4 mmol/L (ref 3.5–5.1)
Sodium: 136 mmol/L (ref 135–145)
TCO2: 20 mmol/L — ABNORMAL LOW (ref 22–32)

## 2022-12-17 LAB — URINALYSIS, ROUTINE W REFLEX MICROSCOPIC
Bilirubin Urine: NEGATIVE
Glucose, UA: NEGATIVE mg/dL
Hgb urine dipstick: NEGATIVE
Ketones, ur: NEGATIVE mg/dL
Leukocytes,Ua: NEGATIVE
Nitrite: NEGATIVE
Protein, ur: NEGATIVE mg/dL
Specific Gravity, Urine: 1.01 (ref 1.005–1.030)
pH: 7 (ref 5.0–8.0)

## 2022-12-17 LAB — RAPID URINE DRUG SCREEN, HOSP PERFORMED
Amphetamines: POSITIVE — AB
Barbiturates: NOT DETECTED
Benzodiazepines: NOT DETECTED
Cocaine: NOT DETECTED
Opiates: NOT DETECTED
Tetrahydrocannabinol: POSITIVE — AB

## 2022-12-17 LAB — PROTIME-INR
INR: 1.1 (ref 0.8–1.2)
Prothrombin Time: 13.9 seconds (ref 11.4–15.2)

## 2022-12-17 LAB — LACTIC ACID, PLASMA
Lactic Acid, Venous: 6.5 mmol/L (ref 0.5–1.9)
Lactic Acid, Venous: 1.2 mmol/L (ref 0.5–1.9)

## 2022-12-17 LAB — TROPONIN I (HIGH SENSITIVITY)
Troponin I (High Sensitivity): 67 ng/L — ABNORMAL HIGH (ref <18)
Troponin I (High Sensitivity): 89 ng/L — ABNORMAL HIGH (ref <18)

## 2022-12-17 LAB — CBG MONITORING, ED: Glucose-Capillary: 94 mg/dL (ref 70–99)

## 2022-12-17 LAB — BRAIN NATRIURETIC PEPTIDE: B Natriuretic Peptide: 510.8 pg/mL — ABNORMAL HIGH (ref 0.0–100.0)

## 2022-12-17 LAB — POC COVID19 BINAXNOW: SARS Coronavirus 2 Ag: NEGATIVE

## 2022-12-17 MED ORDER — LACTATED RINGERS IV BOLUS
500.0000 mL | Freq: Once | INTRAVENOUS | Status: AC
  Administered 2022-12-17: 500 mL via INTRAVENOUS

## 2022-12-17 MED ORDER — FUROSEMIDE 10 MG/ML IJ SOLN: 40.0000 mg | Freq: Two times a day (BID) | INTRAMUSCULAR | Status: DC

## 2022-12-17 MED ORDER — ALBUTEROL SULFATE HFA 108 (90 BASE) MCG/ACT IN AERS: 2.0000 | INHALATION_SPRAY | RESPIRATORY_TRACT | Status: DC | PRN

## 2022-12-17 MED ORDER — ONDANSETRON HCL 4 MG/2ML IJ SOLN: 4.0000 mg | Freq: Four times a day (QID) | INTRAMUSCULAR | Status: DC | PRN

## 2022-12-17 MED ORDER — SODIUM CHLORIDE 0.9% FLUSH
3.0000 mL | INTRAVENOUS | Status: DC | PRN
  Administered 2022-12-19: 3 mL via INTRAVENOUS

## 2022-12-17 MED ORDER — LORAZEPAM 2 MG/ML IJ SOLN
1.0000 mg | Freq: Once | INTRAMUSCULAR | Status: AC
  Administered 2022-12-18: 1 mg via INTRAVENOUS
  Filled 2022-12-17: qty 1

## 2022-12-17 MED ORDER — FUROSEMIDE 10 MG/ML IJ SOLN
80.0000 mg | Freq: Once | INTRAMUSCULAR | Status: AC
  Administered 2022-12-17: 80 mg via INTRAVENOUS
  Filled 2022-12-17: qty 8

## 2022-12-17 MED ORDER — SODIUM CHLORIDE 0.9 % IV SOLN
2.0000 g | Freq: Once | INTRAVENOUS | Status: AC
  Administered 2022-12-17: 2 g via INTRAVENOUS
  Filled 2022-12-17: qty 20

## 2022-12-17 MED ORDER — IOHEXOL 350 MG/ML SOLN
75.0000 mL | Freq: Once | INTRAVENOUS | Status: AC | PRN
  Administered 2022-12-17: 75 mL via INTRAVENOUS

## 2022-12-17 MED ORDER — ALBUTEROL SULFATE (2.5 MG/3ML) 0.083% IN NEBU: 2.5000 mg | INHALATION_SOLUTION | RESPIRATORY_TRACT | Status: DC | PRN

## 2022-12-17 MED ORDER — SODIUM CHLORIDE 0.9% FLUSH
3.0000 mL | Freq: Two times a day (BID) | INTRAVENOUS | Status: DC
  Administered 2022-12-17 – 2022-12-21 (×6): 3 mL via INTRAVENOUS

## 2022-12-17 MED ORDER — SODIUM CHLORIDE 0.9 % IV SOLN: 250.0000 mL | INTRAVENOUS | Status: DC | PRN

## 2022-12-17 MED ORDER — ACETAMINOPHEN 325 MG PO TABS
650.0000 mg | ORAL_TABLET | ORAL | Status: DC | PRN
  Administered 2022-12-18 (×2): 650 mg via ORAL
  Filled 2022-12-17 (×2): qty 2

## 2022-12-17 NOTE — ED Triage Notes (Signed)
Pt BIB [facility] from doctors office, after having ST on the monitor but to be seen for sob and fatigue x1 month.   Complex medical hx  Medication noncompliant

## 2022-12-17 NOTE — Hospital Course (Signed)
COVID related dilated cardiomyopathy with LV mural thrombus resulting in CVA presenting today with shortness of breath

## 2022-12-17 NOTE — ED Notes (Signed)
Lab called with critical Lactic 6.5. ED provider has been made aware.

## 2022-12-17 NOTE — ED Notes (Signed)
[place], lab tech called critical trop 89. Dr. Ericka Pontiff has been notified.

## 2022-12-17 NOTE — Progress Notes (Addendum)
   Acute Office Visit  Subjective:    Patient ID: Joel [name], male    DOB: 1995-09-25, 27 y.o.   MRN: [id number]  Chief Complaint  Patient presents with   Cough   Shortness of Breath   Nasal Congestion   PT NEW TO ME _ NOT IN OFFICE IN about a YEAR HPI: Patient is in today for complaints of cough and shortness of breath However pt has been having dyspnea and orthopnea for the past month with worsening symptoms over the past 2 weeks Denies fever, congestion PT HAS A HISTORY OF CHF, DILATED CARDIOMYOPATHY, LV THROMBUS WITHOUT MI , HYPERTENSION, HYPERLIPIDEMIA  AND STOPPED ALL MEDICATIONS OVER 8 MONTHS AGO (INCLUDING COUMADIN, SPIRONOLACTONE, COREG, ENTRESTO., )  He states he was 'feeling better' and just stopped because he felt like it Says he does have some lasix and takes one every few days when his swelling is bad in feet/ankles He has not followed up with cardiology or coumadin clinic in over a year either Bp is extremely elevated in office --- pt states that over the past month he has had some right sided chest pain but none in the past few days He is short of breath here in office however   Pt with history of prior drug use -- pt denies any type of drug use at this time No current outpatient medications on file.  No Known Allergies  ROS CONSTITUTIONAL: Negative for chills, fatigue, fever, unintentional weight gain and unintentional weight loss.  E/N/T: Negative for ear pain, nasal congestion and sore throat.  CARDIOVASCULAR: see HPI RESPIRATORY: see HPI GASTROINTESTINAL: he has had some abdominal bloating and fullness MSK: Negative for arthralgias and myalgias.  INTEGUMENTARY: Negative for rash.       Objective:    PHYSICAL EXAM:   BP (!) 180/120   Pulse 84   Temp (!) 97.3 F (36.3 C)   Resp 18   Ht 6' (1.829 m)   Wt (!) 303 lb (137.4 kg)   BMI 41.09 kg/m    GEN: Well nourished, well developed, anxious -  Cardiac: slightly tachycardic with normal  rhythm; no murmurs, rubs, or gallops,- minimal edema noted Respiratory:  slightly tachypneic - normal breath sounds with no rales, rhonchi, wheezes or rubs GI: normal bowel sounds, has generalized abdominal tenderness Skin: warm and dry, no rash  Neuro:  Alert and Oriented x 3,  - CN II-Xii grossly intact  EKG moderate LVH, tachycardia - no acute changes     Assessment & Plan:   Noncompliant patient Shortness of breath -     POC COVID-19 BinaxNow -     EKG 12-Lead OXYGEN ADMINISTERED IN OFFICE Heart failure, systolic, chronic (HCC)  Dilated cardiomyopathy (HCC)  Primary hypertension   PT SENT BY EMS TO ED FOR FURTHER EVALUATION AND TREATMENT  Follow-up: Return for after ED/hospital evaluation.  An After Visit Summary was printed and given to the patient.  Jettie Pagan [facility] [phone]

## 2022-12-17 NOTE — ED Notes (Signed)
ED TO INPATIENT HANDOFF REPORT  ED Nurse Name and Phone #: Orchid [phone] S Name/Age/Gender Joel [name] 27 y.o. male Room/Bed: 023C/023C  Code Status   Code Status: Full Code  Home/SNF/Other Home Patient oriented to: self, place, time, and situation Is this baseline? Yes   Triage Complete: Triage complete  Chief Complaint Acute systolic (congestive) heart failure (HCC) [I50.21]  Triage Note Pt BIB [facility] from doctors office, after having ST on the monitor but to be seen for sob and fatigue x1 month.   Complex medical hx  Medication noncompliant    Allergies No Known Allergies  Level of Care/Admitting Diagnosis ED Disposition     ED Disposition  Admit   Condition  --   Comment  Hospital Area: [facility] [[id number]]  Level of Care: Telemetry Cardiac [[id number]]  May admit patient to [facility] or [facility] if equivalent level of care is available:: No  Covid Evaluation: Confirmed COVID Negative  Diagnosis: Acute systolic (congestive) heart failure [facility]) [[id number]]  Admitting Physician: Gery Pray [[id number]]  Attending Physician: Gery Pray [[id number]]  Certification:: I certify this patient will need inpatient services for at least 2 midnights  Estimated Length of Stay: 2          B Medical/Surgery History Past Medical History:  Diagnosis Date   Anxiety    Cerebral embolism with cerebral infarction 04/28/2020   Chronic systolic CHF 04/24/2020   Echo at [facility] 04/2020: EF < 20, mild to mod MR, LV thrombus   Depression    Dilated cardiomyopathy (HCC)    History of COVID-19    Hypertension    LV (left ventricular) mural thrombus    Past Surgical History:  Procedure Laterality Date   TONSILLECTOMY       A IV Location/Drains/Wounds Patient Lines/Drains/Airways Status     Active Line/Drains/Airways     Name Placement date Placement time Site Days   Peripheral IV 12/17/22 20 G 1.88" Right Antecubital  12/17/22  1830  Antecubital  less than 1            Intake/Output Last 24 hours  Intake/Output Summary (Last 24 hours) at 12/17/2022 2238 Last data filed at 12/17/2022 1901 Gross per 24 hour  Intake 500 ml  Output --  Net 500 ml    Labs/Imaging Results for orders placed or performed during the hospital encounter of 12/17/22 (from the past 48 hour(s))  CBG monitoring, ED     Status: None   Collection Time: 12/17/22  6:12 PM  Result Value Ref Range   Glucose-Capillary 94 70 - 99 mg/dL    Comment: Glucose reference range applies only to samples taken after fasting for at least 8 hours.  Basic metabolic panel     Status: Abnormal   Collection Time: 12/17/22  6:39 PM  Result Value Ref Range   Sodium 138 135 - 145 mmol/L   Potassium 4.3 3.5 - 5.1 mmol/L   Chloride 106 98 - 111 mmol/L   CO2 16 (L) 22 - 32 mmol/L   Glucose, Bld 80 70 - 99 mg/dL    Comment: Glucose reference range applies only to samples taken after fasting for at least 8 hours.   BUN 12 6 - 20 mg/dL   Creatinine, Ser 3.24 (H) 0.61 - 1.24 mg/dL   Calcium 9.0 8.9 - 40.1 mg/dL   GFR, Estimated >02 >72 mL/min    Comment: (NOTE) Calculated using the CKD-EPI Creatinine Equation (2021)    Anion gap  16 (H) 5 - 15    Comment: Performed at [facility], [address], [place], [place] [address]  Brain natriuretic peptide     Status: Abnormal   Collection Time: 12/17/22  6:39 PM  Result Value Ref Range   B Natriuretic Peptide 510.8 (H) 0.0 - 100.0 pg/mL    Comment: Performed at [facility], [address], [place], [place] [address]  CBC with Differential     Status: Abnormal   Collection Time: 12/17/22  6:39 PM  Result Value Ref Range   WBC 10.7 (H) 4.0 - 10.5 K/uL   RBC 5.17 4.22 - 5.81 MIL/uL   Hemoglobin 16.8 13.0 - 17.0 g/dL   HCT 09.8 11.9 - 14.7 %   MCV 96.7 80.0 - 100.0 fL   MCH 32.5 26.0 - 34.0 pg   MCHC 33.6 30.0 - 36.0 g/dL   RDW 82.9 (H) 56.2 - 13.0 %   Platelets 291 150 - 400 K/uL    nRBC 0.2 0.0 - 0.2 %   Neutrophils Relative % 72 %   Neutro Abs 7.8 (H) 1.7 - 7.7 K/uL   Lymphocytes Relative 19 %   Lymphs Abs 2.0 0.7 - 4.0 K/uL   Monocytes Relative 6 %   Monocytes Absolute 0.6 0.1 - 1.0 K/uL   Eosinophils Relative 1 %   Eosinophils Absolute 0.1 0.0 - 0.5 K/uL   Basophils Relative 1 %   Basophils Absolute 0.1 0.0 - 0.1 K/uL   Immature Granulocytes 1 %   Abs Immature Granulocytes 0.06 0.00 - 0.07 K/uL    Comment: Performed at [facility], [address] [place], [place] [address]  Troponin I (High Sensitivity)     Status: Abnormal   Collection Time: 12/17/22  6:39 PM  Result Value Ref Range   Troponin I (High Sensitivity) 67 (H) <18 ng/L    Comment: (NOTE) Elevated high sensitivity troponin I (hsTnI) values and significant  changes across serial measurements may suggest ACS but many other  chronic and acute conditions are known to elevate hsTnI results.  Refer to the "Links" section for chest pain algorithms and additional  guidance. Performed at [facility], [address], [place], [place] [address]   Lactic acid, plasma     Status: Abnormal   Collection Time: 12/17/22  6:39 PM  Result Value Ref Range   Lactic Acid, Venous 6.5 (HH) 0.5 - 1.9 mmol/L    Comment: CRITICAL RESULT CALLED TO, READ BACK BY AND VERIFIED WITH S Dravyn Severs,RN 1945 12/17/2022 WBOND Performed at [facility], [address], [place], [place] [address]   I-stat chem 8, ED (not at [facility], [facility] or [facility])     Status: Abnormal   Collection Time: 12/17/22  6:47 PM  Result Value Ref Range   Sodium 136 135 - 145 mmol/L   Potassium 4.0 3.5 - 5.1 mmol/L   Chloride 109 98 - 111 mmol/L   BUN 13 6 - 20 mg/dL   Creatinine, Ser 1.32 (H) 0.61 - 1.24 mg/dL   Glucose, Bld 79 70 - 99 mg/dL    Comment: Glucose reference range applies only to samples taken after fasting for at least 8 hours.   Calcium, Ion 0.93 (L) 1.15 - 1.40 mmol/L   TCO2 20 (L) 22 - 32 mmol/L   Hemoglobin 16.7 13.0  - 17.0 g/dL   HCT 44.0 10.2 - 72.5 %  Urinalysis, Routine w reflex microscopic -Urine, Clean Catch  Status: None   Collection Time: 12/17/22  7:14 PM  Result Value Ref Range   Color, Urine YELLOW YELLOW   APPearance CLEAR CLEAR   Specific Gravity, Urine 1.010 1.005 - 1.030   pH 7.0 5.0 - 8.0   Glucose, UA NEGATIVE NEGATIVE mg/dL   Hgb urine dipstick NEGATIVE NEGATIVE   Bilirubin Urine NEGATIVE NEGATIVE   Ketones, ur NEGATIVE NEGATIVE mg/dL   Protein, ur NEGATIVE NEGATIVE mg/dL   Nitrite NEGATIVE NEGATIVE   Leukocytes,Ua NEGATIVE NEGATIVE    Comment: Performed at [facility], [address], [place], [place] [address]  Rapid urine drug screen (hospital performed)     Status: Abnormal   Collection Time: 12/17/22  7:14 PM  Result Value Ref Range   Opiates NONE DETECTED NONE DETECTED   Cocaine NONE DETECTED NONE DETECTED   Benzodiazepines NONE DETECTED NONE DETECTED   Amphetamines POSITIVE (A) NONE DETECTED   Tetrahydrocannabinol POSITIVE (A) NONE DETECTED   Barbiturates NONE DETECTED NONE DETECTED    Comment: (NOTE) DRUG SCREEN FOR MEDICAL PURPOSES ONLY.  IF CONFIRMATION IS NEEDED FOR ANY PURPOSE, NOTIFY LAB WITHIN 5 DAYS.  LOWEST DETECTABLE LIMITS FOR URINE DRUG SCREEN Drug Class                     Cutoff (ng/mL) Amphetamine and metabolites    1000 Barbiturate and metabolites    200 Benzodiazepine                 200 Opiates and metabolites        300 Cocaine and metabolites        300 THC                            50 Performed at [facility], [address], [place], [place] [address]   Lactic acid, plasma     Status: None   Collection Time: 12/17/22  8:33 PM  Result Value Ref Range   Lactic Acid, Venous 1.2 0.5 - 1.9 mmol/L    Comment: Performed at [facility], [address], [place], [place] [address]  Troponin I (High Sensitivity)     Status: Abnormal   Collection Time: 12/17/22  8:33 PM  Result Value Ref Range   Troponin I (High  Sensitivity) 89 (H) <18 ng/L    Comment: RESULT CALLED TO, READ BACK BY AND VERIFIED WITH S. Betsy Rosello, RN. 2128 12/17/22. LPAIT (NOTE) Elevated high sensitivity troponin I (hsTnI) values and significant  changes across serial measurements may suggest ACS but many other  chronic and acute conditions are known to elevate hsTnI results.  Refer to the "Links" section for chest pain algorithms and additional  guidance. Performed at [facility], [address], [place], [place] [address]   Protime-INR     Status: None   Collection Time: 12/17/22  8:33 PM  Result Value Ref Range   Prothrombin Time 13.9 11.4 - 15.2 seconds   INR 1.1 0.8 - 1.2    Comment: (NOTE) INR goal varies based on device and disease states. Performed at [facility], [address], [place], [place] [address]    CT Angio Chest PE W and/or Wo Contrast  Result Date: 12/17/2022 CLINICAL DATA:  Tachycardia, hemoptysis, tachypnea, pleuritic chest pain EXAM: CT ANGIOGRAPHY CHEST WITH CONTRAST TECHNIQUE: Multidetector CT imaging of the chest was performed using the standard protocol during bolus administration of intravenous  contrast. Multiplanar CT image reconstructions and MIPs were obtained to evaluate the vascular anatomy. RADIATION DOSE REDUCTION: This exam was performed according to the departmental dose-optimization program which includes automated exposure control, adjustment of the mA and/or kV according to patient size and/or use of iterative reconstruction technique. CONTRAST:  75mL OMNIPAQUE IOHEXOL 350 MG/ML SOLN COMPARISON:  04/23/2020 FINDINGS: Cardiovascular: SVC patent. Mild cardiomegaly predominantly left-sided. No pericardial effusion. The RV is nondilated. Satisfactory opacification of pulmonary arteries noted, and there is no evidence of pulmonary emboli. Incomplete opacification of the thoracic aorta, without aneurysm. Mediastinum/Nodes: No mass. 1.1 cm AP window lymph node stable since previous. No  hilar adenopathy. Lungs/Pleura: Trace left pleural effusion. No pneumothorax. Extensive ground-glass opacities throughout both lower lobes and posterior upper lobes right greater than left, new since previous. Upper Abdomen: No acute findings. Musculoskeletal: No chest wall abnormality. No acute or significant osseous findings. Review of the MIP images confirms the above findings. IMPRESSION: 1. Negative for acute PE or thoracic aortic dissection. 2. Extensive bilateral ground-glass opacities, right greater than left, with trace left pleural effusion. Considerations include asymmetric edema versus infectious/inflammatory process. 3. Mild cardiomegaly. Electronically Signed   By: Corlis Leak M.D.   On: 12/17/2022 20:22   DG Chest 2 View  Result Date: 12/17/2022 CLINICAL DATA:  Shortness of breath for a month EXAM: CHEST - 2 VIEW COMPARISON:  X-ray 04/24/2020 FINDINGS: Underinflation with bronchovascular crowding. Mild peribronchial thickening. No consolidation, pneumothorax, effusion or edema. Overlapping cardiac leads. Normal cardiopericardial silhouette. IMPRESSION: Underinflation.  Mild peribronchial thickening. Electronically Signed   By: Karen Kays M.D.   On: 12/17/2022 18:22    Pending Labs Unresulted Labs (From admission, onward)     Start     Ordered   12/18/22 0500  CBC with Differential/Platelet  Tomorrow morning,   R        12/17/22 2224   12/18/22 0500  Magnesium  Tomorrow morning,   R        12/17/22 2224   12/17/22 2222  Comprehensive metabolic panel  Once,   R        12/17/22 2224   12/17/22 1809  Blood Culture (routine x 2)  (Undifferentiated presentation (screening labs and basic nursing orders))  BLOOD CULTURE X 2,   STAT      12/17/22 1809            Vitals/Pain Today's Vitals   12/17/22 2100 12/17/22 2114 12/17/22 2146 12/17/22 2230  BP: (!) 175/121 (!) 190/130 (!) 170/112 (!) 168/136  Pulse: (!) 120  (!) 122 (!) 122  Resp:   (!) 25   Temp:   99.3 F (37.4 C)    TempSrc:   Oral   SpO2: 94%  96% 97%  Weight:      Height:        Isolation Precautions No active isolations  Medications Medications  albuterol (VENTOLIN HFA) 108 (90 Base) MCG/ACT inhaler 2 puff (has no administration in time range)  sodium chloride flush (NS) 0.9 % injection 3 mL (has no administration in time range)  sodium chloride flush (NS) 0.9 % injection 3 mL (has no administration in time range)  0.9 %  sodium chloride infusion (has no administration in time range)  acetaminophen (TYLENOL) tablet 650 mg (has no administration in time range)  ondansetron (ZOFRAN) injection 4 mg (has no administration in time range)  furosemide (LASIX) injection 40 mg (has no administration in time range)  lactated ringers bolus 500 mL (0 mLs Intravenous  Stopped 12/17/22 1901)  cefTRIAXone (ROCEPHIN) 2 g in sodium chloride 0.9 % 100 mL IVPB (0 g Intravenous Stopped 12/17/22 2126)  iohexol (OMNIPAQUE) 350 MG/ML injection 75 mL (75 mLs Intravenous Contrast Given 12/17/22 1958)  furosemide (LASIX) injection 80 mg (80 mg Intravenous Given 12/17/22 2147)    Mobility walks     Focused Assessments Cardiac Assessment Handoff:  Cardiac Rhythm: Sinus tachycardia No results found for: "CKTOTAL", "CKMB", "CKMBINDEX", "TROPONINI" No results found for: "DDIMER" Does the Patient currently have chest pain? No    R Recommendations: See Admitting Provider Note  Report given to:   Additional Notes: given Lasix 80 mg, good urine output, has been tachycardic since arrival to ED in the 120s, elevated BP with narrow pressures

## 2022-12-17 NOTE — H&P (Incomplete)
PCP:   Blane Ohara, MD   Chief Complaint:  Shortness of breath  HPI: This is a 27 year old male with past medical history of COVID related dilated cardiomyopathy with LV mural thrombus resulting in CVA in 2021.  EF at that time was <20%. in the interim patient's EF has increased to 45% on echo done 9/23.  The patient had significant his use of methamphetamines at the time.  Approximately 9 months ago the patient discontinued all his medications and stopped cardiology follow-up.  Per patient for approximately a month that he decided to shortness of breath that has been progressive.  Approximately 7 days ago he developed orthopnea.  He has a persistent cough and last week had blood changed sputum.  Had some weight gain which she feels mostly on his stomach.  He denies fever but endorses chills/has only diaphoretic a few times.  He denies chest pains, palpitation, nausea, vomiting.  He endorses increased p.o. fluid intake because his mouth is dry because of his cough.  The patient is a weekend drinker, drinking approximately 12-15 shot rum/coke on weekends.  When he drinks, he may occasionally do drugs.  He states he last did cocaine approximately 2 weeks ago.  He uses marijuana, denies any use of crystal meth.  He does not smoke but he vapes quite a bit.  He came to the ER because of his worsening shortness of breath.  In the ER patient CTA chest shows 2.  Extensive bilateral ground-glass opacities, right greater than left, with trace left pleural effusion. Considerations include asymmetric edema versus infectious/inflammatory process. 3. Mild cardiomegaly. BNP 510.8.  Initial lactic acid 6.5, repeat 1.2.  Creatinine 1.36, baseline creatinine 1.02 last done 03/07/2022.  EKG sinus tach 127.  QTc 537.  Patient's UDS positive for amphetamines and marijuana. Patient given 80 mg IV Lasix.  Cardiologist on-call contacted by EDP. Presenting blood pressure 180/121, HR up to 129, RR up to 24, afebrile  Patient's  sister at bedside.  Review of Systems:  Per HPI  Past Medical History: Past Medical History:  Diagnosis Date   Anxiety    Cerebral embolism with cerebral infarction 04/28/2020   Chronic systolic CHF 04/24/2020   Echo at [facility] 04/2020: EF < 20, mild to mod MR, LV thrombus   Depression    Dilated cardiomyopathy (HCC)    History of COVID-19    Hypertension    LV (left ventricular) mural thrombus    Past Surgical History:  Procedure Laterality Date   TONSILLECTOMY      Medications: Prior to Admission medications   Medication Sig Start Date End Date Taking? Authorizing Provider  albuterol (PROVENTIL) (2.5 MG/3ML) 0.083% nebulizer solution Take 2.5 mg by nebulization every 6 (six) hours as needed for wheezing or shortness of breath.   Yes [provider]  hydrOXYzine (ATARAX) 50 MG tablet Take 100 mg by mouth daily as needed for anxiety or itching.   Yes [provider]    Allergies:  No Known Allergies  Social History:  reports that he has quit smoking. His smoking use included e-cigarettes. His smokeless tobacco use includes chew. He reports that he does not currently use alcohol. He reports that he does not use drugs.  Family History: Family History  Problem Relation Age of Onset   Diabetes Mother    Diabetes Paternal Grandfather    Heart failure Neg Hx     Physical Exam: Vitals:   12/17/22 2100 12/17/22 2114 12/17/22 2146 12/17/22 2230  BP: Marland Kitchen)  175/121 (!) 190/130 (!) 170/112 (!) 168/136  Pulse: (!) 120  (!) 122 (!) 122  Resp:   (!) 25   Temp:   99.3 F (37.4 C)   TempSrc:   Oral   SpO2: 94%  96% 97%  Weight:      Height:        General:  Alert and oriented times three, well developed and nourished, no acute distress Eyes: Pink conjunctiva, no scleral icterus ENT: Moist oral mucosa, neck supple, no thyromegaly Lungs: clear to ascultation, no wheeze, no crackles, no use of accessory muscles Cardiovascular: Tachycardia regular rate  and rhythm, no regurgitation,  Abdomen: soft, positive BS, non-tender, non-distended, no organomegaly, not an acute abdomen GU: not examined Neuro: CN II - XII grossly intact, sensation intact Musculoskeletal: strength 5/5 all extremities, no clubbing, cyanosis or edema Skin: no rash, no subcutaneous crepitation, no decubitus Psych: Somewhat anxious but appropriate patient   Labs on Admission:  Recent Labs    12/17/22 1839 12/17/22 1847  NA 138 136  K 4.3 4.0  CL 106 109  CO2 16*  --   GLUCOSE 80 79  BUN 12 13  CREATININE 1.28* 1.30*  CALCIUM 9.0  --     Radiological Exams on Admission: CT Angio Chest PE W and/or Wo Contrast  Result Date: 12/17/2022 CLINICAL DATA:  Tachycardia, hemoptysis, tachypnea, pleuritic chest pain EXAM: CT ANGIOGRAPHY CHEST WITH CONTRAST TECHNIQUE: Multidetector CT imaging of the chest was performed using the standard protocol during bolus administration of intravenous contrast. Multiplanar CT image reconstructions and MIPs were obtained to evaluate the vascular anatomy. RADIATION DOSE REDUCTION: This exam was performed according to the departmental dose-optimization program which includes automated exposure control, adjustment of the mA and/or kV according to patient size and/or use of iterative reconstruction technique. CONTRAST:  75mL OMNIPAQUE IOHEXOL 350 MG/ML SOLN COMPARISON:  04/23/2020 FINDINGS: Cardiovascular: SVC patent. Mild cardiomegaly predominantly left-sided. No pericardial effusion. The RV is nondilated. Satisfactory opacification of pulmonary arteries noted, and there is no evidence of pulmonary emboli. Incomplete opacification of the thoracic aorta, without aneurysm. Mediastinum/Nodes: No mass. 1.1 cm AP window lymph node stable since previous. No hilar adenopathy. Lungs/Pleura: Trace left pleural effusion. No pneumothorax. Extensive ground-glass opacities throughout both lower lobes and posterior upper lobes right greater than left, new since  previous. Upper Abdomen: No acute findings. Musculoskeletal: No chest wall abnormality. No acute or significant osseous findings. Review of the MIP images confirms the above findings. IMPRESSION: 1. Negative for acute PE or thoracic aortic dissection. 2. Extensive bilateral ground-glass opacities, right greater than left, with trace left pleural effusion. Considerations include asymmetric edema versus infectious/inflammatory process. 3. Mild cardiomegaly. Electronically Signed   By: Corlis Leak M.D.   On: 12/17/2022 20:22   DG Chest 2 View  Result Date: 12/17/2022 CLINICAL DATA:  Shortness of breath for a month EXAM: CHEST - 2 VIEW COMPARISON:  X-ray 04/24/2020 FINDINGS: Underinflation with bronchovascular crowding. Mild peribronchial thickening. No consolidation, pneumothorax, effusion or edema. Overlapping cardiac leads. Normal cardiopericardial silhouette. IMPRESSION: Underinflation.  Mild peribronchial thickening. Electronically Signed   By: Karen Kays M.D.   On: 12/17/2022 18:22    Assessment/Plan Present on Admission:  Acute on chronic systolic (congestive) heart failure (HCC)  History of viral induced dilated cardiomyopathy  (HCC) -May be precipitated by uncontrolled hypertension and cocaine use -CHF order set initiated -IV Lasix every 12, strict I's and O, daily weights -2D echo in a.m. -Oxygen if needed Cardiologist Dr Wilkie Aye contacted by  EDP.  -. Chronic systolic CHF: Nonischemic cardiomyopathy.   - Echo (10/21): EF < 20%, LV thrombus.  - Cardiac MRI (10/21): Severe LV dilation with diffuse hypokinesis, EF 15%, small LV apical thrombus, moderate RV dilation with EF 19%, LGE pattern concerning for myocarditis, with diffuse patchy mid-wall LGE. - Echo (3/22): EF 35% with global hypokinesis, mild LV dilation, mildly decreased RV systolic function. No LV thrombus. - Echo (9/22): EF 45% with mild LVH, diffuse hypokinesis, normal  Uncontrolled hypertension -Patient's UDS negative for  cocaine.  Lopressor as needed.  Avoid calcium channel blocker as patient is CHF -Monitor -Hydralazine 25 mg PO TID -Nitropaste ordered  AKI -Monitor with IV Lasix use.  No fluid hydration as patient is CHF. Avoid ACI w/ AKI -BMP in a.m.  Lactic acidosis -?lab error.  Lites repleted less than 3 hours for confirmation was 1.2  Prolonged QTC -Potassium 4.3.  Will add magnesium level -EKG in a.m.  Heavy weekend alcohol use -CIWA protocol initiated -Thiamine, folate, multivitamin -As needed Ativan  Crystal meth and THC use ADHD -ADHD previously treated because of cardiomyopathy.  Per patient he uses THC as self-medication.  He denies using meth.  Endorses occasional cocaine and regular weekend alcohol use.  Family very concerned as patient lives with the 28 year old parents.  Psych consult and substance abuse evaluation requested   History of CVA -Residual deficits.  Due to mural thrombus in 2021.  Thought to be cardioembolic in nature   Nicholous Girgenti 12/17/2022, 10:47 PM

## 2022-12-17 NOTE — ED Provider Notes (Signed)
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  The patient reports he has had shortness of breath and fatigue over the last several weeks.  He also reports a history of CHF and has not been compliant with his medications.  He endorses a remote history of methamphetamine use but denies any recent use.  He endorses a productive cough with hemoptysis streaking.  Denies history of PE/DVT.  Denies recent surgery or immobilization.  He endorses discomfort with deep inspiration.  He denies exertional chest pain.  He presented to his PCPs office who called EMS for transport to the hospital due to a reported oxygen requirement.  EMS report the patient was hemodynamically stable in transit.  Past Medical History:  Diagnosis Date   Anxiety    Cerebral embolism with cerebral infarction 04/28/2020   Chronic systolic CHF 04/24/2020   Echo at [facility] 04/2020: EF < 20, mild to mod MR, LV thrombus   Depression    Dilated cardiomyopathy (HCC)    History of COVID-19    Hypertension    LV (left ventricular) mural thrombus     Past Surgical History:  Procedure Laterality Date   TONSILLECTOMY       Social History   Tobacco Use   Smoking status: Former    Types: E-cigarettes   Smokeless tobacco: Current    Types: Chew   Tobacco comments:    quit around June 2021  Vaping Use   Vaping Use: Former   Substances: Nicotine  Substance Use Topics   Alcohol use: Yes    Alcohol/week: 7.0 standard drinks of alcohol    Types: 7 Cans of beer per week    Comment: Patient used to drink occasionally a beer, but stopped when started lexapro   Drug use: Yes    Types: Marijuana      Review of Systems  A complete ROS was performed with pertinent positives/negatives  noted in the HPI.   Vitals:   12/18/22 0541 12/18/22 0751  BP: (!) 176/119 (!) 164/122  Pulse: (!) 115   Resp:  (!) 22  Temp:  98.4 F (36.9 C)  SpO2:      Physical Exam Vitals and nursing note reviewed.  Constitutional:      General: He is not in acute distress.    Appearance: He is well-developed. He is ill-appearing.  Cardiovascular:     Rate and Rhythm: Regular rhythm. Tachycardia present.     Pulses: Normal pulses.     Heart sounds: Normal heart sounds. No murmur heard.    No friction rub. No gallop.  Pulmonary:     Effort: Pulmonary effort is normal. No respiratory distress.     Breath sounds: Normal breath sounds. No stridor. No wheezing, rhonchi or rales.  Abdominal:     General: Abdomen is flat. There is no distension.     Palpations: Abdomen is soft.     Tenderness: There is no abdominal tenderness. There is no guarding or rebound.  Musculoskeletal:        General: No swelling.  Skin:    General: Skin is warm and dry.     Capillary Refill: Capillary refill takes less than 2 seconds.  Neurological:     Mental Status: He is alert.     Procedures  MDM:  Imaging/radiology results:  CT Angio Chest PE W and/or Wo Contrast  Result Date: 12/17/2022 CLINICAL DATA:  Tachycardia, hemoptysis, tachypnea, pleuritic chest pain EXAM: CT ANGIOGRAPHY CHEST WITH CONTRAST TECHNIQUE: Multidetector CT imaging of the chest was performed using the standard protocol during bolus administration of intravenous contrast. Multiplanar CT image reconstructions and MIPs were obtained to evaluate the vascular anatomy. RADIATION DOSE REDUCTION: This exam was performed according to the departmental dose-optimization program which includes automated exposure control, adjustment of the mA and/or kV according to patient size and/or use of iterative reconstruction technique. CONTRAST:  75mL OMNIPAQUE IOHEXOL 350 MG/ML SOLN COMPARISON:  04/23/2020 FINDINGS: Cardiovascular: SVC patent. Mild  cardiomegaly predominantly left-sided. No pericardial effusion. The RV is nondilated. Satisfactory opacification of pulmonary arteries noted, and there is no evidence of pulmonary emboli. Incomplete opacification of the thoracic aorta, without aneurysm. Mediastinum/Nodes: No mass. 1.1 cm AP window lymph node stable since previous. No hilar adenopathy. Lungs/Pleura: Trace left pleural effusion. No pneumothorax. Extensive ground-glass opacities throughout both lower lobes and posterior upper lobes right greater than left, new since previous. Upper Abdomen: No acute findings. Musculoskeletal: No chest wall abnormality. No acute or significant osseous findings. Review of the MIP images confirms the above findings. IMPRESSION: 1. Negative for acute PE or thoracic aortic dissection. 2. Extensive bilateral ground-glass opacities, right greater than left, with trace left pleural effusion. Considerations include asymmetric edema versus infectious/inflammatory process. 3. Mild cardiomegaly. Electronically Signed   By: Corlis Leak M.D.   On: 12/17/2022 20:22   DG Chest 2 View  Result Date: 12/17/2022 CLINICAL DATA:  Shortness of breath for a month EXAM: CHEST - 2 VIEW COMPARISON:  X-ray 04/24/2020 FINDINGS: Underinflation with bronchovascular crowding. Mild peribronchial thickening. No consolidation, pneumothorax, effusion or edema. Overlapping cardiac leads. Normal cardiopericardial silhouette. IMPRESSION: Underinflation.  Mild peribronchial thickening. Electronically Signed   By: Karen Kays M.D.   On: 12/17/2022 18:22     EKG results: ECG on my interpretation is normal sinus rhythm and rate, without anatomical ischemia representing STEMI, New-onset Arrhythmia, or ischemic equivalent.    Lab results:  Results for orders placed or performed during the hospital encounter of 12/17/22 (from the past 24 hour(s))  CBG monitoring, ED     Status: None   Collection Time: 12/17/22  6:12 PM  Result Value Ref Range    Glucose-Capillary 94 70 - 99 mg/dL  Basic metabolic panel     Status: Abnormal   Collection Time: 12/17/22  6:39 PM  Result Value Ref Range   Sodium 138 135 - 145 mmol/L   Potassium 4.3 3.5 - 5.1 mmol/L   Chloride 106 98 - 111 mmol/L   CO2 16 (L) 22 - 32 mmol/L   Glucose, Bld 80 70 - 99 mg/dL   BUN 12 6 - 20 mg/dL   Creatinine, Ser 1.19 (H) 0.61 - 1.24 mg/dL   Calcium 9.0 8.9 - 14.7 mg/dL   GFR, Estimated >82 >95 mL/min   Anion gap 16 (H) 5 - 15  Brain natriuretic peptide     Status: Abnormal   Collection Time: 12/17/22  6:39 PM  Result Value Ref Range   B Natriuretic Peptide 510.8 (H) 0.0 - 100.0 pg/mL  CBC with Differential     Status: Abnormal   Collection Time: 12/17/22  6:39 PM  Result Value Ref Range   WBC 10.7 (H) 4.0 - 10.5 K/uL   RBC 5.17 4.22 - 5.81 MIL/uL   Hemoglobin 16.8 13.0 - 17.0 g/dL  HCT 50.0 39.0 - 52.0 %   MCV 96.7 80.0 - 100.0 fL   MCH 32.5 26.0 - 34.0 pg   MCHC 33.6 30.0 - 36.0 g/dL   RDW 16.0 (H) 10.9 - 32.3 %   Platelets 291 150 - 400 K/uL   nRBC 0.2 0.0 - 0.2 %   Neutrophils Relative % 72 %   Neutro Abs 7.8 (H) 1.7 - 7.7 K/uL   Lymphocytes Relative 19 %   Lymphs Abs 2.0 0.7 - 4.0 K/uL   Monocytes Relative 6 %   Monocytes Absolute 0.6 0.1 - 1.0 K/uL   Eosinophils Relative 1 %   Eosinophils Absolute 0.1 0.0 - 0.5 K/uL   Basophils Relative 1 %   Basophils Absolute 0.1 0.0 - 0.1 K/uL   Immature Granulocytes 1 %   Abs Immature Granulocytes 0.06 0.00 - 0.07 K/uL  Troponin I (High Sensitivity)     Status: Abnormal   Collection Time: 12/17/22  6:39 PM  Result Value Ref Range   Troponin I (High Sensitivity) 67 (H) <18 ng/L  Lactic acid, plasma     Status: Abnormal   Collection Time: 12/17/22  6:39 PM  Result Value Ref Range   Lactic Acid, Venous 6.5 (HH) 0.5 - 1.9 mmol/L  Blood Culture (routine x 2)     Status: None (Preliminary result)   Collection Time: 12/17/22  6:39 PM   Specimen: BLOOD RIGHT ARM  Result Value Ref Range   Specimen  Description BLOOD RIGHT ARM    Special Requests      BOTTLES DRAWN AEROBIC AND ANAEROBIC Blood Culture results may not be optimal due to an inadequate volume of blood received in culture bottles   Culture      NO GROWTH < 12 HOURS Performed at [facility], [address], [place], [place] [address]    Report Status PENDING   I-stat chem 8, ED (not at [facility], [facility] or [facility])     Status: Abnormal   Collection Time: 12/17/22  6:47 PM  Result Value Ref Range   Sodium 136 135 - 145 mmol/L   Potassium 4.0 3.5 - 5.1 mmol/L   Chloride 109 98 - 111 mmol/L   BUN 13 6 - 20 mg/dL   Creatinine, Ser 2.02 (H) 0.61 - 1.24 mg/dL   Glucose, Bld 79 70 - 99 mg/dL   Calcium, Ion 5.42 (L) 1.15 - 1.40 mmol/L   TCO2 20 (L) 22 - 32 mmol/L   Hemoglobin 16.7 13.0 - 17.0 g/dL   HCT 70.6 23.7 - 62.8 %  Urinalysis, Routine w reflex microscopic -Urine, Clean Catch     Status: None   Collection Time: 12/17/22  7:14 PM  Result Value Ref Range   Color, Urine YELLOW YELLOW   APPearance CLEAR CLEAR   Specific Gravity, Urine 1.010 1.005 - 1.030   pH 7.0 5.0 - 8.0   Glucose, UA NEGATIVE NEGATIVE mg/dL   Hgb urine dipstick NEGATIVE NEGATIVE   Bilirubin Urine NEGATIVE NEGATIVE   Ketones, ur NEGATIVE NEGATIVE mg/dL   Protein, ur NEGATIVE NEGATIVE mg/dL   Nitrite NEGATIVE NEGATIVE   Leukocytes,Ua NEGATIVE NEGATIVE  Rapid urine drug screen (hospital performed)     Status: Abnormal   Collection Time: 12/17/22  7:14 PM  Result Value Ref Range   Opiates NONE DETECTED NONE DETECTED   Cocaine NONE DETECTED NONE DETECTED   Benzodiazepines NONE DETECTED NONE DETECTED   Amphetamines POSITIVE (A) NONE DETECTED   Tetrahydrocannabinol POSITIVE (A) NONE DETECTED  Barbiturates NONE DETECTED NONE DETECTED  Lactic acid, plasma     Status: None   Collection Time: 12/17/22  8:33 PM  Result Value Ref Range   Lactic Acid, Venous 1.2 0.5 - 1.9 mmol/L  Troponin I (High Sensitivity)     Status: Abnormal   Collection Time:  12/17/22  8:33 PM  Result Value Ref Range   Troponin I (High Sensitivity) 89 (H) <18 ng/L  Protime-INR     Status: None   Collection Time: 12/17/22  8:33 PM  Result Value Ref Range   Prothrombin Time 13.9 11.4 - 15.2 seconds   INR 1.1 0.8 - 1.2  Comprehensive metabolic panel     Status: Abnormal   Collection Time: 12/17/22 11:41 PM  Result Value Ref Range   Sodium 135 135 - 145 mmol/L   Potassium 3.8 3.5 - 5.1 mmol/L   Chloride 102 98 - 111 mmol/L   CO2 22 22 - 32 mmol/L   Glucose, Bld 109 (H) 70 - 99 mg/dL   BUN 12 6 - 20 mg/dL   Creatinine, Ser 1.61 (H) 0.61 - 1.24 mg/dL   Calcium 8.9 8.9 - 09.6 mg/dL   Total Protein 6.7 6.5 - 8.1 g/dL   Albumin 3.7 3.5 - 5.0 g/dL   AST 41 15 - 41 U/L   ALT 67 (H) 0 - 44 U/L   Alkaline Phosphatase 51 38 - 126 U/L   Total Bilirubin 1.4 (H) 0.3 - 1.2 mg/dL   GFR, Estimated >04 >54 mL/min   Anion gap 11 5 - 15  Magnesium     Status: None   Collection Time: 12/17/22 11:41 PM  Result Value Ref Range   Magnesium 2.0 1.7 - 2.4 mg/dL  CBC with Differential/Platelet     Status: Abnormal   Collection Time: 12/18/22  2:51 AM  Result Value Ref Range   WBC 12.1 (H) 4.0 - 10.5 K/uL   RBC 5.12 4.22 - 5.81 MIL/uL   Hemoglobin 16.7 13.0 - 17.0 g/dL   HCT 09.8 11.9 - 14.7 %   MCV 91.2 80.0 - 100.0 fL   MCH 32.6 26.0 - 34.0 pg   MCHC 35.8 30.0 - 36.0 g/dL   RDW 82.9 (H) 56.2 - 13.0 %   Platelets 244 150 - 400 K/uL   nRBC 0.0 0.0 - 0.2 %   Neutrophils Relative % 71 %   Neutro Abs 8.7 (H) 1.7 - 7.7 K/uL   Lymphocytes Relative 20 %   Lymphs Abs 2.4 0.7 - 4.0 K/uL   Monocytes Relative 6 %   Monocytes Absolute 0.7 0.1 - 1.0 K/uL   Eosinophils Relative 1 %   Eosinophils Absolute 0.1 0.0 - 0.5 K/uL   Basophils Relative 1 %   Basophils Absolute 0.1 0.0 - 0.1 K/uL   Immature Granulocytes 1 %   Abs Immature Granulocytes 0.06 0.00 - 0.07 K/uL  Magnesium     Status: None   Collection Time: 12/18/22  2:51 AM  Result Value Ref Range   Magnesium 2.2 1.7 -  2.4 mg/dL  Phosphorus     Status: None   Collection Time: 12/18/22  2:51 AM  Result Value Ref Range   Phosphorus 4.5 2.5 - 4.6 mg/dL  Renal function panel     Status: Abnormal   Collection Time: 12/18/22  8:35 AM  Result Value Ref Range   Sodium 137 135 - 145 mmol/L   Potassium 4.0 3.5 - 5.1 mmol/L   Chloride 104 98 - 111 mmol/L  CO2 19 (L) 22 - 32 mmol/L   Glucose, Bld 124 (H) 70 - 99 mg/dL   BUN 13 6 - 20 mg/dL   Creatinine, Ser 1.61 (H) 0.61 - 1.24 mg/dL   Calcium 9.1 8.9 - 09.6 mg/dL   Phosphorus 4.9 (H) 2.5 - 4.6 mg/dL   Albumin 3.5 3.5 - 5.0 g/dL   GFR, Estimated >04 >54 mL/min   Anion gap 14 5 - 15     Key medications administered in the ER:  Medications  sodium chloride flush (NS) 0.9 % injection 3 mL (3 mLs Intravenous Given 12/18/22 1110)  sodium chloride flush (NS) 0.9 % injection 3 mL (has no administration in time range)  0.9 %  sodium chloride infusion (has no administration in time range)  acetaminophen (TYLENOL) tablet 650 mg (has no administration in time range)  ondansetron (ZOFRAN) injection 4 mg (has no administration in time range)  albuterol (PROVENTIL) (2.5 MG/3ML) 0.083% nebulizer solution 2.5 mg (has no administration in time range)  LORazepam (ATIVAN) tablet 1-4 mg (has no administration in time range)    Or  LORazepam (ATIVAN) injection 1-4 mg (has no administration in time range)  thiamine (VITAMIN B1) tablet 100 mg (100 mg Oral Given 12/18/22 0117)    Or  thiamine (VITAMIN B1) injection 100 mg ( Intravenous See Alternative 12/18/22 0117)  folic acid (FOLVITE) tablet 1 mg (1 mg Oral Given 12/18/22 0117)  multivitamin with minerals tablet 1 tablet (1 tablet Oral Given 12/18/22 0117)  Oral care mouth rinse (has no administration in time range)  metoprolol tartrate (LOPRESSOR) injection 5 mg (5 mg Intravenous Given 12/18/22 0541)  furosemide (LASIX) injection 20 mg (20 mg Intravenous Given 12/18/22 0805)  hydrALAZINE (APRESOLINE) tablet 25 mg (25 mg Oral  Given 12/18/22 0805)  cefTRIAXone (ROCEPHIN) 2 g in sodium chloride 0.9 % 100 mL IVPB (2 g Intravenous New Bag/Given 12/18/22 1110)  carvedilol (COREG) tablet 6.25 mg (6.25 mg Oral Given 12/18/22 0817)  lactated ringers bolus 500 mL (0 mLs Intravenous Stopped 12/17/22 1901)  cefTRIAXone (ROCEPHIN) 2 g in sodium chloride 0.9 % 100 mL IVPB (0 g Intravenous Stopped 12/17/22 2126)  iohexol (OMNIPAQUE) 350 MG/ML injection 75 mL (75 mLs Intravenous Contrast Given 12/17/22 1958)  furosemide (LASIX) injection 80 mg (80 mg Intravenous Given 12/17/22 2147)  LORazepam (ATIVAN) injection 1 mg (1 mg Intravenous Given 12/18/22 0000)  metoprolol tartrate (LOPRESSOR) injection 5 mg (5 mg Intravenous Given 12/18/22 0117)  nitroGLYCERIN (NITROGLYN) 2 % ointment 0.5 inch (0.5 inches Topical Given 12/18/22 0805)    Consults: Cardiology  Medical decision making: -Vital signs stable. Patient afebrile, hemodynamically stable, and non-toxic appearing. -Patient's presentation is most consistent with acute presentation with potential threat to life or bodily function.Marland Kitchen Deyonta Bunner Charon is a 27 y.o. male presenting to the emergency department with shortness of breath.  -Additional history obtained from family at bedside. -Per chart review, patient was last admitted in 2021 for possible acute infarcts related to his dilated cardiomyopathy with LV thrombus.  He was supposed to be on Coumadin. -Initial troponin 67. Second troponin 89. Delta troponin 22. EKG I obtained reveals no anatomical ischemia representing STEMI, new onset arrythmia, or ischemic equivalent.  Feels though ACS is less likely however given complex history I discussed with cardiology who reviewed his workup and feel his presentation is more consistent with volume overload. CXR without evidence of Pneumothorax. No concerns for Pericardial Tamponade on EKG and in light of patients hemodynamic stability doubt this pathology. No pain  related to supine or prone  positions and given EKG doubt Pericarditis.  Due to tachycardia with hemoptysis CT PE obtained and does not show evidence of PE.  Chest x-ray initially concerning for possible pneumonia on my interpretation with his tachycardia and tachypnea sepsis protocol initiated.  Empiric antibiotics given.  As workup progressed, patient's presentation appears more consistent with volume overloaded status.  He is hypertensive, with pulmonary edema seen on CT, BNP is elevated, and he is up on his weight from his last visit in the setting of medication noncompliance.  Lasix has been ordered and will proceed with admission for further evaluation.     Medical Decision Making Amount and/or Complexity of Data Reviewed Labs: ordered. Radiology: ordered. ECG/medicine tests: ordered.  Risk Prescription drug management. Decision regarding hospitalization.     The plan for this patient was discussed with Dr. Jodi Mourning, who voiced agreement and who oversaw evaluation and treatment of this patient.  Marta Lamas, MD Emergency Medicine, PGY-3  Note: Dragon medical dictation software was used in the creation of this note.   Clinical Impression:  1. Acute on chronic systolic congestive heart failure (HCC)         Chase Caller, MD 12/18/22 1124    Blane Ohara, MD 12/20/22 [phone]

## 2022-12-18 ENCOUNTER — Encounter (HOSPITAL_COMMUNITY): Payer: Self-pay | Admitting: Family Medicine

## 2022-12-18 ENCOUNTER — Inpatient Hospital Stay (HOSPITAL_COMMUNITY): Payer: BC Managed Care – PPO

## 2022-12-18 DIAGNOSIS — F1510 Other stimulant abuse, uncomplicated: Secondary | ICD-10-CM

## 2022-12-18 DIAGNOSIS — I5021 Acute systolic (congestive) heart failure: Secondary | ICD-10-CM | POA: Diagnosis not present

## 2022-12-18 DIAGNOSIS — I10 Essential (primary) hypertension: Secondary | ICD-10-CM | POA: Diagnosis not present

## 2022-12-18 DIAGNOSIS — Z6841 Body Mass Index (BMI) 40.0 and over, adult: Secondary | ICD-10-CM

## 2022-12-18 DIAGNOSIS — Z8673 Personal history of transient ischemic attack (TIA), and cerebral infarction without residual deficits: Secondary | ICD-10-CM

## 2022-12-18 DIAGNOSIS — I420 Dilated cardiomyopathy: Secondary | ICD-10-CM | POA: Diagnosis not present

## 2022-12-18 DIAGNOSIS — E782 Mixed hyperlipidemia: Secondary | ICD-10-CM

## 2022-12-18 DIAGNOSIS — E872 Acidosis, unspecified: Secondary | ICD-10-CM

## 2022-12-18 LAB — ECHOCARDIOGRAM COMPLETE
AR max vel: 3.66 cm2
AV Area VTI: 3.55 cm2
AV Area mean vel: 3.3 cm2
AV Mean grad: 2 mmHg
AV Peak grad: 3.1 mmHg
Ao pk vel: 0.88 m/s
Area-P 1/2: 6.88 cm2
Est EF: 15
Height: 70 in
S' Lateral: 6.2 cm
Weight: 4704 [oz_av]

## 2022-12-18 LAB — CBC WITH DIFFERENTIAL/PLATELET
Abs Immature Granulocytes: 0.06 10*3/uL (ref 0.00–0.07)
Basophils Absolute: 0.1 10*3/uL (ref 0.0–0.1)
Basophils Relative: 1 %
Eosinophils Absolute: 0.1 10*3/uL (ref 0.0–0.5)
Eosinophils Relative: 1 %
HCT: 46.7 % (ref 39.0–52.0)
Hemoglobin: 16.7 g/dL (ref 13.0–17.0)
Immature Granulocytes: 1 %
Lymphocytes Relative: 20 %
Lymphs Abs: 2.4 10*3/uL (ref 0.7–4.0)
MCH: 32.6 pg (ref 26.0–34.0)
MCHC: 35.8 g/dL (ref 30.0–36.0)
MCV: 91.2 fL (ref 80.0–100.0)
Monocytes Absolute: 0.7 10*3/uL (ref 0.1–1.0)
Monocytes Relative: 6 %
Neutro Abs: 8.7 10*3/uL — ABNORMAL HIGH (ref 1.7–7.7)
Neutrophils Relative %: 71 %
Platelets: 244 10*3/uL (ref 150–400)
RBC: 5.12 MIL/uL (ref 4.22–5.81)
RDW: 16 % — ABNORMAL HIGH (ref 11.5–15.5)
WBC: 12.1 10*3/uL — ABNORMAL HIGH (ref 4.0–10.5)
nRBC: 0 % (ref 0.0–0.2)

## 2022-12-18 LAB — COMPREHENSIVE METABOLIC PANEL
ALT: 67 U/L — ABNORMAL HIGH (ref 0–44)
AST: 41 U/L (ref 15–41)
Albumin: 3.7 g/dL (ref 3.5–5.0)
Alkaline Phosphatase: 51 U/L (ref 38–126)
Anion gap: 11 (ref 5–15)
BUN: 12 mg/dL (ref 6–20)
CO2: 22 mmol/L (ref 22–32)
Calcium: 8.9 mg/dL (ref 8.9–10.3)
Chloride: 102 mmol/L (ref 98–111)
Creatinine, Ser: 1.36 mg/dL — ABNORMAL HIGH (ref 0.61–1.24)
GFR, Estimated: >60 mL/min (ref >60)
Glucose, Bld: 109 mg/dL — ABNORMAL HIGH (ref 70–99)
Potassium: 3.8 mmol/L (ref 3.5–5.1)
Sodium: 135 mmol/L (ref 135–145)
Total Bilirubin: 1.4 mg/dL — ABNORMAL HIGH (ref 0.3–1.2)
Total Protein: 6.7 g/dL (ref 6.5–8.1)

## 2022-12-18 LAB — HEPATITIS B SURFACE ANTIGEN: Hepatitis B Surface Ag: NONREACTIVE

## 2022-12-18 LAB — RENAL FUNCTION PANEL
Albumin: 3.5 g/dL (ref 3.5–5.0)
Anion gap: 14 (ref 5–15)
BUN: 13 mg/dL (ref 6–20)
CO2: 19 mmol/L — ABNORMAL LOW (ref 22–32)
Calcium: 9.1 mg/dL (ref 8.9–10.3)
Chloride: 104 mmol/L (ref 98–111)
Creatinine, Ser: 1.46 mg/dL — ABNORMAL HIGH (ref 0.61–1.24)
GFR, Estimated: >60 mL/min
Glucose, Bld: 124 mg/dL — ABNORMAL HIGH (ref 70–99)
Phosphorus: 4.9 mg/dL — ABNORMAL HIGH (ref 2.5–4.6)
Potassium: 4 mmol/L (ref 3.5–5.1)
Sodium: 137 mmol/L (ref 135–145)

## 2022-12-18 LAB — PHOSPHORUS: Phosphorus: 4.5 mg/dL (ref 2.5–4.6)

## 2022-12-18 LAB — MAGNESIUM
Magnesium: 2 mg/dL (ref 1.7–2.4)
Magnesium: 2.2 mg/dL (ref 1.7–2.4)

## 2022-12-18 LAB — CULTURE, BLOOD (ROUTINE X 2)

## 2022-12-18 LAB — HIV ANTIBODY (ROUTINE TESTING W REFLEX): HIV Screen 4th Generation wRfx: NONREACTIVE

## 2022-12-18 MED ORDER — CARVEDILOL 6.25 MG PO TABS
6.2500 mg | ORAL_TABLET | Freq: Two times a day (BID) | ORAL | Status: DC
  Administered 2022-12-18 – 2022-12-21 (×7): 6.25 mg via ORAL
  Filled 2022-12-18 (×7): qty 1

## 2022-12-18 MED ORDER — HYDRALAZINE HCL 50 MG PO TABS
50.0000 mg | ORAL_TABLET | Freq: Three times a day (TID) | ORAL | Status: DC
  Administered 2022-12-18 – 2022-12-19 (×2): 50 mg via ORAL
  Filled 2022-12-18 (×2): qty 1

## 2022-12-18 MED ORDER — NICOTINE 21 MG/24HR TD PT24
21.0000 mg | MEDICATED_PATCH | Freq: Every day | TRANSDERMAL | Status: DC
  Administered 2022-12-18 – 2022-12-20 (×3): 21 mg via TRANSDERMAL
  Filled 2022-12-18 (×4): qty 1

## 2022-12-18 MED ORDER — THIAMINE MONONITRATE 100 MG PO TABS
100.0000 mg | ORAL_TABLET | Freq: Every day | ORAL | Status: DC
  Administered 2022-12-18 – 2022-12-21 (×4): 100 mg via ORAL
  Filled 2022-12-18 (×4): qty 1

## 2022-12-18 MED ORDER — LORAZEPAM 1 MG PO TABS
1.0000 mg | ORAL_TABLET | ORAL | Status: DC | PRN
  Administered 2022-12-18 (×2): 2 mg via ORAL
  Filled 2022-12-18 (×2): qty 2

## 2022-12-18 MED ORDER — HYDRALAZINE HCL 25 MG PO TABS
25.0000 mg | ORAL_TABLET | Freq: Three times a day (TID) | ORAL | Status: DC
  Administered 2022-12-18 (×2): 25 mg via ORAL
  Filled 2022-12-18 (×2): qty 1

## 2022-12-18 MED ORDER — PERFLUTREN LIPID MICROSPHERE
1.0000 mL | INTRAVENOUS | Status: AC | PRN
  Administered 2022-12-18: 2 mL via INTRAVENOUS

## 2022-12-18 MED ORDER — SODIUM CHLORIDE 0.9 % IV SOLN
2.0000 g | INTRAVENOUS | Status: DC
  Administered 2022-12-18: 2 g via INTRAVENOUS
  Filled 2022-12-18 (×2): qty 20

## 2022-12-18 MED ORDER — NITROGLYCERIN 2 % TD OINT
0.5000 [in_us] | TOPICAL_OINTMENT | Freq: Once | TRANSDERMAL | Status: AC
  Administered 2022-12-18: 0.5 [in_us] via TOPICAL
  Filled 2022-12-18: qty 1

## 2022-12-18 MED ORDER — METOPROLOL TARTRATE 5 MG/5ML IV SOLN
5.0000 mg | Freq: Once | INTRAVENOUS | Status: AC
  Administered 2022-12-18: 5 mg via INTRAVENOUS
  Filled 2022-12-18: qty 5

## 2022-12-18 MED ORDER — METOPROLOL TARTRATE 5 MG/5ML IV SOLN
5.0000 mg | Freq: Four times a day (QID) | INTRAVENOUS | Status: DC | PRN
  Administered 2022-12-18 (×2): 5 mg via INTRAVENOUS
  Filled 2022-12-18 (×2): qty 5

## 2022-12-18 MED ORDER — FUROSEMIDE 10 MG/ML IJ SOLN
40.0000 mg | Freq: Two times a day (BID) | INTRAMUSCULAR | Status: DC
  Administered 2022-12-18 – 2022-12-20 (×5): 40 mg via INTRAVENOUS
  Filled 2022-12-18 (×6): qty 4

## 2022-12-18 MED ORDER — FOLIC ACID 1 MG PO TABS
1.0000 mg | ORAL_TABLET | Freq: Every day | ORAL | Status: DC
  Administered 2022-12-18 – 2022-12-21 (×4): 1 mg via ORAL
  Filled 2022-12-18 (×4): qty 1

## 2022-12-18 MED ORDER — METOPROLOL TARTRATE 25 MG PO TABS: 25.0000 mg | ORAL_TABLET | Freq: Two times a day (BID) | ORAL | Status: DC

## 2022-12-18 MED ORDER — ENOXAPARIN SODIUM 40 MG/0.4ML IJ SOSY
40.0000 mg | PREFILLED_SYRINGE | INTRAMUSCULAR | Status: DC
  Administered 2022-12-18 – 2022-12-20 (×3): 40 mg via SUBCUTANEOUS
  Filled 2022-12-18 (×4): qty 0.4

## 2022-12-18 MED ORDER — THIAMINE HCL 100 MG/ML IJ SOLN: 100.0000 mg | Freq: Every day | INTRAMUSCULAR | Status: DC

## 2022-12-18 MED ORDER — DIGOXIN 125 MCG PO TABS
0.1250 mg | ORAL_TABLET | Freq: Every day | ORAL | Status: DC
  Administered 2022-12-18 – 2022-12-21 (×4): 0.125 mg via ORAL
  Filled 2022-12-18 (×4): qty 1

## 2022-12-18 MED ORDER — FUROSEMIDE 10 MG/ML IJ SOLN
20.0000 mg | Freq: Two times a day (BID) | INTRAMUSCULAR | Status: DC
  Administered 2022-12-18: 20 mg via INTRAVENOUS
  Filled 2022-12-18: qty 2

## 2022-12-18 MED ORDER — LORAZEPAM 2 MG/ML IJ SOLN
1.0000 mg | INTRAMUSCULAR | Status: DC | PRN
  Administered 2022-12-20: 1 mg via INTRAVENOUS
  Filled 2022-12-18: qty 1

## 2022-12-18 MED ORDER — ORAL CARE MOUTH RINSE: 15.0000 mL | OROMUCOSAL | Status: DC | PRN

## 2022-12-18 MED ORDER — ADULT MULTIVITAMIN W/MINERALS CH
1.0000 | ORAL_TABLET | Freq: Every day | ORAL | Status: DC
  Administered 2022-12-18 – 2022-12-21 (×4): 1 via ORAL
  Filled 2022-12-18 (×4): qty 1

## 2022-12-18 NOTE — Consult Note (Signed)
[facility] Consult Evaluation  Service Date: December 18, 2022 LOS:  LOS: 1 day    Primary Psychiatric Diagnoses  Stimulant Use Disorder R/o Substance induced mood disorder  Assessment  Joel [name] is a 27 y.o. male admitted medically for 12/17/2022  5:28 PM for shortness of breath and fatigue in the setting of medication noncompliance, also found to be UDS + for amphetamines and THC. 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 He has a documented history of polysubstance use including cannabis and amphetamines, as well as a prior legal charge related to possession of crystal meth . Psychiatry was consulted for "substance use and ADHD" by Dr. Joneen Roach, MD on 12/18/2022.  Denies  Patient was not informed that psychiatry was consulted, and doesn't think that he needs to see psychiatry or get counselling. He declined full psychiatric consult/diagnostic interview.  Substance-induced mood disorder is on the differential but we are unable to formally assess.  He briefly reports a history of depression. Denies medication noncompliance has been motivated by thoughts of ending his life. Denies any suicidal thoughts, but does endorse access to firearms in his home.  Safety planning was discussed including securing firearms, identifying emergency contacts, and establishing a crisis response plan.  Denies auditory and visual hallucinations.  There is no evidence of paranoid ideations and no evidence of delusional thought processes.   Diagnoses:  Active Hospital problems: Principal Problem:   Acute systolic (congestive) heart failure (HCC) Active Problems:   Dilated cardiomyopathy (HCC)   Class 3 severe obesity due to excess calories with body mass index (BMI) of 40.0 to 44.9 in adult [facility])   Mixed hyperlipidemia   Primary hypertension   H/O: CVA (cerebrovascular  accident)     Plan   ## Psychiatric Medication Recommendations:  -- None at this time   ## Medical Decision Making Capacity:  Not formally assessed during this encounter  ## Further Work-up:  -- Per primary TOC consult for substance abuse resources   -- most recent EKG on 12/17/2022 had QtC of 502 -- Pertinent labwork reviewed earlier this admission includes:  BNP 510.8 UDS,+ amphetamines, + THC Lactic acid 6.5 >>1.2 Hepatitis B surface antigen - nonreactive HIV screen- nonreactive  ## Disposition:  -- There are no current psychiatric contraindications to discharge at this time  ## Behavioral / Environmental:  --   No specific recommendations at this time.    ## Safety and Observation Level:  - Based on my clinical evaluation, I estimate the patient to be at low risk of self harm in the current setting - At this time, we recommend a routine level of observation. This decision is based on my review of the chart including patient's history and current presentation, interview of the patient, mental status examination, and consideration of suicide risk including evaluating suicidal ideation, plan, intent, suicidal or self-harm behaviors, risk factors, and protective factors. This judgment is based on our ability to directly address suicide risk, implement suicide prevention strategies and develop a safety plan while the patient is in the clinical setting. Please contact our team if there is a concern that risk level has changed.  Suicide risk assessment  Patient has following modifiable risk factors for suicide: access to guns, recklessness, and medication noncompliance, which we are addressing by implementing a comprehensive safety plan and reinforcing importance of adherence to prescribed treatments.  Patient has following non-modifiable or demographic risk factors for suicide: male gender  Patient has the following protective factors against suicide: no history of suicide  attempts   Thank you for this consult request. Recommendations have been communicated to the primary team.  We will sign off at this time.   Lorri Frederick, MD  Psychiatric and Social History   Relevant Aspects of Hospital Course:  Admitted on 12/17/2022 for shortness of breath and fatigue in the setting of medication noncompliance, also found to be UDS + for amphetamines and THC.  Patient Report:  Patient assessed at bedside. He states that he used at a party 2 weeks ago, but doesn't use regularly (thought it was coke, but it was meth). He identifies as someone who used to have substance use problems - talks about being through counselling, rehab, having [facility] recommendations that he doesn't have substance use problems. He states he is here because he "stopped taking his meds" because his last echo was "good" (EF up to 45 from 20). He denies any financial stressors keeping him from accessing his meds. Denies SI, HI, AH/VH. Has access to firearms but keeps them locked away most of the time. He has had suicidal thoughts in the past (not recently), and is able to outline his personal safety plan should they come back (permanently lock up firearms, talk to friends, talk to grandma, go to [facility], etc). He is made aware that resources would be put in the chart.   Psychiatric ROS Mood Symptoms Unable to comprehensively assess due to limited participation in interview.  Admits to historical symptoms of depression, states " everyone gets like this".  There is no documented lifetime history of of suicide attempts.  Manic Symptoms Unable to comprehensively assess due to limited participation in interview.  There is no documented history of bipolar disorder on chart review.  Anxiety Symptoms Unable to comprehensively assess due to limited participation in interview.  There is a documented history of GAD with panic attacks, historically has responded well to hydroxyzine.  Has had poor response  to BuSpar. Trauma Symptoms Unable to comprehensively assess due to limited participation in interview.  No documented history of this. Psychosis Symptoms No documented history of delusions, hallucinations, disorganized thinking, or grossly disorganized or abnormal motor behavior.   Collateral information:  Patient did not provide permission to contact family members for collateral.  Psychiatric History:  Information collected from chart review  Prev Dx/Sx: Substance use disorder, GAD with panic attacks Current Psych Provider: Patient denies any outpatient follow-up with a psychiatric provider. Current Meds: No current psychiatric medications on chart review at this time. Previous Med Trials:  Lexapro Buspar Atarax Therapy: Unable to assess  Prior ECT: None identified on chart review Prior Psych Hospitalization: None identified on chart review Prior Self Harm: None identified on chart review Prior Violence: None identified on chart review  Family Psych History: Unable to assess Family Hx suicide: Unable to assess  Social History:  Developmental Hx: Unable to assess Educational Hx: Unable to assess Occupational Hx: Unable to assess Legal Hx: Has a prior legal charge for possession of crystal meth documented in 01/21/2021 (see Dr. Majel Homer, MD note) Living Situation: "Family very concerned as patient lives with the 47 year old parents. Psych consult and substance abuse evaluation requested " Spiritual Hx: None identified on chart review Access to weapons: Reports access to firearms in his home.  Declines to detail storage of firearms.    Tobacco use: Reports vape use to Dr.  Crosley on admission Alcohol use: Historically reports infrequent alcohol use. Drug use: Crystal meth use is documented since September 2022.  There is brief documentation of patient attending outpatient rehab for 36 days for drug use and alcohol use.   Exam Findings   Psychiatric Specialty  Exam:  Presentation  General Appearance: Casual  Eye Contact:Fair  Speech:Clear and Coherent; Normal Rate  Speech Volume:Normal  Handedness:Right   Mood and Affect  Mood:-- ("I would like you to leave")  Affect:Constricted   Thought Process  Thought Processes:Coherent; Goal Directed; Linear  Descriptions of Associations:Intact  Orientation:Full (Time, Place and Person)  Thought Content:Logical; WDL  History of Schizophrenia/Schizoaffective disorder:No data recorded Duration of Psychotic Symptoms:No data recorded Hallucinations:Hallucinations: None  Ideas of Reference:None  Suicidal Thoughts:Suicidal Thoughts: No  Homicidal Thoughts:Homicidal Thoughts: No   Sensorium  Memory:Immediate Fair  Judgment:Poor  Insight:Poor   Executive Functions  Concentration:Fair  Attention Span:Fair  Recall:Fair  Fund of Knowledge:Fair  Language:Fair   Psychomotor Activity  Psychomotor Activity:Psychomotor Activity: Normal   Assets  Assets:Communication Skills   Sleep  Sleep:Sleep: Fair    Physical Exam: Vital signs:  Temp:  [97.3 F (36.3 C)-99.3 F (37.4 C)] 98.4 F (36.9 C) (06/18 0751) Pulse Rate:  [84-129] 111 (06/18 1200) Resp:  [17-26] 22 (06/18 0751) BP: (142-190)/(96-144) 142/96 (06/18 1200) SpO2:  [89 %-100 %] 90 % (06/18 1200) Weight:  [133.4 kg-137.4 kg] 133.4 kg (06/17 2337) Physical Exam Constitutional:      General: He is not in acute distress.    Appearance: He is obese. He is not ill-appearing.  HENT:     Head: Normocephalic and atraumatic.  Pulmonary:     Effort: Pulmonary effort is normal. No respiratory distress.  Neurological:     General: No focal deficit present.     Blood pressure (!) 142/96, pulse (!) 111, temperature 98.4 F (36.9 C), temperature source Oral, resp. rate (!) 22, height 5\' 10"  (1.778 m), weight 133.4 kg, SpO2 90 %. Body mass index is 42.18 kg/m.   Other History   These have been pulled in  through the EMR, reviewed, and updated if appropriate.   Family History:  The patient's family history includes Diabetes in his mother and paternal grandfather.  Medical History: Past Medical History:  Diagnosis Date   Anxiety    Cerebral embolism with cerebral infarction 04/28/2020   Chronic systolic CHF 04/24/2020   Echo at [facility] 04/2020: EF < 20, mild to mod MR, LV thrombus   Depression    Dilated cardiomyopathy (HCC)    History of COVID-19    Hypertension    LV (left ventricular) mural thrombus     Surgical History: Past Surgical History:  Procedure Laterality Date   TONSILLECTOMY      Medications:   Current Facility-Administered Medications:    0.9 %  sodium chloride infusion, 250 mL, Intravenous, PRN, Crosley, Debby, MD   acetaminophen (TYLENOL) tablet 650 mg, 650 mg, Oral, Q4H PRN, Crosley, Debby, MD, 650 mg at 12/18/22 1155   albuterol (PROVENTIL) (2.5 MG/3ML) 0.083% nebulizer solution 2.5 mg, 2.5 mg, Nebulization, Q2H PRN, Crosley, Debby, MD   carvedilol (COREG) tablet 6.25 mg, 6.25 mg, Oral, BID WC, Gonfa, Taye T, MD, 6.25 mg at 12/18/22 0817   cefTRIAXone (ROCEPHIN) 2 g in sodium chloride 0.9 % 100 mL IVPB, 2 g, Intravenous, Q24H, Gonfa, Taye T, MD, Last Rate: 200 mL/hr at 12/18/22 1110, 2 g at 12/18/22 1110   folic acid (FOLVITE) tablet 1 mg, 1 mg, Oral,  Daily, Gery Pray, MD, 1 mg at 12/18/22 0117   furosemide (LASIX) injection 20 mg, 20 mg, Intravenous, BID, Crosley, Debby, MD, 20 mg at 12/18/22 0805   hydrALAZINE (APRESOLINE) tablet 25 mg, 25 mg, Oral, Q8H, Crosley, Debby, MD, 25 mg at 12/18/22 0805   LORazepam (ATIVAN) tablet 1-4 mg, 1-4 mg, Oral, Q1H PRN, 2 mg at 12/18/22 1156 **OR** LORazepam (ATIVAN) injection 1-4 mg, 1-4 mg, Intravenous, Q1H PRN, Crosley, Debby, MD   metoprolol tartrate (LOPRESSOR) injection 5 mg, 5 mg, Intravenous, Q6H PRN, Hall, Carole N, DO, 5 mg at 12/18/22 0541   multivitamin with minerals tablet 1 tablet, 1 tablet, Oral,  Daily, Crosley, Debby, MD, 1 tablet at 12/18/22 0117   ondansetron (ZOFRAN) injection 4 mg, 4 mg, Intravenous, Q6H PRN, Crosley, Debby, MD   Oral care mouth rinse, 15 mL, Mouth Rinse, PRN, Crosley, Debby, MD   perflutren lipid microspheres (DEFINITY) IV suspension, 1-10 mL, Intravenous, PRN, Crosley, Debby, MD, 2 mL at 12/18/22 1226   sodium chloride flush (NS) 0.9 % injection 3 mL, 3 mL, Intravenous, Q12H, Crosley, Debby, MD, 3 mL at 12/18/22 1110   sodium chloride flush (NS) 0.9 % injection 3 mL, 3 mL, Intravenous, PRN, Crosley, Debby, MD   thiamine (VITAMIN B1) tablet 100 mg, 100 mg, Oral, Daily, 100 mg at 12/18/22 0117 **OR** thiamine (VITAMIN B1) injection 100 mg, 100 mg, Intravenous, Daily, Crosley, Debby, MD  Allergies: No Known Allergies

## 2022-12-18 NOTE — Consult Note (Signed)
Advanced Heart Failure Team Consult Note   Primary Physician: Blane Ohara, MD PCP-Cardiologist: Dr. [name]   Reason for Consultation: acute on chronic systolic heart failure   HPI:    Joel [name] is seen today for evaluation of acute on chronic systolic heart failure at the request of Dr. Alanda Slim, Internal Medicine.   Joel [name] is 27 y.o. male with a hx of hypertension, anxiety and prior COVID-19 infection in June  2021 who presented to [facility] in 10/21 with shortness of breath and hemoptysis x 1 month. An echocardiogram demonstrated EF < 20% and an LV thrombus.  A CT scan demonstrated multifocal pneumonia, no PE.  He was started on furosemide and Lovenox and transferred to [facility] for further evaluation and management.  He was started on milrinone with low output by co-ox and diuresed. Cardiac MRI showed severe LV dilation with diffuse hypokinesis, EF 15%, small LV apical thrombus, moderate RV dilation with EF 19%, LGE pattern concerning for myocarditis with diffuse patchy mid-wall LGE.  He was treated with ceftriaxone and doxycycline for PNA. He had a CVA with left-sided weakness and left facial numbness.  MRI showed multifocal bilateral infarcts consistent with cardio-embolism.  He was transitioned from Lovenox/warfarin to IV heparin gtt/warfarin.  Left-sided weakness resolved. He was titrated onto cardiac meds and eventually discharged home.    Echo in 3/22 showed EF 35% with global hypokinesis, mild LV dilation, mildly decreased RV systolic function. No LV thrombus.      Echo 9/22 showed EF 45% with mild LVH, diffuse hypokinesis, normal RV, no thrombus.  Unfortunately was lost to f/u in the [facility]. Not seen since 03/2022. Since that time, has been off cardiac meds w/ continued polysubstance use, including use of cannabis and amphetamines.   He presented to the ED yesterday w/ worsening dyspnea, had progressed over several weeks. Found to be in a/c CHF w/  Lactic acidosis, initial LA 6.5. SCr 1.28, CO2 16. K 4.3. WBC 10.7K. Afebrile. BNP 510. Hs trop 67>>89. EKG ST 127 bpm. Initial BP 180/121. UDS positive for amphetamines and marijuana. CXR + for mild peribronchial thickening. No frank edema. Chest CT negative for both PE and dissection. +extensive bilateral ground-glass opacities, right greater than left, with trace left pleural effusion. He was admitted and started on IV Lasix. Repeat Lactic acid returned normal at 1.2. HIV NR.   He had 3.4L in [facility]OP yesterday w/ IV Lasix, Scr slightly up from admit, 1.30>>1.46. CO2 19.   2D Echo done today and shows severely reduced LVEF ~15%, LV severely dilated, global HK, mild LVH, RV normal. + severe LAE, mild MR, + small pericardial effusion. AHF team consulted for further management. Definity contrast use, no visible LV thrombus seen.   Of note, psych also following, helping w/ stimulant use disorder.   On exam, he is resting comfortably in bed. No respiratory distress. Remains volume overloaded and tachycardic. Also hypertensive w/ SBPs in the 180s. Grandparents present at bedside.     Echo 12/18/22 1. There is no left ventricular thrombus (Definity contrast was used).  Left ventricular ejection fraction, by estimation, is 15%. The left  ventricle has severely decreased function. The left ventricle demonstrates  global hypokinesis. The left  ventricular internal cavity size was severely dilated. There is mild  eccentric left ventricular hypertrophy. Indeterminate diastolic filling  due to E-A fusion.   2. Right ventricular systolic function is normal. The right ventricular  size is normal. Tricuspid regurgitation signal is inadequate  for assessing  PA pressure.   3. Left atrial size was severely dilated.   4. A small pericardial effusion is present.   5. The mitral valve is normal in structure. Mild mitral valve  regurgitation.   6. The aortic valve is tricuspid. Aortic valve regurgitation is not   visualized. No aortic stenosis is present.   Review of Systems: [y] = yes, [ ]  = no   General: Weight gain [ ] ; Weight loss [ ] ; Anorexia [ ] ; Fatigue [Y ]; Fever [ ] ; Chills [ ] ; Weakness [ Y]  Cardiac: Chest pain/pressure [ ] ; Resting SOB [Y ]; Exertional SOB [ Y]; Orthopnea [ Y]; Pedal Edema [ ] ; Palpitations [ ] ; Syncope [ ] ; Presyncope [ ] ; Paroxysmal nocturnal dyspnea[ ]   Pulmonary: Cough [ ] ; Wheezing[ ] ; Hemoptysis[ ] ; Sputum [ ] ; Snoring [ ]   GI: Vomiting[ ] ; Dysphagia[ ] ; Melena[ ] ; Hematochezia [ ] ; Heartburn[ ] ; Abdominal pain [ ] ; Constipation [ ] ; Diarrhea [ ] ; BRBPR [ ]   GU: Hematuria[ ] ; Dysuria [ ] ; Nocturia[ ]   Vascular: Pain in legs with walking [ ] ; Pain in feet with lying flat [ ] ; Non-healing sores [ ] ; Stroke [ ] ; TIA [ ] ; Slurred speech [ ] ;  Neuro: Headaches[ ] ; Vertigo[ ] ; Seizures[ ] ; Paresthesias[ ] ;Blurred vision [ ] ; Diplopia [ ] ; Vision changes [ ]   Ortho/Skin: Arthritis [ ] ; Joint pain [ ] ; Muscle pain [ ] ; Joint swelling [ ] ; Back Pain [ ] ; Rash [ ]   Psych: Depression[ ] ; Anxiety[ ]   Heme: Bleeding problems [ ] ; Clotting disorders [ ] ; Anemia [ ]   Endocrine: Diabetes [ ] ; Thyroid dysfunction[ ]   Home Medications Prior to Admission medications   Medication Sig Start Date End Date Taking? Authorizing Provider  albuterol (PROVENTIL) (2.5 MG/3ML) 0.083% nebulizer solution Take 2.5 mg by nebulization every 6 (six) hours as needed for wheezing or shortness of breath.   Yes [provider]  hydrOXYzine (ATARAX) 50 MG tablet Take 100 mg by mouth daily as needed for anxiety or itching.   Yes [provider]    Past Medical History: Past Medical History:  Diagnosis Date   Anxiety    Cerebral embolism with cerebral infarction 04/28/2020   Chronic systolic CHF 04/24/2020   Echo at [facility] 04/2020: EF < 20, mild to mod MR, LV thrombus   Depression    Dilated cardiomyopathy (HCC)    History of COVID-19    Hypertension    LV (left  ventricular) mural thrombus     Past Surgical History: Past Surgical History:  Procedure Laterality Date   TONSILLECTOMY      Family History: Family History  Problem Relation Age of Onset   Diabetes Mother    Diabetes Paternal Grandfather    Heart failure Neg Hx     Social History: Social History   Socioeconomic History   Marital status: Single    Spouse name: Not on file   Number of children: Not on file   Years of education: Not on file   Highest education level: Not on file  Occupational History   Occupation: Full time/Plumbing  Tobacco Use   Smoking status: Former    Types: E-cigarettes   Smokeless tobacco: Current    Types: Chew   Tobacco comments:    quit around June 2021  Vaping Use   Vaping Use: Former   Substances: Nicotine  Substance and Sexual Activity   Alcohol use: Yes    Alcohol/week: 7.0 standard drinks of  alcohol    Types: 7 Cans of beer per week    Comment: Patient used to drink occasionally a beer, but stopped when started lexapro   Drug use: Yes    Types: Marijuana   Sexual activity: Yes    Birth control/protection: Condom  Other Topics Concern   Not on file  Social History Narrative   Lives with grandmother, grandfather and sister   Right Handed   Drinks rarely caffeine   Social Determinants of Health   Financial Resource Strain: Not on file  Food Insecurity: No Food Insecurity (12/17/2022)   Hunger Vital Sign    Worried About Running Out of Food in the Last Year: Never true    Ran Out of Food in the Last Year: Never true  Transportation Needs: No Transportation Needs (12/17/2022)   PRAPARE - Administrator, Civil Service (Medical): No    Lack of Transportation (Non-Medical): No  Physical Activity: Not on file  Stress: Not on file  Social Connections: Not on file    Allergies:  No Known Allergies  Objective:    Vital Signs:   Temp:  [98.1 F (36.7 C)-99.3 F (37.4 C)] 98.4 F (36.9 C) (06/18 0751) Pulse Rate:   [91-129] 111 (06/18 1200) Resp:  [17-26] 22 (06/18 0751) BP: (142-190)/(96-144) 142/96 (06/18 1200) SpO2:  [89 %-100 %] 90 % (06/18 1200) Weight:  [133.4 kg-137.4 kg] 133.4 kg (06/17 2337) Last BM Date : 12/18/22  Weight change: Filed Weights   12/17/22 1741 12/17/22 2337  Weight: (!) 137.4 kg 133.4 kg    Intake/Output:   Intake/Output Summary (Last 24 hours) at 12/18/2022 1542 Last data filed at 12/18/2022 1503 Gross per 24 hour  Intake 840 ml  Output 3600 ml  Net -2760 ml      Physical Exam    General: young WM, fatigued appearing. No resp difficulty HEENT: normal Neck: supple. JVP elevated . Carotids 2+ bilat; no bruits. No lymphadenopathy or thyromegaly appreciated. Cor: PMI nondisplaced. Regular rhythm and tachy rate. No rubs, gallops or murmurs. Lungs: decreased BS at the bases bilaterally  Abdomen: soft, nontender, nondistended. No hepatosplenomegaly. No bruits or masses. Good bowel sounds. Extremities: no cyanosis, clubbing, rash, 1+ b/l LE edema Neuro: alert & orientedx3, cranial nerves grossly intact. moves all 4 extremities w/o difficulty. Affect pleasant   Telemetry   Sinus tach 120s  EKG    Sinus tach 125 bpm   Labs   Basic Metabolic Panel: Recent Labs  Lab 12/17/22 1839 12/17/22 1847 12/17/22 2341 12/18/22 0251 12/18/22 0835  NA 138 136 135  --  137  K 4.3 4.0 3.8  --  4.0  CL 106 109 102  --  104  CO2 16*  --  22  --  19*  GLUCOSE 80 79 109*  --  124*  BUN 12 13 12   --  13  CREATININE 1.28* 1.30* 1.36*  --  1.46*  CALCIUM 9.0  --  8.9  --  9.1  MG  --   --  2.0 2.2  --   PHOS  --   --   --  4.5 4.9*    Liver Function Tests: Recent Labs  Lab 12/17/22 2341 12/18/22 0835  AST 41  --   ALT 67*  --   ALKPHOS 51  --   BILITOT 1.4*  --   PROT 6.7  --   ALBUMIN 3.7 3.5   No results for input(s): "LIPASE", "AMYLASE" in the last 168  hours. No results for input(s): "AMMONIA" in the last 168 hours.  CBC: Recent Labs  Lab  12/17/22 1839 12/17/22 1847 12/18/22 0251  WBC 10.7*  --  12.1*  NEUTROABS 7.8*  --  8.7*  HGB 16.8 16.7 16.7  HCT 50.0 49.0 46.7  MCV 96.7  --  91.2  PLT 291  --  244    Cardiac Enzymes: No results for input(s): "CKTOTAL", "CKMB", "CKMBINDEX", "TROPONINI" in the last 168 hours.  BNP: BNP (last 3 results) Recent Labs    03/07/22 1120 12/17/22 1839  BNP 13.9 510.8*    ProBNP (last 3 results) No results for input(s): "PROBNP" in the last 8760 hours.   CBG: Recent Labs  Lab 12/17/22 1812  GLUCAP 94    Coagulation Studies: Recent Labs    12/17/22 01-17-32  LABPROT 13.9  INR 1.1     Imaging   ECHOCARDIOGRAM COMPLETE  Result Date: 12/18/2022    ECHOCARDIOGRAM REPORT   Patient Name:   LEWELL BERDECIA [facility] Date of Exam: 12/18/2022 Medical Rec #:  [id number]           Height:       70.0 in Accession #:    [id number]          Weight:       294.0 lb Date of Birth:  07-18-1995           BSA:          2.459 m Patient Age:    26 years            BP:           164/122 mmHg Patient Gender: M                   HR:           108 bpm. Exam Location:  Inpatient Procedure: 2D Echo, Cardiac Doppler, Color Doppler and Intracardiac            Opacification Agent Indications:    CHF  History:        Patient has prior history of Echocardiogram examinations, most                 recent 03/07/2022. Cardiomyopathy and CHF; Risk                 Factors:Hypertension.  Sonographer:    Darlys Gales Referring Phys: [phone] DEBBY CROSLEY IMPRESSIONS  1. There is no left ventricular thrombus (Definity contrast was used). Left ventricular ejection fraction, by estimation, is 15%. The left ventricle has severely decreased function. The left ventricle demonstrates global hypokinesis. The left ventricular internal cavity size was severely dilated. There is mild eccentric left ventricular hypertrophy. Indeterminate diastolic filling due to E-A fusion.  2. Right ventricular systolic function is normal. The right  ventricular size is normal. Tricuspid regurgitation signal is inadequate for assessing PA pressure.  3. Left atrial size was severely dilated.  4. A small pericardial effusion is present.  5. The mitral valve is normal in structure. Mild mitral valve regurgitation.  6. The aortic valve is tricuspid. Aortic valve regurgitation is not visualized. No aortic stenosis is present. Comparison(s): Prior images reviewed side by side. The left ventricular function is significantly worse. The left ventricle is now severely dilated. FINDINGS  Left Ventricle: There is no left ventricular thrombus (Definity contrast was used). Left ventricular ejection fraction, by estimation, is 15%. The left ventricle has severely decreased function. The left ventricle demonstrates global hypokinesis.  Definity contrast agent was given IV to delineate the left ventricular endocardial borders. The left ventricular internal cavity size was severely dilated. There is mild eccentric left ventricular hypertrophy. Indeterminate diastolic filling due to E-A fusion. Right Ventricle: The right ventricular size is normal. No increase in right ventricular wall thickness. Right ventricular systolic function is normal. Tricuspid regurgitation signal is inadequate for assessing PA pressure. Left Atrium: Left atrial size was severely dilated. Right Atrium: Right atrial size was normal in size. Pericardium: A small pericardial effusion is present. Mitral Valve: The mitral valve is normal in structure. Mild mitral valve regurgitation, with centrally-directed jet. Tricuspid Valve: The tricuspid valve is normal in structure. Tricuspid valve regurgitation is not demonstrated. Aortic Valve: The aortic valve is tricuspid. Aortic valve regurgitation is not visualized. No aortic stenosis is present. Aortic valve mean gradient measures 2.0 mmHg. Aortic valve peak gradient measures 3.1 mmHg. Aortic valve area, by VTI measures 3.55 cm. Pulmonic Valve: The pulmonic valve  was not well visualized. Pulmonic valve regurgitation is not visualized. No evidence of pulmonic stenosis. Aorta: The aortic root and ascending aorta are structurally normal, with no evidence of dilitation. Venous: The inferior vena cava was not well visualized. IAS/Shunts: No atrial level shunt detected by color flow Doppler.  LEFT VENTRICLE PLAX 2D LVIDd:         7.40 cm   Diastology LVIDs:         6.20 cm   LV e' medial:    7.29 cm/s LV PW:         1.30 cm   LV E/e' medial:  13.9 LV IVS:        1.30 cm   LV e' lateral:   9.90 cm/s LVOT diam:     2.50 cm   LV E/e' lateral: 10.2 LV SV:         42 LV SV Index:   17 LVOT Area:     4.91 cm  LEFT ATRIUM              Index        RIGHT ATRIUM           Index LA Vol (A2C):   53.6 ml  21.80 ml/m  RA Area:     11.10 cm LA Vol (A4C):   105.0 ml 42.71 ml/m  RA Volume:   23.70 ml  9.64 ml/m LA Biplane Vol: 77.0 ml  31.32 ml/m  AORTIC VALVE AV Area (Vmax):    3.66 cm AV Area (Vmean):   3.30 cm AV Area (VTI):     3.55 cm AV Vmax:           87.50 cm/s AV Vmean:          66.700 cm/s AV VTI:            0.119 m AV Peak Grad:      3.1 mmHg AV Mean Grad:      2.0 mmHg LVOT Vmax:         65.30 cm/s LVOT Vmean:        44.800 cm/s LVOT VTI:          0.086 m LVOT/AV VTI ratio: 0.72  AORTA Ao Root diam: 3.80 cm Ao Asc diam:  3.70 cm MITRAL VALVE MV Area (PHT): 6.88 cm     SHUNTS MV E velocity: 101.00 cm/s  Systemic VTI:  0.09 m  Systemic Diam: 2.50 cm Thurmon Fair MD Electronically signed by Thurmon Fair MD Signature Date/Time: 12/18/2022/1:44:46 PM    Final    CT Angio Chest PE W and/or Wo Contrast  Result Date: 12/17/2022 CLINICAL DATA:  Tachycardia, hemoptysis, tachypnea, pleuritic chest pain EXAM: CT ANGIOGRAPHY CHEST WITH CONTRAST TECHNIQUE: Multidetector CT imaging of the chest was performed using the standard protocol during bolus administration of intravenous contrast. Multiplanar CT image reconstructions and MIPs were obtained to  evaluate the vascular anatomy. RADIATION DOSE REDUCTION: This exam was performed according to the departmental dose-optimization program which includes automated exposure control, adjustment of the mA and/or kV according to patient size and/or use of iterative reconstruction technique. CONTRAST:  75mL OMNIPAQUE IOHEXOL 350 MG/ML SOLN COMPARISON:  04/23/2020 FINDINGS: Cardiovascular: SVC patent. Mild cardiomegaly predominantly left-sided. No pericardial effusion. The RV is nondilated. Satisfactory opacification of pulmonary arteries noted, and there is no evidence of pulmonary emboli. Incomplete opacification of the thoracic aorta, without aneurysm. Mediastinum/Nodes: No mass. 1.1 cm AP window lymph node stable since previous. No hilar adenopathy. Lungs/Pleura: Trace left pleural effusion. No pneumothorax. Extensive ground-glass opacities throughout both lower lobes and posterior upper lobes right greater than left, new since previous. Upper Abdomen: No acute findings. Musculoskeletal: No chest wall abnormality. No acute or significant osseous findings. Review of the MIP images confirms the above findings. IMPRESSION: 1. Negative for acute PE or thoracic aortic dissection. 2. Extensive bilateral ground-glass opacities, right greater than left, with trace left pleural effusion. Considerations include asymmetric edema versus infectious/inflammatory process. 3. Mild cardiomegaly. Electronically Signed   By: Corlis Leak M.D.   On: 12/17/2022 20:22   DG Chest 2 View  Result Date: 12/17/2022 CLINICAL DATA:  Shortness of breath for a month EXAM: CHEST - 2 VIEW COMPARISON:  X-ray 04/24/2020 FINDINGS: Underinflation with bronchovascular crowding. Mild peribronchial thickening. No consolidation, pneumothorax, effusion or edema. Overlapping cardiac leads. Normal cardiopericardial silhouette. IMPRESSION: Underinflation.  Mild peribronchial thickening. Electronically Signed   By: Karen Kays M.D.   On: 12/17/2022 18:22      Medications:     Current Medications:  carvedilol  6.25 mg Oral BID WC   folic acid  1 mg Oral Daily   furosemide  20 mg Intravenous BID   hydrALAZINE  25 mg Oral Q8H   multivitamin with minerals  1 tablet Oral Daily   nicotine  21 mg Transdermal Daily   sodium chloride flush  3 mL Intravenous Q12H   thiamine  100 mg Oral Daily   Or   thiamine  100 mg Intravenous Daily    Infusions:  sodium chloride     cefTRIAXone (ROCEPHIN)  IV Stopped (12/18/22 1142)      Patient Profile   27 y/o male w/ chronic systolic heart failure due to NICM, suspected viral myocarditis +/- drug induced, h/o LV thrombus, prior cardio embolic CVA, HTN and polysubstance abuse, admitted w/ acute on chronic systolic heart failure in setting of med noncompliance and ongoing substance use.   Assessment/Plan   1. Acute on Chronic Systolic Heart Failure - NICM. Diagnosed post COVID infection in 2021 - Echo 10/21 EF < 20% + LV thrombus. CMRI with LVEF 15%, RVEF 19%, LGE pattern concerning for myocarditis. Suspect most likely etiology for cardiomyopathy is viral myocarditis due to COVID-19 though methamphetamine use may have contributed as well.  - Echo in 3/22 showed improved EF to 35%.  - Echo 9/22 showed EF up to 45%.  - lost to f/u. Stopped all GDMT + ongoing  substance use. Now admitted w/ a/c CHF. Admission labs concerning for low output. LA 6.5>>1.2, CO2 16>>19 today. Echo w/ severely reduced LVEF 15%, RV normal.  - he is diuresing w/ IV Lasix. Remains fluid overloaded, tachycardic and hypertensive.  - Increase IV Lasix to 40 mg bid.  - Add digoxin 0.125 mg daily  - Increase hydralazine to 50 tid for afterload reduction - he is on Coreg 6.25 mg bid. Continue at current dose, avoid further up titration until better diuresed  - will need R/LHC after diuresis - reiterated importance of discontinuation of substance use (UDS + for amphetamines) - not a candidate for advanced therapies given  noncompliance and substance use   2. Hypertension  - BP elevated 180s systolic - GDMT per above - increase hydralazine to 50 tid   3. AKI - b/l SCr 0.8-1.0  - 1.3 on admit>>1.46 today w/ diuresis  - suspect cardiorenal  - diuresis + GDMT per above  - follow BMP  4. Polysubstance Use/ Stimulant Use Disorder - UDS + for amphetamines and cannabis - psych following  - imperative to quit  - TOC consulted for substance abuse resources   5. H/o LV Thrombus w/ Cardioembolic CVA - occurred in 2021 - previously on Coumadin, but had self discontinued all meds - Echo this admit w/ severely dilated LV, severely reduced systolic fx. No LV thrombus w/ definity contrast  - given persistent, severely reduced LVEF would recommend restarting anticoagulation for secondary prevention   Length of Stay: 1  Tanor Glaspy, PA-C  12/18/2022, 3:42 PM  Advanced Heart Failure Team Pager [phone] (M-F; 7a - 5p)  Please contact [facility] for night-coverage after hours (4p -7a ) and weekends on [email or web address]

## 2022-12-18 NOTE — Progress Notes (Signed)
CSW received consult for patient. CSW spoke with patients at bedside. Patient reports PTA he comes from home with Grandparents. CSW offered patient outpatient substance use treatment services resources. Patient accepted. All questions answered. No further questions reported at this time. Patient reports his Grandparents will provide transportation for him at dc.

## 2022-12-18 NOTE — Progress Notes (Signed)
Heart Failure Navigator Progress Note  Assessed for Heart & Vascular TOC clinic readiness.  Patient does not meet criteria due to Advanced Heart Failure Team patient of Dr. McLean.   Navigator will sign off at this time.   Ludy Messamore, BSN, RN Heart Failure Nurse Navigator Secure Chat Only   

## 2022-12-18 NOTE — Progress Notes (Signed)
PROGRESS NOTE  [name] AVW:[id number] DOB: 10/07/1995   PCP: Blane Ohara, MD  Patient is from: Home  DOA: 12/17/2022 LOS: 1  Chief complaints Chief Complaint  Patient presents with   Hypertension   Tachycardia   Shortness of Breath     Brief Narrative / Interim history: 27 year old M with PMH of COVID-related DCM, LV thrombus, CVA, polysubstance use including meth, cocaine, marijuana, alcohol and tobacco and noncompliance presenting with progressive shortness of breath for 1 months and orthopnea for about a week, and admitted for acute on chronic systolic CHF and AKI.  BNP elevated to 510.  CXR with mild cardiomegaly, asymmetric edema versus infection.  Troponin elevated 67>> 89.  Lactic acid 6.5 but quickly resolved.  To he was started on IV Lasix and IV ceftriaxone.  Cardiology consulted and TTE ordered.  The next day, TTE with LVEF of 15%, GH, indeterminate DD, severe LAE and normal RVSF.  Symptoms improved with IV Lasix.  Cardiology to see patient.  Subjective: Seen and examined earlier this morning.  No major events overnight of this morning.  He was sleepy but woke to voice easily.  Reports improvement in his breathing.  Denies chest pain, dyspnea, orthopnea or edema at the moment.  Reports some intermittent cough with off-white phlegm.  Reports using meds.  He says he has not used cocaine in 2 weeks.  Has history of ADHD.  Denies depressed mood, suicidal or homicidal ideation.  Objective: Vitals:   12/18/22 0418 12/18/22 0541 12/18/22 0751 12/18/22 1200  BP: (!) 161/122 (!) 176/119 (!) 164/122 (!) 142/96  Pulse: (!) 115 (!) 115  (!) 111  Resp: (!) 22  (!) 22   Temp: 98.4 F (36.9 C)  98.4 F (36.9 C)   TempSrc: Oral  Oral   SpO2: 97%   90%  Weight:      Height:        Examination:  GENERAL: No apparent distress.  Nontoxic. HEENT: MMM.  Vision and hearing grossly intact.  NECK: Supple.  No apparent JVD.  RESP:  No IWOB.  Fair aeration bilaterally. CVS:   RRR. Heart sounds normal.  ABD/GI/GU: BS+. Abd soft, NTND.  MSK/EXT:  Moves extremities. No apparent deformity. No edema.  SKIN: no apparent skin lesion or wound NEURO: Awake, alert and oriented appropriately.  No apparent focal neuro deficit. PSYCH: Calm. Normal affect.   Procedures:  None  Microbiology summarized: Blood cultures NGTD  Assessment and plan: Principal Problem:   Acute systolic (congestive) heart failure (HCC) Active Problems:   Dilated cardiomyopathy (HCC)   Class 3 severe obesity due to excess calories with body mass index (BMI) of 40.0 to 44.9 in adult [facility])   Mixed hyperlipidemia   Primary hypertension   H/O: CVA (cerebrovascular accident)  Acute on chronic systolic CHF/history of DCM: TTE with LVEF of 15%, GH, severe LAE and normal RVSP.  Presented with progressive dyspnea and orthopnea in the setting of noncompliance with meds and ongoing use of polysubstance.  Symptoms improved with IV Lasix.  Creatinine slightly up.  Hemodynamically stable. -Continue IV Lasix -Cardiology started digoxin and hydralazine. -Started Coreg for his tachycardia -Strict intake and output, daily weight, renal functions and electrolytes -Counseled on the importance of substance use cessation.  History of LV thrombus: Does not seem to be on anticoagulation.  No report of thrombus on current TTE. -Defer to cardiology.  Uncontrolled hypertension: Likely due to noncompliance and polysubstance use. -Started Coreg and hydralazine as above -Continue diuretics -Further adjustment  as appropriate   AKI: Not on nephrotoxic meds at home.  Could be due to polysubstance use and CHF. Recent Labs    03/07/22 1130 12/17/22 1839 12/17/22 1847 12/17/22 2341 12/18/22 0835  BUN 12 12 13 12 13   CREATININE 1.02 1.28* 1.30* 1.36* 1.46*  -Continue monitoring  Alcohol use disorder: No withdrawal symptoms.  Reports drinking 12 to 15 g/coke on the weekends. -Continue CIWA with as needed  Ativan -Continue thiamine, folic acid and multivitamin  Polysubstance use: UDS positive for amphetamine and marijuana.  Also admits to using cocaine, alcohol and tobacco. -Encourage cessation -Nicotine patch  Elevated troponin: Likely due to CHF and AKI.  Doubt ACS.  TTE as above. -Defer to cardiology   Lactic acidosis: Resolved quickly.  Could be related to alcohol use  Prolonged QTC -Optimize electrolytes -Avoid or minimize QT prolonging drugs  ADHD: Does not seem to be on meds at home. -Outpatient follow-up  History of CVA due to LV thrombus: Stable.  No focal neurodeficit.  Possible pneumonia: CT angio showed extensive bilateral groundglass opacities.  Although this could be due to CHF, you also have mild leukocytosis with some productive cough.  -Complete ceftriaxone for 5 days  Morbid obesity Body mass index is 42.18 kg/m.          DVT prophylaxis:  enoxaparin (LOVENOX) injection 40 mg Start: 12/18/22 1730  Code Status: Full code Family Communication: None at bedside Level of care: Telemetry Cardiac Status is: Inpatient Remains inpatient appropriate because: Acute CHF, AKI   Final disposition: Home Consultants:  Cardiology Psychiatry (signed off)  55 minutes with more than 50% spent in reviewing records, counseling patient/family and coordinating care.   Sch Meds:  Scheduled Meds:  carvedilol  6.25 mg Oral BID WC   digoxin  0.125 mg Oral Daily   enoxaparin (LOVENOX) injection  40 mg Subcutaneous Q24H   folic acid  1 mg Oral Daily   furosemide  40 mg Intravenous BID   hydrALAZINE  50 mg Oral Q8H   multivitamin with minerals  1 tablet Oral Daily   nicotine  21 mg Transdermal Daily   sodium chloride flush  3 mL Intravenous Q12H   thiamine  100 mg Oral Daily   Or   thiamine  100 mg Intravenous Daily   Continuous Infusions:  sodium chloride     cefTRIAXone (ROCEPHIN)  IV Stopped (12/18/22 1142)   PRN Meds:.sodium chloride, acetaminophen,  albuterol, LORazepam **OR** LORazepam, metoprolol tartrate, ondansetron (ZOFRAN) IV, mouth rinse, sodium chloride flush  Antimicrobials: Anti-infectives (From admission, onward)    Start     Dose/Rate Route Frequency Ordered Stop   12/18/22 0900  cefTRIAXone (ROCEPHIN) 2 g in sodium chloride 0.9 % 100 mL IVPB        2 g 200 mL/hr over 30 Minutes Intravenous Every 24 hours 12/18/22 0807 12/22/22 0859   12/17/22 1815  cefTRIAXone (ROCEPHIN) 2 g in sodium chloride 0.9 % 100 mL IVPB        2 g 200 mL/hr over 30 Minutes Intravenous  Once 12/17/22 1809 12/17/22 2126        I have personally reviewed the following labs and images: CBC: Recent Labs  Lab 12/17/22 1839 12/17/22 1847 12/18/22 0251  WBC 10.7*  --  12.1*  NEUTROABS 7.8*  --  8.7*  HGB 16.8 16.7 16.7  HCT 50.0 49.0 46.7  MCV 96.7  --  91.2  PLT 291  --  244   BMP &GFR Recent Labs  Lab  12/17/22 1839 12/17/22 1847 12/17/22 2341 12/18/22 0251 12/18/22 0835  NA 138 136 135  --  137  K 4.3 4.0 3.8  --  4.0  CL 106 109 102  --  104  CO2 16*  --  22  --  19*  GLUCOSE 80 79 109*  --  124*  BUN 12 13 12   --  13  CREATININE 1.28* 1.30* 1.36*  --  1.46*  CALCIUM 9.0  --  8.9  --  9.1  MG  --   --  2.0 2.2  --   PHOS  --   --   --  4.5 4.9*   Estimated Creatinine Clearance: 107.3 mL/min (A) (by C-G formula based on SCr of 1.46 mg/dL (H)). Liver & Pancreas: Recent Labs  Lab 12/17/22 2341 12/18/22 0835  AST 41  --   ALT 67*  --   ALKPHOS 51  --   BILITOT 1.4*  --   PROT 6.7  --   ALBUMIN 3.7 3.5   No results for input(s): "LIPASE", "AMYLASE" in the last 168 hours. No results for input(s): "AMMONIA" in the last 168 hours. Diabetic: No results for input(s): "HGBA1C" in the last 72 hours. Recent Labs  Lab 12/17/22 1812  GLUCAP 94   Cardiac Enzymes: No results for input(s): "CKTOTAL", "CKMB", "CKMBINDEX", "TROPONINI" in the last 168 hours. No results for input(s): "PROBNP" in the last 8760  hours. Coagulation Profile: Recent Labs  Lab 12/17/22 2033  INR 1.1   Thyroid Function Tests: No results for input(s): "TSH", "T4TOTAL", "FREET4", "T3FREE", "THYROIDAB" in the last 72 hours. Lipid Profile: No results for input(s): "CHOL", "HDL", "LDLCALC", "TRIG", "CHOLHDL", "LDLDIRECT" in the last 72 hours. Anemia Panel: No results for input(s): "VITAMINB12", "FOLATE", "FERRITIN", "TIBC", "IRON", "RETICCTPCT" in the last 72 hours. Urine analysis:    Component Value Date/Time   COLORURINE YELLOW 12/17/2022 1914   APPEARANCEUR CLEAR 12/17/2022 1914   LABSPEC 1.010 12/17/2022 1914   PHURINE 7.0 12/17/2022 1914   GLUCOSEU NEGATIVE 12/17/2022 1914   HGBUR NEGATIVE 12/17/2022 1914   BILIRUBINUR NEGATIVE 12/17/2022 1914   KETONESUR NEGATIVE 12/17/2022 1914   PROTEINUR NEGATIVE 12/17/2022 1914   NITRITE NEGATIVE 12/17/2022 1914   LEUKOCYTESUR NEGATIVE 12/17/2022 1914   Sepsis Labs: Invalid input(s): "PROCALCITONIN", "LACTICIDVEN"  Microbiology: Recent Results (from the past 240 hour(s))  Blood Culture (routine x 2)     Status: None (Preliminary result)   Collection Time: 12/17/22  6:39 PM   Specimen: BLOOD RIGHT ARM  Result Value Ref Range Status   Specimen Description BLOOD RIGHT ARM  Final   Special Requests   Final    BOTTLES DRAWN AEROBIC AND ANAEROBIC Blood Culture results may not be optimal due to an inadequate volume of blood received in culture bottles   Culture   Final    NO GROWTH < 12 HOURS Performed at [facility], [address], [place], [place] [address]    Report Status PENDING  Incomplete  Blood Culture (routine x 2)     Status: None (Preliminary result)   Collection Time: 12/17/22  7:14 PM   Specimen: BLOOD RIGHT HAND  Result Value Ref Range Status   Specimen Description BLOOD RIGHT HAND  Final   Special Requests   Final    BOTTLES DRAWN AEROBIC AND ANAEROBIC Blood Culture adequate volume   Culture   Final    NO GROWTH < 12 HOURS Performed at  [facility], [address], [place],  [place] [address]    Report Status PENDING  Incomplete    Radiology Studies: ECHOCARDIOGRAM COMPLETE  Result Date: 12/18/2022    ECHOCARDIOGRAM REPORT   Patient Name:   Joel [name] [facility] Date of Exam: 12/18/2022 Medical Rec #:  [id number]           Height:       70.0 in Accession #:    [id number]          Weight:       294.0 lb Date of Birth:  01/08/1996           BSA:          2.459 m Patient Age:    26 years            BP:           164/122 mmHg Patient Gender: M                   HR:           108 bpm. Exam Location:  Inpatient Procedure: 2D Echo, Cardiac Doppler, Color Doppler and Intracardiac            Opacification Agent Indications:    CHF  History:        Patient has prior history of Echocardiogram examinations, most                 recent 03/07/2022. Cardiomyopathy and CHF; Risk                 Factors:Hypertension.  Sonographer:    Darlys Gales Referring Phys: [phone] DEBBY CROSLEY IMPRESSIONS  1. There is no left ventricular thrombus (Definity contrast was used). Left ventricular ejection fraction, by estimation, is 15%. The left ventricle has severely decreased function. The left ventricle demonstrates global hypokinesis. The left ventricular internal cavity size was severely dilated. There is mild eccentric left ventricular hypertrophy. Indeterminate diastolic filling due to E-A fusion.  2. Right ventricular systolic function is normal. The right ventricular size is normal. Tricuspid regurgitation signal is inadequate for assessing PA pressure.  3. Left atrial size was severely dilated.  4. A small pericardial effusion is present.  5. The mitral valve is normal in structure. Mild mitral valve regurgitation.  6. The aortic valve is tricuspid. Aortic valve regurgitation is not visualized. No aortic stenosis is present. Comparison(s): Prior images reviewed side by side. The left ventricular function is significantly worse. The left ventricle is now severely  dilated. FINDINGS  Left Ventricle: There is no left ventricular thrombus (Definity contrast was used). Left ventricular ejection fraction, by estimation, is 15%. The left ventricle has severely decreased function. The left ventricle demonstrates global hypokinesis. Definity contrast agent was given IV to delineate the left ventricular endocardial borders. The left ventricular internal cavity size was severely dilated. There is mild eccentric left ventricular hypertrophy. Indeterminate diastolic filling due to E-A fusion. Right Ventricle: The right ventricular size is normal. No increase in right ventricular wall thickness. Right ventricular systolic function is normal. Tricuspid regurgitation signal is inadequate for assessing PA pressure. Left Atrium: Left atrial size was severely dilated. Right Atrium: Right atrial size was normal in size. Pericardium: A small pericardial effusion is present. Mitral Valve: The mitral valve is normal in structure. Mild mitral valve regurgitation, with centrally-directed jet. Tricuspid Valve: The tricuspid valve is normal in structure. Tricuspid valve regurgitation is not demonstrated. Aortic Valve: The aortic valve is tricuspid. Aortic valve regurgitation is not visualized. No aortic stenosis is  present. Aortic valve mean gradient measures 2.0 mmHg. Aortic valve peak gradient measures 3.1 mmHg. Aortic valve area, by VTI measures 3.55 cm. Pulmonic Valve: The pulmonic valve was not well visualized. Pulmonic valve regurgitation is not visualized. No evidence of pulmonic stenosis. Aorta: The aortic root and ascending aorta are structurally normal, with no evidence of dilitation. Venous: The inferior vena cava was not well visualized. IAS/Shunts: No atrial level shunt detected by color flow Doppler.  LEFT VENTRICLE PLAX 2D LVIDd:         7.40 cm   Diastology LVIDs:         6.20 cm   LV e' medial:    7.29 cm/s LV PW:         1.30 cm   LV E/e' medial:  13.9 LV IVS:        1.30 cm   LV e'  lateral:   9.90 cm/s LVOT diam:     2.50 cm   LV E/e' lateral: 10.2 LV SV:         42 LV SV Index:   17 LVOT Area:     4.91 cm  LEFT ATRIUM              Index        RIGHT ATRIUM           Index LA Vol (A2C):   53.6 ml  21.80 ml/m  RA Area:     11.10 cm LA Vol (A4C):   105.0 ml 42.71 ml/m  RA Volume:   23.70 ml  9.64 ml/m LA Biplane Vol: 77.0 ml  31.32 ml/m  AORTIC VALVE AV Area (Vmax):    3.66 cm AV Area (Vmean):   3.30 cm AV Area (VTI):     3.55 cm AV Vmax:           87.50 cm/s AV Vmean:          66.700 cm/s AV VTI:            0.119 m AV Peak Grad:      3.1 mmHg AV Mean Grad:      2.0 mmHg LVOT Vmax:         65.30 cm/s LVOT Vmean:        44.800 cm/s LVOT VTI:          0.086 m LVOT/AV VTI ratio: 0.72  AORTA Ao Root diam: 3.80 cm Ao Asc diam:  3.70 cm MITRAL VALVE MV Area (PHT): 6.88 cm     SHUNTS MV E velocity: 101.00 cm/s  Systemic VTI:  0.09 m                             Systemic Diam: 2.50 cm Rachelle Hora Croitoru MD Electronically signed by Thurmon Fair MD Signature Date/Time: 12/18/2022/1:44:46 PM    Final    CT Angio Chest PE W and/or Wo Contrast  Result Date: 12/17/2022 CLINICAL DATA:  Tachycardia, hemoptysis, tachypnea, pleuritic chest pain EXAM: CT ANGIOGRAPHY CHEST WITH CONTRAST TECHNIQUE: Multidetector CT imaging of the chest was performed using the standard protocol during bolus administration of intravenous contrast. Multiplanar CT image reconstructions and MIPs were obtained to evaluate the vascular anatomy. RADIATION DOSE REDUCTION: This exam was performed according to the departmental dose-optimization program which includes automated exposure control, adjustment of the mA and/or kV according to patient size and/or use of iterative reconstruction technique. CONTRAST:  75mL OMNIPAQUE IOHEXOL 350 MG/ML SOLN COMPARISON:  04/23/2020  FINDINGS: Cardiovascular: SVC patent. Mild cardiomegaly predominantly left-sided. No pericardial effusion. The RV is nondilated. Satisfactory opacification of  pulmonary arteries noted, and there is no evidence of pulmonary emboli. Incomplete opacification of the thoracic aorta, without aneurysm. Mediastinum/Nodes: No mass. 1.1 cm AP window lymph node stable since previous. No hilar adenopathy. Lungs/Pleura: Trace left pleural effusion. No pneumothorax. Extensive ground-glass opacities throughout both lower lobes and posterior upper lobes right greater than left, new since previous. Upper Abdomen: No acute findings. Musculoskeletal: No chest wall abnormality. No acute or significant osseous findings. Review of the MIP images confirms the above findings. IMPRESSION: 1. Negative for acute PE or thoracic aortic dissection. 2. Extensive bilateral ground-glass opacities, right greater than left, with trace left pleural effusion. Considerations include asymmetric edema versus infectious/inflammatory process. 3. Mild cardiomegaly. Electronically Signed   By: Corlis Leak M.D.   On: 12/17/2022 20:22   DG Chest 2 View  Result Date: 12/17/2022 CLINICAL DATA:  Shortness of breath for a month EXAM: CHEST - 2 VIEW COMPARISON:  X-ray 04/24/2020 FINDINGS: Underinflation with bronchovascular crowding. Mild peribronchial thickening. No consolidation, pneumothorax, effusion or edema. Overlapping cardiac leads. Normal cardiopericardial silhouette. IMPRESSION: Underinflation.  Mild peribronchial thickening. Electronically Signed   By: Karen Kays M.D.   On: 12/17/2022 18:22      Janalee Grobe T. Adonay Scheier [facility]  If 7PM-7AM, please contact night-coverage [email or web address] 12/18/2022, 4:42 PM

## 2022-12-18 NOTE — Progress Notes (Signed)
   12/17/22 2337  Assess: MEWS Score  Temp 98.1 F (36.7 C)  BP (!) 166/126  MAP (mmHg) 133  Pulse Rate (!) 128  ECG Heart Rate (!) 133  Resp (!) 22  Level of Consciousness Alert  SpO2 97 %  O2 Device Room Air  Assess: MEWS Score  MEWS Temp 0  MEWS Systolic 0  MEWS Pulse 3  MEWS RR 1  MEWS LOC 0  MEWS Score 4  MEWS Score Color Red  Assess: if the MEWS score is Yellow or Red  Were vital signs taken at a resting state? Yes  Focused Assessment No change from prior assessment  Does the patient meet 2 or more of the SIRS criteria? No  MEWS guidelines implemented  Yes, red  Treat  MEWS Interventions Considered administering scheduled or prn medications/treatments as ordered  Take Vital Signs  Increase Vital Sign Frequency  Red: Q1hr x2, continue Q4hrs until patient remains green for 12hrs  Escalate  MEWS: Escalate Red: Discuss with charge nurse and notify provider. Consider notifying RRT. If remains red for 2 hours consider need for higher level of care  Notify: Charge Nurse/RN  Name of Charge Nurse/RN Notified [place], RN  Provider Notification  Provider Name/Title Gery Pray  Date Provider Notified 12/18/22  Time Provider Notified 0014  Method of Notification Page (Secure chat)  Notification Reason Other (Comment) (MEWS Red)  Provider response See new orders  Date of Provider Response 12/18/22  Time of Provider Response 0023  Assess: SIRS CRITERIA  SIRS Temperature  0  SIRS Pulse 1  SIRS Respirations  1  SIRS WBC 0  SIRS Score Sum  2

## 2022-12-19 ENCOUNTER — Other Ambulatory Visit (HOSPITAL_COMMUNITY): Payer: Self-pay

## 2022-12-19 ENCOUNTER — Telehealth (HOSPITAL_COMMUNITY): Payer: Self-pay

## 2022-12-19 DIAGNOSIS — I10 Essential (primary) hypertension: Secondary | ICD-10-CM | POA: Diagnosis not present

## 2022-12-19 DIAGNOSIS — I420 Dilated cardiomyopathy: Secondary | ICD-10-CM | POA: Diagnosis not present

## 2022-12-19 DIAGNOSIS — I5021 Acute systolic (congestive) heart failure: Secondary | ICD-10-CM | POA: Diagnosis not present

## 2022-12-19 LAB — PHOSPHORUS: Phosphorus: 5 mg/dL — ABNORMAL HIGH (ref 2.5–4.6)

## 2022-12-19 LAB — COMPREHENSIVE METABOLIC PANEL
ALT: 61 U/L — ABNORMAL HIGH (ref 0–44)
AST: 38 U/L (ref 15–41)
Albumin: 3.5 g/dL (ref 3.5–5.0)
Alkaline Phosphatase: 45 U/L (ref 38–126)
Anion gap: 12 (ref 5–15)
BUN: 19 mg/dL (ref 6–20)
CO2: 19 mmol/L — ABNORMAL LOW (ref 22–32)
Calcium: 9 mg/dL (ref 8.9–10.3)
Chloride: 104 mmol/L (ref 98–111)
Creatinine, Ser: 1.22 mg/dL (ref 0.61–1.24)
GFR, Estimated: >60 mL/min (ref >60)
Glucose, Bld: 102 mg/dL — ABNORMAL HIGH (ref 70–99)
Potassium: 3.6 mmol/L (ref 3.5–5.1)
Sodium: 135 mmol/L (ref 135–145)
Total Bilirubin: 0.8 mg/dL (ref 0.3–1.2)
Total Protein: 6.3 g/dL — ABNORMAL LOW (ref 6.5–8.1)

## 2022-12-19 LAB — CBC
HCT: 47 % (ref 39.0–52.0)
Hemoglobin: 16.4 g/dL (ref 13.0–17.0)
MCH: 32.6 pg (ref 26.0–34.0)
MCHC: 34.9 g/dL (ref 30.0–36.0)
MCV: 93.4 fL (ref 80.0–100.0)
Platelets: 269 10*3/uL (ref 150–400)
RBC: 5.03 MIL/uL (ref 4.22–5.81)
RDW: 16 % — ABNORMAL HIGH (ref 11.5–15.5)
WBC: 10 10*3/uL (ref 4.0–10.5)
nRBC: 0 % (ref 0.0–0.2)

## 2022-12-19 LAB — PROCALCITONIN: Procalcitonin: <0.1 ng/mL

## 2022-12-19 LAB — MAGNESIUM: Magnesium: 2.1 mg/dL (ref 1.7–2.4)

## 2022-12-19 MED ORDER — SACUBITRIL-VALSARTAN 49-51 MG PO TABS
1.0000 | ORAL_TABLET | Freq: Two times a day (BID) | ORAL | Status: DC
  Administered 2022-12-19 – 2022-12-21 (×4): 1 via ORAL
  Filled 2022-12-19 (×5): qty 1

## 2022-12-19 MED ORDER — DAPAGLIFLOZIN PROPANEDIOL 10 MG PO TABS
10.0000 mg | ORAL_TABLET | Freq: Every day | ORAL | Status: DC
  Administered 2022-12-19 – 2022-12-21 (×3): 10 mg via ORAL
  Filled 2022-12-19 (×3): qty 1

## 2022-12-19 MED ORDER — SPIRONOLACTONE 12.5 MG HALF TABLET
12.5000 mg | ORAL_TABLET | Freq: Every day | ORAL | Status: DC
  Administered 2022-12-19 – 2022-12-20 (×2): 12.5 mg via ORAL
  Filled 2022-12-19 (×2): qty 1

## 2022-12-19 MED ORDER — SACUBITRIL-VALSARTAN 24-26 MG PO TABS
1.0000 | ORAL_TABLET | Freq: Once | ORAL | Status: AC
  Administered 2022-12-19: 1 via ORAL
  Filled 2022-12-19: qty 1

## 2022-12-19 MED ORDER — POTASSIUM CHLORIDE CRYS ER 20 MEQ PO TBCR
40.0000 meq | EXTENDED_RELEASE_TABLET | Freq: Once | ORAL | Status: AC
  Administered 2022-12-19: 40 meq via ORAL
  Filled 2022-12-19: qty 2

## 2022-12-19 NOTE — Progress Notes (Signed)
Advanced Heart Failure Rounding Note  PCP-Cardiologist: None   Subjective:    Diuresing w/ IV Lasix. Wt down 4 lb. SCr improving, 1.46>>1.22. K 3.6  Remains hypertensive. Breathing improved. No chest pain.    Objective:   Weight Range: 131.5 kg Body mass index is 41.61 kg/m.   Vital Signs:   Temp:  [97.8 F (36.6 C)-98.3 F (36.8 C)] 97.8 F (36.6 C) (06/19 1151) Pulse Rate:  [103-112] 107 (06/19 1151) Resp:  [17-20] 18 (06/19 1151) BP: (142-163)/(96-122) 152/114 (06/19 1151) SpO2:  [90 %] 90 % (06/18 1200) Weight:  [131.5 kg] 131.5 kg (06/19 0533) Last BM Date : 12/18/22  Weight change: Filed Weights   12/17/22 1741 12/17/22 2337 12/19/22 0533  Weight: (!) 137.4 kg 133.4 kg 131.5 kg    Intake/Output:   Intake/Output Summary (Last 24 hours) at 12/19/2022 1158 Last data filed at 12/19/2022 1152 Gross per 24 hour  Intake 460 ml  Output 2700 ml  Net -2240 ml      Physical Exam    General:  Well appearing young male. No resp difficulty HEENT: Normal Neck: Supple. JVP elevated to jaw. Carotids 2+ bilat; no bruits. No lymphadenopathy or thyromegaly appreciated. Cor: PMI nondisplaced. Regular rhythm, tachy rate. No rubs, gallops or murmurs. Lungs: Clear Abdomen: Soft, nontender, nondistended. No hepatosplenomegaly. No bruits or masses. Good bowel sounds. Extremities: No cyanosis, clubbing, rash, 1+ b/l LE edema Neuro: Alert & orientedx3, cranial nerves grossly intact. moves all 4 extremities w/o difficulty. Affect pleasant   Telemetry   Sinus tach 110s   EKG    N/A  Labs    CBC Recent Labs    12/17/22 1839 12/17/22 1847 12/18/22 0251 12/19/22 0013  WBC 10.7*  --  12.1* 10.0  NEUTROABS 7.8*  --  8.7*  --   HGB 16.8   < > 16.7 16.4  HCT 50.0   < > 46.7 47.0  MCV 96.7  --  91.2 93.4  PLT 291  --  244 269   < > = values in this interval not displayed.   Basic Metabolic Panel Recent Labs    54/09/81 0251 12/18/22 0835 12/19/22 0013  NA   --  137 135  K  --  4.0 3.6  CL  --  104 104  CO2  --  19* 19*  GLUCOSE  --  124* 102*  BUN  --  13 19  CREATININE  --  1.46* 1.22  CALCIUM  --  9.1 9.0  MG 2.2  --  2.1  PHOS 4.5 4.9* 5.0*   Liver Function Tests Recent Labs    12/17/22 2341 12/18/22 0835 12/19/22 0013  AST 41  --  38  ALT 67*  --  61*  ALKPHOS 51  --  45  BILITOT 1.4*  --  0.8  PROT 6.7  --  6.3*  ALBUMIN 3.7 3.5 3.5   No results for input(s): "LIPASE", "AMYLASE" in the last 72 hours. Cardiac Enzymes No results for input(s): "CKTOTAL", "CKMB", "CKMBINDEX", "TROPONINI" in the last 72 hours.  BNP: BNP (last 3 results) Recent Labs    03/07/22 1120 12/17/22 1839  BNP 13.9 510.8*    ProBNP (last 3 results) No results for input(s): "PROBNP" in the last 8760 hours.   D-Dimer No results for input(s): "DDIMER" in the last 72 hours. Hemoglobin A1C No results for input(s): "HGBA1C" in the last 72 hours. Fasting Lipid Panel No results for input(s): "CHOL", "HDL", "LDLCALC", "TRIG", "CHOLHDL", "LDLDIRECT"  in the last 72 hours. Thyroid Function Tests No results for input(s): "TSH", "T4TOTAL", "T3FREE", "THYROIDAB" in the last 72 hours.  Invalid input(s): "FREET3"  Other results:   Imaging    ECHOCARDIOGRAM COMPLETE  Result Date: 12/18/2022    ECHOCARDIOGRAM REPORT   Patient Name:   Joel [name] [facility] Date of Exam: 12/18/2022 Medical Rec #:  [id number]           Height:       70.0 in Accession #:    [id number]          Weight:       294.0 lb Date of Birth:  1996-05-06           BSA:          2.459 m Patient Age:    26 years            BP:           164/122 mmHg Patient Gender: M                   HR:           108 bpm. Exam Location:  Inpatient Procedure: 2D Echo, Cardiac Doppler, Color Doppler and Intracardiac            Opacification Agent Indications:    CHF  History:        Patient has prior history of Echocardiogram examinations, most                 recent 03/07/2022. Cardiomyopathy and CHF; Risk                  Factors:Hypertension.  Sonographer:    Darlys Gales Referring Phys: [phone] DEBBY CROSLEY IMPRESSIONS  1. There is no left ventricular thrombus (Definity contrast was used). Left ventricular ejection fraction, by estimation, is 15%. The left ventricle has severely decreased function. The left ventricle demonstrates global hypokinesis. The left ventricular internal cavity size was severely dilated. There is mild eccentric left ventricular hypertrophy. Indeterminate diastolic filling due to E-A fusion.  2. Right ventricular systolic function is normal. The right ventricular size is normal. Tricuspid regurgitation signal is inadequate for assessing PA pressure.  3. Left atrial size was severely dilated.  4. A small pericardial effusion is present.  5. The mitral valve is normal in structure. Mild mitral valve regurgitation.  6. The aortic valve is tricuspid. Aortic valve regurgitation is not visualized. No aortic stenosis is present. Comparison(s): Prior images reviewed side by side. The left ventricular function is significantly worse. The left ventricle is now severely dilated. FINDINGS  Left Ventricle: There is no left ventricular thrombus (Definity contrast was used). Left ventricular ejection fraction, by estimation, is 15%. The left ventricle has severely decreased function. The left ventricle demonstrates global hypokinesis. Definity contrast agent was given IV to delineate the left ventricular endocardial borders. The left ventricular internal cavity size was severely dilated. There is mild eccentric left ventricular hypertrophy. Indeterminate diastolic filling due to E-A fusion. Right Ventricle: The right ventricular size is normal. No increase in right ventricular wall thickness. Right ventricular systolic function is normal. Tricuspid regurgitation signal is inadequate for assessing PA pressure. Left Atrium: Left atrial size was severely dilated. Right Atrium: Right atrial size was normal in size.  Pericardium: A small pericardial effusion is present. Mitral Valve: The mitral valve is normal in structure. Mild mitral valve regurgitation, with centrally-directed jet. Tricuspid Valve: The tricuspid valve is normal in structure. Tricuspid valve regurgitation  is not demonstrated. Aortic Valve: The aortic valve is tricuspid. Aortic valve regurgitation is not visualized. No aortic stenosis is present. Aortic valve mean gradient measures 2.0 mmHg. Aortic valve peak gradient measures 3.1 mmHg. Aortic valve area, by VTI measures 3.55 cm. Pulmonic Valve: The pulmonic valve was not well visualized. Pulmonic valve regurgitation is not visualized. No evidence of pulmonic stenosis. Aorta: The aortic root and ascending aorta are structurally normal, with no evidence of dilitation. Venous: The inferior vena cava was not well visualized. IAS/Shunts: No atrial level shunt detected by color flow Doppler.  LEFT VENTRICLE PLAX 2D LVIDd:         7.40 cm   Diastology LVIDs:         6.20 cm   LV e' medial:    7.29 cm/s LV PW:         1.30 cm   LV E/e' medial:  13.9 LV IVS:        1.30 cm   LV e' lateral:   9.90 cm/s LVOT diam:     2.50 cm   LV E/e' lateral: 10.2 LV SV:         42 LV SV Index:   17 LVOT Area:     4.91 cm  LEFT ATRIUM              Index        RIGHT ATRIUM           Index LA Vol (A2C):   53.6 ml  21.80 ml/m  RA Area:     11.10 cm LA Vol (A4C):   105.0 ml 42.71 ml/m  RA Volume:   23.70 ml  9.64 ml/m LA Biplane Vol: 77.0 ml  31.32 ml/m  AORTIC VALVE AV Area (Vmax):    3.66 cm AV Area (Vmean):   3.30 cm AV Area (VTI):     3.55 cm AV Vmax:           87.50 cm/s AV Vmean:          66.700 cm/s AV VTI:            0.119 m AV Peak Grad:      3.1 mmHg AV Mean Grad:      2.0 mmHg LVOT Vmax:         65.30 cm/s LVOT Vmean:        44.800 cm/s LVOT VTI:          0.086 m LVOT/AV VTI ratio: 0.72  AORTA Ao Root diam: 3.80 cm Ao Asc diam:  3.70 cm MITRAL VALVE MV Area (PHT): 6.88 cm     SHUNTS MV E velocity: 101.00 cm/s   Systemic VTI:  0.09 m                             Systemic Diam: 2.50 cm Mihai Croitoru MD Electronically signed by Thurmon Fair MD Signature Date/Time: 12/18/2022/1:44:46 PM    Final      Medications:     Scheduled Medications:  carvedilol  6.25 mg Oral BID WC   digoxin  0.125 mg Oral Daily   enoxaparin (LOVENOX) injection  40 mg Subcutaneous Q24H   folic acid  1 mg Oral Daily   furosemide  40 mg Intravenous BID   hydrALAZINE  50 mg Oral Q8H   multivitamin with minerals  1 tablet Oral Daily   nicotine  21 mg Transdermal Daily   potassium chloride  40  mEq Oral Once   sodium chloride flush  3 mL Intravenous Q12H   thiamine  100 mg Oral Daily   Or   thiamine  100 mg Intravenous Daily    Infusions:  sodium chloride      PRN Medications: sodium chloride, acetaminophen, albuterol, LORazepam **OR** LORazepam, metoprolol tartrate, ondansetron (ZOFRAN) IV, mouth rinse, sodium chloride flush    Patient Profile   27 y/o male w/ chronic systolic heart failure due to NICM, suspected viral myocarditis +/- drug induced, h/o LV thrombus, prior cardio embolic CVA, HTN and polysubstance abuse, admitted w/ acute on chronic systolic heart failure in setting of med noncompliance and ongoing substance use.   Assessment/Plan   1. Acute on Chronic Systolic Heart Failure - NICM. Diagnosed post COVID infection in 2021 - Echo 10/21 EF < 20% + LV thrombus. CMRI with LVEF 15%, RVEF 19%, LGE pattern concerning for myocarditis. Suspect most likely etiology for cardiomyopathy is viral myocarditis due to COVID-19 though methamphetamine use may have contributed as well.  - Echo in 3/22 showed improved EF to 35%.  - Echo 9/22 showed EF up to 45%.  - lost to f/u. Stopped all GDMT + ongoing substance use. Now admitted w/ a/c CHF. Admission labs concerning for low output. LA 6.5>>1.2, CO2 16>>19 today. Echo w/ severely reduced LVEF 15%, RV normal.  - he is diuresing w/ IV Lasix. Remains fluid overloaded,  tachycardic and hypertensive. Renal fx improving. CO2 19 - Continue IV Lasix 40 mg bid.  - Continue digoxin 0.125 mg daily  - he is on Coreg 6.25 mg bid. Continue at current dose, avoid further up titration until better diuresed  - Transition to Entresto 49-51 mg bid - Start Spiro 12.5 mg daily  - Start Farxiga 10 mg daily  - Stop Hydralazine  - reiterated importance of discontinuation of substance use (UDS + for amphetamines) - not a candidate for advanced therapies given noncompliance and substance use    2. Hypertension  - BP elevated 180s systolic - GDMT titration per above    3. AKI - b/l SCr 0.8-1.0  - 1.3 on admit>>1.46>>1.22 today w/ diuresis  - suspect cardiorenal  - diuresis + GDMT per above  - follow BMP   4. Polysubstance Use/ Stimulant Use Disorder - UDS + for amphetamines and cannabis - psych following  - imperative to quit  - TOC consulted for substance abuse resources    5. H/o LV Thrombus w/ Cardioembolic CVA - occurred in 2021 - previously on Coumadin, but had self discontinued all meds - Echo this admit w/ severely dilated LV, severely reduced systolic fx. No LV thrombus w/ definity contrast  - given persistent, severely reduced LVEF would recommend restarting anticoagulation for secondary prevention    Length of Stay: 2  Robbie Lis, PA-C  12/19/2022, 11:58 AM  Advanced Heart Failure Team Pager [phone] (M-F; 7a - 5p)  Please contact [facility] for night-coverage after hours (5p -7a ) and weekends on [email or web address]

## 2022-12-19 NOTE — Telephone Encounter (Signed)
Copays were relayed to inpatient team

## 2022-12-19 NOTE — Progress Notes (Signed)
PROGRESS NOTE  Joel [name]:[id number] DOB: July 11, 1995   PCP: Blane Ohara, MD  Patient is from: Home  DOA: 12/17/2022 LOS: 2  Chief complaints Chief Complaint  Patient presents with   Hypertension   Tachycardia   Shortness of Breath     Brief Narrative / Interim history: 27 year old M with PMH of COVID-related DCM, LV thrombus, CVA, polysubstance use including meth, cocaine, marijuana, alcohol and tobacco and noncompliance presenting with progressive shortness of breath for 1 months and orthopnea for about a week, and admitted for acute on chronic systolic CHF and AKI.  BNP elevated to 510.  CXR with mild cardiomegaly, asymmetric edema versus infection.  Troponin elevated 67>> 89.  Lactic acid 6.5 but quickly resolved.  To he was started on IV Lasix and IV ceftriaxone.  Cardiology consulted and TTE ordered.  The next day, TTE with LVEF of 15%, GH, indeterminate DD, severe LAE and normal RVSF.  Symptoms improved with IV Lasix.   Remains on IV Lasix.  Cardiology titrating GDMT.  Subjective: Seen and examined earlier this morning.  No major events overnight of this morning.  No complaints.  Denies shortness of breath, chest pain, orthopnea or PND.  Denies GI or UTI symptoms.  2.3 L UOP/24 hours.  Net -5.4 L so far.  Objective: Vitals:   12/18/22 2124 12/19/22 0533 12/19/22 0737 12/19/22 1151  BP: (!) 152/116 (!) 156/122 (!) 163/118 (!) 152/114  Pulse:  (!) 112 (!) 103 (!) 107  Resp: 20 18 17 18   Temp: 98.2 F (36.8 C) 98.3 F (36.8 C) 97.9 F (36.6 C) 97.8 F (36.6 C)  TempSrc: Oral Oral Oral Oral  SpO2:      Weight:  131.5 kg    Height:        Examination:  GENERAL: No apparent distress.  Nontoxic. HEENT: MMM.  Vision and hearing grossly intact.  NECK: Supple.  No apparent JVD.  RESP:  No IWOB.  Fair aeration bilaterally. CVS:  RRR. Heart sounds normal.  ABD/GI/GU: BS+. Abd soft, NTND.  MSK/EXT:  Moves extremities. No apparent deformity. No edema.  SKIN:  no apparent skin lesion or wound NEURO: Awake, alert and oriented appropriately.  No apparent focal neuro deficit. PSYCH: Calm. Normal affect.   Procedures:  None  Microbiology summarized: Blood cultures NGTD  Assessment and plan: Principal Problem:   Acute systolic (congestive) heart failure (HCC) Active Problems:   Dilated cardiomyopathy (HCC)   Class 3 severe obesity due to excess calories with body mass index (BMI) of 40.0 to 44.9 in adult [facility])   Mixed hyperlipidemia   Primary hypertension   H/O: CVA (cerebrovascular accident)  Acute on chronic systolic CHF/history of DCM: TTE with LVEF of 15%, GH, severe LAE and normal RVSP.  Presented with progressive dyspnea and orthopnea in the setting of noncompliance with meds and ongoing use of polysubstance.  Symptoms improved with IV Lasix.  Creatinine improved.  Hemodynamically stable. -Cardiology following-on IV Lasix -Continue IV Lasix, digoxin, Coreg, Farxiga, Entresto and Aldactone. -Strict intake and output, daily weight, renal functions and electrolytes -Counseled on the importance of substance use cessation.  History of LV thrombus/cardioembolic CVA: Does not seem to be on anticoagulation.  No report of thrombus on current TTE. -Defer anticoagulation to cardiology.  Uncontrolled hypertension: Likely due to noncompliance and polysubstance use. -Cardiac meds as above. -Further adjustment as appropriate   NFA:OZHYQ be due to polysubstance use and CHF. Not on nephrotoxic meds at home.   Recent Labs  03/07/22 1130 12/17/22 1839 12/17/22 1847 12/17/22 2341 12/18/22 0835 12/19/22 0013  BUN 12 12 13 12 13 19   CREATININE 1.02 1.28* 1.30* 1.36* 1.46* 1.22  -Continue monitoring  Alcohol use disorder: No withdrawal symptoms.  Reports drinking 12 to 15 g/coke on the weekends. -Continue CIWA with as needed Ativan -Continue thiamine, folic acid and multivitamin  Polysubstance use: UDS positive for amphetamine and marijuana.   Also admits to using cocaine, alcohol and tobacco. -Encourage cessation -Nicotine patch -TOC consult.  Elevated troponin: Likely due to CHF and AKI.  Doubt ACS.  TTE as above. -Defer to cardiology   Lactic acidosis: Resolved quickly.  Could be related to alcohol use  Prolonged QTC -Optimize electrolytes -Avoid or minimize QT prolonging drugs  ADHD: Does not seem to be on meds at home. -Outpatient follow-up  Pneumonia ruled out.  Discontinue ceftriaxone  Morbid obesity Body mass index is 41.61 kg/m.          DVT prophylaxis:  enoxaparin (LOVENOX) injection 40 mg Start: 12/18/22 1730  Code Status: Full code Family Communication: None at bedside Level of care: Telemetry Cardiac Status is: Inpatient Remains inpatient appropriate because: Acute CHF, AKI   Final disposition: Home Consultants:  Cardiology Psychiatry (signed off)  55 minutes with more than 50% spent in reviewing records, counseling patient/family and coordinating care.   Sch Meds:  Scheduled Meds:  carvedilol  6.25 mg Oral BID WC   dapagliflozin propanediol  10 mg Oral Daily   digoxin  0.125 mg Oral Daily   enoxaparin (LOVENOX) injection  40 mg Subcutaneous Q24H   folic acid  1 mg Oral Daily   furosemide  40 mg Intravenous BID   multivitamin with minerals  1 tablet Oral Daily   nicotine  21 mg Transdermal Daily   sacubitril-valsartan  1 tablet Oral BID   sodium chloride flush  3 mL Intravenous Q12H   spironolactone  12.5 mg Oral Daily   thiamine  100 mg Oral Daily   Or   thiamine  100 mg Intravenous Daily   Continuous Infusions:  sodium chloride     PRN Meds:.sodium chloride, acetaminophen, albuterol, LORazepam **OR** LORazepam, metoprolol tartrate, ondansetron (ZOFRAN) IV, mouth rinse, sodium chloride flush  Antimicrobials: Anti-infectives (From admission, onward)    Start     Dose/Rate Route Frequency Ordered Stop   12/18/22 0900  cefTRIAXone (ROCEPHIN) 2 g in sodium chloride 0.9 % 100  mL IVPB  Status:  Discontinued        2 g 200 mL/hr over 30 Minutes Intravenous Every 24 hours 12/18/22 0807 12/19/22 0807   12/17/22 1815  cefTRIAXone (ROCEPHIN) 2 g in sodium chloride 0.9 % 100 mL IVPB        2 g 200 mL/hr over 30 Minutes Intravenous  Once 12/17/22 1809 12/17/22 2126        I have personally reviewed the following labs and images: CBC: Recent Labs  Lab 12/17/22 1839 12/17/22 1847 12/18/22 0251 12/19/22 0013  WBC 10.7*  --  12.1* 10.0  NEUTROABS 7.8*  --  8.7*  --   HGB 16.8 16.7 16.7 16.4  HCT 50.0 49.0 46.7 47.0  MCV 96.7  --  91.2 93.4  PLT 291  --  244 269   BMP &GFR Recent Labs  Lab 12/17/22 1839 12/17/22 1847 12/17/22 2341 12/18/22 0251 12/18/22 0835 12/19/22 0013  NA 138 136 135  --  137 135  K 4.3 4.0 3.8  --  4.0 3.6  CL 106 109  102  --  104 104  CO2 16*  --  22  --  19* 19*  GLUCOSE 80 79 109*  --  124* 102*  BUN 12 13 12   --  13 19  CREATININE 1.28* 1.30* 1.36*  --  1.46* 1.22  CALCIUM 9.0  --  8.9  --  9.1 9.0  MG  --   --  2.0 2.2  --  2.1  PHOS  --   --   --  4.5 4.9* 5.0*   Estimated Creatinine Clearance: 127.3 mL/min (by C-G formula based on SCr of 1.22 mg/dL). Liver & Pancreas: Recent Labs  Lab 12/17/22 2341 12/18/22 0835 12/19/22 0013  AST 41  --  38  ALT 67*  --  61*  ALKPHOS 51  --  45  BILITOT 1.4*  --  0.8  PROT 6.7  --  6.3*  ALBUMIN 3.7 3.5 3.5   No results for input(s): "LIPASE", "AMYLASE" in the last 168 hours. No results for input(s): "AMMONIA" in the last 168 hours. Diabetic: No results for input(s): "HGBA1C" in the last 72 hours. Recent Labs  Lab 12/17/22 1812  GLUCAP 94   Cardiac Enzymes: No results for input(s): "CKTOTAL", "CKMB", "CKMBINDEX", "TROPONINI" in the last 168 hours. No results for input(s): "PROBNP" in the last 8760 hours. Coagulation Profile: Recent Labs  Lab 12/17/22 2033  INR 1.1   Thyroid Function Tests: No results for input(s): "TSH", "T4TOTAL", "FREET4", "T3FREE",  "THYROIDAB" in the last 72 hours. Lipid Profile: No results for input(s): "CHOL", "HDL", "LDLCALC", "TRIG", "CHOLHDL", "LDLDIRECT" in the last 72 hours. Anemia Panel: No results for input(s): "VITAMINB12", "FOLATE", "FERRITIN", "TIBC", "IRON", "RETICCTPCT" in the last 72 hours. Urine analysis:    Component Value Date/Time   COLORURINE YELLOW 12/17/2022 1914   APPEARANCEUR CLEAR 12/17/2022 1914   LABSPEC 1.010 12/17/2022 1914   PHURINE 7.0 12/17/2022 1914   GLUCOSEU NEGATIVE 12/17/2022 1914   HGBUR NEGATIVE 12/17/2022 1914   BILIRUBINUR NEGATIVE 12/17/2022 1914   KETONESUR NEGATIVE 12/17/2022 1914   PROTEINUR NEGATIVE 12/17/2022 1914   NITRITE NEGATIVE 12/17/2022 1914   LEUKOCYTESUR NEGATIVE 12/17/2022 1914   Sepsis Labs: Invalid input(s): "PROCALCITONIN", "LACTICIDVEN"  Microbiology: Recent Results (from the past 240 hour(s))  Blood Culture (routine x 2)     Status: None (Preliminary result)   Collection Time: 12/17/22  6:39 PM   Specimen: BLOOD RIGHT ARM  Result Value Ref Range Status   Specimen Description BLOOD RIGHT ARM  Final   Special Requests   Final    BOTTLES DRAWN AEROBIC AND ANAEROBIC Blood Culture results may not be optimal due to an inadequate volume of blood received in culture bottles   Culture   Final    NO GROWTH 2 DAYS Performed at [facility], [address] [place], [place] [address]    Report Status PENDING  Incomplete  Blood Culture (routine x 2)     Status: None (Preliminary result)   Collection Time: 12/17/22  7:14 PM   Specimen: BLOOD RIGHT HAND  Result Value Ref Range Status   Specimen Description BLOOD RIGHT HAND  Final   Special Requests   Final    BOTTLES DRAWN AEROBIC AND ANAEROBIC Blood Culture adequate volume   Culture   Final    NO GROWTH 2 DAYS Performed at [facility], [address], [place], [place] [address]    Report Status PENDING  Incomplete    Radiology Studies: No results found.  Beryl Balz T. Elah Avellino [facility]ist  If 7PM-7AM, please contact night-coverage [email or web address] 12/19/2022, 1:04 PM

## 2022-12-19 NOTE — TOC Benefit Eligibility Note (Signed)
Pharmacy Patient Advocate Encounter  Insurance verification completed.    The patient is insured through [facility]    Ran test claim for Joel [name] and the current 30 day co-pay is $80.00.  Ran test claim for Joel [name] and the current 30 day co-pay is $80.00.  Ran test claim for Joel [name] and the current 30 day co-pay is $80.00.  This test claim was processed through [facility]- copay amounts may vary at other pharmacies due to pharmacy/plan contracts, or as the patient moves through the different stages of their insurance plan.

## 2022-12-20 ENCOUNTER — Other Ambulatory Visit (HOSPITAL_COMMUNITY): Payer: Self-pay

## 2022-12-20 DIAGNOSIS — I10 Essential (primary) hypertension: Secondary | ICD-10-CM | POA: Diagnosis not present

## 2022-12-20 DIAGNOSIS — I5021 Acute systolic (congestive) heart failure: Secondary | ICD-10-CM | POA: Diagnosis not present

## 2022-12-20 DIAGNOSIS — I420 Dilated cardiomyopathy: Secondary | ICD-10-CM | POA: Diagnosis not present

## 2022-12-20 LAB — COMPREHENSIVE METABOLIC PANEL
ALT: 68 U/L — ABNORMAL HIGH (ref 0–44)
AST: 41 U/L (ref 15–41)
Albumin: 3.8 g/dL (ref 3.5–5.0)
Alkaline Phosphatase: 39 U/L (ref 38–126)
Anion gap: 12 (ref 5–15)
BUN: 20 mg/dL (ref 6–20)
CO2: 19 mmol/L — ABNORMAL LOW (ref 22–32)
Calcium: 9 mg/dL (ref 8.9–10.3)
Chloride: 104 mmol/L (ref 98–111)
Creatinine, Ser: 1.3 mg/dL — ABNORMAL HIGH (ref 0.61–1.24)
GFR, Estimated: >60 mL/min (ref >60)
Glucose, Bld: 111 mg/dL — ABNORMAL HIGH (ref 70–99)
Potassium: 3.6 mmol/L (ref 3.5–5.1)
Sodium: 135 mmol/L (ref 135–145)
Total Bilirubin: 1.1 mg/dL (ref 0.3–1.2)
Total Protein: 6.8 g/dL (ref 6.5–8.1)

## 2022-12-20 LAB — MAGNESIUM: Magnesium: 2.4 mg/dL (ref 1.7–2.4)

## 2022-12-20 LAB — PHOSPHORUS: Phosphorus: 4.9 mg/dL — ABNORMAL HIGH (ref 2.5–4.6)

## 2022-12-20 MED ORDER — SPIRONOLACTONE 25 MG PO TABS
25.0000 mg | ORAL_TABLET | Freq: Every day | ORAL | Status: DC
  Administered 2022-12-21: 25 mg via ORAL
  Filled 2022-12-20: qty 1

## 2022-12-20 MED ORDER — APIXABAN 5 MG PO TABS
5.0000 mg | ORAL_TABLET | Freq: Two times a day (BID) | ORAL | Status: DC
  Administered 2022-12-20 – 2022-12-21 (×3): 5 mg via ORAL
  Filled 2022-12-20 (×3): qty 1

## 2022-12-20 MED ORDER — SPIRONOLACTONE 12.5 MG HALF TABLET
12.5000 mg | ORAL_TABLET | Freq: Once | ORAL | Status: AC
  Administered 2022-12-20: 12.5 mg via ORAL
  Filled 2022-12-20: qty 1

## 2022-12-20 MED ORDER — POTASSIUM CHLORIDE CRYS ER 20 MEQ PO TBCR
40.0000 meq | EXTENDED_RELEASE_TABLET | Freq: Once | ORAL | Status: AC
  Administered 2022-12-20: 40 meq via ORAL
  Filled 2022-12-20: qty 2

## 2022-12-20 NOTE — TOC Benefit Eligibility Note (Signed)
Pharmacy Patient Advocate Encounter  Insurance verification completed.    The patient is insured through [facility]    Ran test claim for [facility] and the current 30 day co-pay is $80.00.  Ran test claim for Marcelline Deist and the current 30 day co-pay is $80.00.  Ran test claim for Eliquis and the current 30 day co-pay is $80.00.  This test claim was processed through [facility]- copay amounts may vary at other pharmacies due to pharmacy/plan contracts, or as the patient moves through the different stages of their insurance plan.

## 2022-12-20 NOTE — Plan of Care (Signed)

## 2022-12-20 NOTE — Progress Notes (Addendum)
Advanced Heart Failure Rounding Note  PCP-Cardiologist: None   Subjective:    Continues to diurese with IV lasix. Weight down another 5 lb.   BMP improving but still above goal.  Feeling okay. Fatigued. Ambulated halls yesterday without difficulty.   He is considering rehab for substance abuse.  Objective:   Weight Range: 129.5 kg Body mass index is 40.96 kg/m.   Vital Signs:   Temp:  [97.8 F (36.6 C)-97.9 F (36.6 C)] 97.9 F (36.6 C) (06/20 0443) Pulse Rate:  [107-116] 107 (06/20 0443) Resp:  [16-20] 20 (06/20 0443) BP: (131-152)/(91-114) 131/102 (06/20 0443) SpO2:  [96 %] 96 % (06/20 0443) Weight:  [129.5 kg] 129.5 kg (06/20 0443) Last BM Date : 12/18/22  Weight change: Filed Weights   12/17/22 2337 12/19/22 0533 12/20/22 0443  Weight: 133.4 kg 131.5 kg 129.5 kg    Intake/Output:   Intake/Output Summary (Last 24 hours) at 12/20/2022 0955 Last data filed at 12/19/2022 2000 Gross per 24 hour  Intake 360 ml  Output 1900 ml  Net -1540 ml      Physical Exam    General:  Well appearing young male. HEENT: normal Neck: supple. JVP ~ 10 cm. Carotids 2+ bilat; no bruits. Cor: PMI nondisplaced. Regular rate & rhythm, tachy. No rubs, gallops or murmurs. Lungs: clear Abdomen: obeese, soft, nontender, nondistended. . Extremities: no cyanosis, clubbing, rash, trace edema Neuro: alert & orientedx3. Affect pleasant    Telemetry   Sinus tach 100s-110s  EKG    N/A  Labs    CBC Recent Labs    12/17/22 1839 12/17/22 1847 12/18/22 0251 12/19/22 0013  WBC 10.7*  --  12.1* 10.0  NEUTROABS 7.8*  --  8.7*  --   HGB 16.8   < > 16.7 16.4  HCT 50.0   < > 46.7 47.0  MCV 96.7  --  91.2 93.4  PLT 291  --  244 269   < > = values in this interval not displayed.   Basic Metabolic Panel Recent Labs    16/10/96 0013 12/20/22 0221  NA 135 135  K 3.6 3.6  CL 104 104  CO2 19* 19*  GLUCOSE 102* 111*  BUN 19 20  CREATININE 1.22 1.30*  CALCIUM 9.0 9.0   MG 2.1 2.4  PHOS 5.0* 4.9*   Liver Function Tests Recent Labs    12/19/22 0013 12/20/22 0221  AST 38 41  ALT 61* 68*  ALKPHOS 45 39  BILITOT 0.8 1.1  PROT 6.3* 6.8  ALBUMIN 3.5 3.8   No results for input(s): "LIPASE", "AMYLASE" in the last 72 hours. Cardiac Enzymes No results for input(s): "CKTOTAL", "CKMB", "CKMBINDEX", "TROPONINI" in the last 72 hours.  BNP: BNP (last 3 results) Recent Labs    03/07/22 1120 12/17/22 1839  BNP 13.9 510.8*    ProBNP (last 3 results) No results for input(s): "PROBNP" in the last 8760 hours.   D-Dimer No results for input(s): "DDIMER" in the last 72 hours. Hemoglobin A1C No results for input(s): "HGBA1C" in the last 72 hours. Fasting Lipid Panel No results for input(s): "CHOL", "HDL", "LDLCALC", "TRIG", "CHOLHDL", "LDLDIRECT" in the last 72 hours. Thyroid Function Tests No results for input(s): "TSH", "T4TOTAL", "T3FREE", "THYROIDAB" in the last 72 hours.  Invalid input(s): "FREET3"  Other results:   Imaging    No results found.   Medications:     Scheduled Medications:  carvedilol  6.25 mg Oral BID WC   dapagliflozin propanediol  10 mg  Oral Daily   digoxin  0.125 mg Oral Daily   enoxaparin (LOVENOX) injection  40 mg Subcutaneous Q24H   folic acid  1 mg Oral Daily   furosemide  40 mg Intravenous BID   multivitamin with minerals  1 tablet Oral Daily   nicotine  21 mg Transdermal Daily   sacubitril-valsartan  1 tablet Oral BID   sodium chloride flush  3 mL Intravenous Q12H   spironolactone  12.5 mg Oral Daily   thiamine  100 mg Oral Daily   Or   thiamine  100 mg Intravenous Daily    Infusions:  sodium chloride      PRN Medications: sodium chloride, acetaminophen, albuterol, LORazepam **OR** LORazepam, metoprolol tartrate, ondansetron (ZOFRAN) IV, mouth rinse, sodium chloride flush    Patient Profile   27 y/o male w/ chronic systolic heart failure due to NICM, suspected viral myocarditis +/- drug  induced, h/o LV thrombus, prior cardio embolic CVA, HTN and polysubstance abuse, admitted w/ acute on chronic systolic heart failure in setting of med noncompliance and ongoing substance use.   Assessment/Plan   1. Acute on Chronic Systolic Heart Failure - NICM. Diagnosed post COVID infection in 2021 - Echo 10/21 EF < 20% + LV thrombus. CMRI with LVEF 15%, RVEF 19%, LGE pattern concerning for myocarditis. Suspect most likely etiology for cardiomyopathy is viral myocarditis due to COVID-19 though methamphetamine use may have contributed as well.  - Echo in 3/22 showed improved EF to 35%.  - Echo 9/22 showed EF up to 45%.  - lost to f/u. Stopped all GDMT + ongoing substance use. Now admitted w/ a/c CHF. Admission labs concerning for low output. LA 6.5>>1.2, CO2 16>>19. Echo w/ severely reduced LVEF 15%, RV normal.  - Continue IV lasix 40 BID today. Hopefully ready for PO tomorrow. - Continue digoxin 0.125 mg daily  - Continue Coreg 6.25 mg bid. Avoid further up titration in setting of acute exacerbation with concern for low-output - Continue Entresto 49-51 mg bid - Increase Spiro to 25 mg daily  - Continue Farxiga 10 mg daily  - Remains tenuous with narrow pulse pressure and tachycardia. Will consider RHC tomorrow if he appears low-output - reiterated importance of discontinuation of substance use (UDS + for amphetamines) - not a candidate for advanced therapies given noncompliance and substance use    2. Hypertension  - BP remains elevated - GDMT titration per above  3. AKI - prior b/l SCr 0.8-1.0 in 2023 - 1.3 on admit>>1.46>>stable 1.03 today with diuresis  - suspect cardiorenal  - diuresis + GDMT per above  - follow BMP   4. Polysubstance Use/ Stimulant Use Disorder - UDS + for amphetamines and cannabis - psych following  - imperative to quit  - TOC consulted for substance abuse resources  - His sister has been assisting him with setting up [facility] for drug rehab after  dsicahrge   5. H/o LV Thrombus w/ Cardioembolic CVA - occurred in 2021 - Echo this admit w/ severely dilated LV, severely reduced systolic fx. No LV thrombus w/ definity contrast  - Restart anticoagulation for secondary prevention. D/t concern for compliance will use Eliquis 5 mg BID over warfarin.    Length of Stay: 3  Erlin Gardella N, PA-C  12/20/2022, 9:55 AM  Advanced Heart Failure Team Pager [phone] (M-F; 7a - 5p)  Please contact [facility] for night-coverage after hours (5p -7a ) and weekends on [email or web address]

## 2022-12-20 NOTE — Progress Notes (Signed)
PROGRESS NOTE  Joel [name] ZOX:[id number] DOB: 1995-07-16   PCP: Blane Ohara, MD  Patient is from: Home  DOA: 12/17/2022 LOS: 3  Chief complaints Chief Complaint  Patient presents with   Hypertension   Tachycardia   Shortness of Breath     Brief Narrative / Interim history: 27 year old M with PMH of COVID-related DCM, LV thrombus, CVA, polysubstance use including meth, cocaine, marijuana, alcohol and tobacco and noncompliance presenting with progressive shortness of breath for 1 months and orthopnea for about a week, and admitted for acute on chronic systolic CHF and AKI.  BNP elevated to 510.  CXR with mild cardiomegaly, asymmetric edema versus infection.  Troponin elevated 67>> 89.  Lactic acid 6.5 but quickly resolved.  To he was started on IV Lasix and IV ceftriaxone.  Cardiology consulted and TTE ordered.  The next day, TTE with LVEF of 15%, GH, indeterminate DD, severe LAE and normal RVSF.  Symptoms improved with IV Lasix.   Remains on IV Lasix.  Cardiology titrating GDMT.  Subjective: Seen and examined earlier this morning.  No major events overnight of this morning.  No complaints.  Denies chest pain, dyspnea, orthopnea, GI or UTI symptoms.   Objective: Vitals:   12/19/22 1558 12/19/22 1929 12/20/22 0443 12/20/22 1211  BP:  (!) 140/91 (!) 131/102 (!) 118/92  Pulse: (!) 111 (!) 109 (!) 107 100  Resp:  16 20 20   Temp:  97.8 F (36.6 C) 97.9 F (36.6 C) 98 F (36.7 C)  TempSrc:  Oral Oral Oral  SpO2:  96% 96% 95%  Weight:   129.5 kg   Height:        Examination:  GENERAL: No apparent distress.  Nontoxic. HEENT: MMM.  Vision and hearing grossly intact.  NECK: Supple.  No apparent JVD.  RESP:  No IWOB.  Fair aeration bilaterally. CVS:  RRR. Heart sounds normal.  ABD/GI/GU: BS+. Abd soft, NTND.  MSK/EXT:  Moves extremities. No apparent deformity. No edema.  SKIN: no apparent skin lesion or wound NEURO: Awake, alert and oriented appropriately.  No  apparent focal neuro deficit. PSYCH: Calm. Normal affect.   Procedures:  None  Microbiology summarized: Blood cultures NGTD  Assessment and plan: Principal Problem:   Acute systolic (congestive) heart failure (HCC) Active Problems:   Dilated cardiomyopathy (HCC)   Class 3 severe obesity due to excess calories with body mass index (BMI) of 40.0 to 44.9 in adult [facility])   Mixed hyperlipidemia   Primary hypertension   H/O: CVA (cerebrovascular accident)  Acute on chronic systolic CHF/history of DCM: TTE with LVEF of 15%, GH, severe LAE and normal RVSP.  Presented with progressive dyspnea and orthopnea in the setting of noncompliance with meds and ongoing use of polysubstance.  Symptoms improved with IV Lasix.  Net -6.4 L.  Hemodynamically stable. -Cardiology following-on IV Lasix 40 mg twice daily per cardiology -Continue IV Lasix, digoxin, Coreg, Farxiga, Entresto and Aldactone. -Strict intake and output, daily weight, renal functions and electrolytes -Counseled on the importance of substance use cessation.  History of LV thrombus/cardioembolic CVA: Does not seem to be on anticoagulation.  No report of thrombus on current TTE. -Started on Eliquis by cardiology  Uncontrolled hypertension: Likely due to noncompliance and polysubstance use. -Cardiac meds as above. -Further adjustment as appropriate   WJX:BJYNW be due to polysubstance use and CHF. Not on nephrotoxic meds at home.   Recent Labs    03/07/22 1130 12/17/22 1839 12/17/22 1847 12/17/22 2341 12/18/22 2956 12/19/22 0013  12/20/22 0221  BUN 12 12 13 12 13 19 20   CREATININE 1.02 1.28* 1.30* 1.36* 1.46* 1.22 1.30*  -Continue monitoring  Alcohol use disorder: No withdrawal symptoms.  Reports drinking 12 to 15 g/coke on the weekends. -Continue CIWA with as needed Ativan -Continue thiamine, folic acid and multivitamin  Polysubstance use: UDS positive for amphetamine and marijuana.  Also admits to using cocaine, alcohol and  tobacco. -Encouraged cessation -Nicotine patch -TOC consult.  Elevated troponin: Likely due to CHF and AKI.  Doubt ACS.  TTE as above. -Defer to cardiology   Lactic acidosis: Resolved quickly.  Could be related to alcohol use  Prolonged QTC -Optimize electrolytes -Avoid or minimize QT prolonging drugs  ADHD: Does not seem to be on meds at home. -Outpatient follow-up  Pneumonia ruled out.  Discontinue ceftriaxone  Morbid obesity Body mass index is 40.96 kg/m.          DVT prophylaxis:   apixaban (ELIQUIS) tablet 5 mg  Code Status: Full code Family Communication: None at bedside Level of care: Telemetry Cardiac Status is: Inpatient Remains inpatient appropriate because: Acute CHF, AKI   Final disposition: Home Consultants:  Cardiology Psychiatry (signed off)  55 minutes with more than 50% spent in reviewing records, counseling patient/family and coordinating care.   Sch Meds:  Scheduled Meds:  apixaban  5 mg Oral BID   carvedilol  6.25 mg Oral BID WC   dapagliflozin propanediol  10 mg Oral Daily   digoxin  0.125 mg Oral Daily   folic acid  1 mg Oral Daily   furosemide  40 mg Intravenous BID   multivitamin with minerals  1 tablet Oral Daily   nicotine  21 mg Transdermal Daily   sacubitril-valsartan  1 tablet Oral BID   sodium chloride flush  3 mL Intravenous Q12H   [START ON 12/21/2022] spironolactone  25 mg Oral Daily   thiamine  100 mg Oral Daily   Or   thiamine  100 mg Intravenous Daily   Continuous Infusions:  sodium chloride     PRN Meds:.sodium chloride, acetaminophen, albuterol, LORazepam **OR** LORazepam, ondansetron (ZOFRAN) IV, mouth rinse, sodium chloride flush  Antimicrobials: Anti-infectives (From admission, onward)    Start     Dose/Rate Route Frequency Ordered Stop   12/18/22 0900  cefTRIAXone (ROCEPHIN) 2 g in sodium chloride 0.9 % 100 mL IVPB  Status:  Discontinued        2 g 200 mL/hr over 30 Minutes Intravenous Every 24 hours  12/18/22 0807 12/19/22 0807   12/17/22 1815  cefTRIAXone (ROCEPHIN) 2 g in sodium chloride 0.9 % 100 mL IVPB        2 g 200 mL/hr over 30 Minutes Intravenous  Once 12/17/22 1809 12/17/22 2126        I have personally reviewed the following labs and images: CBC: Recent Labs  Lab 12/17/22 1839 12/17/22 1847 12/18/22 0251 12/19/22 0013  WBC 10.7*  --  12.1* 10.0  NEUTROABS 7.8*  --  8.7*  --   HGB 16.8 16.7 16.7 16.4  HCT 50.0 49.0 46.7 47.0  MCV 96.7  --  91.2 93.4  PLT 291  --  244 269   BMP &GFR Recent Labs  Lab 12/17/22 1839 12/17/22 1847 12/17/22 2341 12/18/22 0251 12/18/22 0835 12/19/22 0013 12/20/22 0221  NA 138 136 135  --  137 135 135  K 4.3 4.0 3.8  --  4.0 3.6 3.6  CL 106 109 102  --  104 104 104  CO2 16*  --  22  --  19* 19* 19*  GLUCOSE 80 79 109*  --  124* 102* 111*  BUN 12 13 12   --  13 19 20   CREATININE 1.28* 1.30* 1.36*  --  1.46* 1.22 1.30*  CALCIUM 9.0  --  8.9  --  9.1 9.0 9.0  MG  --   --  2.0 2.2  --  2.1 2.4  PHOS  --   --   --  4.5 4.9* 5.0* 4.9*   Estimated Creatinine Clearance: 118.5 mL/min (A) (by C-G formula based on SCr of 1.3 mg/dL (H)). Liver & Pancreas: Recent Labs  Lab 12/17/22 2341 12/18/22 0835 12/19/22 0013 12/20/22 0221  AST 41  --  38 41  ALT 67*  --  61* 68*  ALKPHOS 51  --  45 39  BILITOT 1.4*  --  0.8 1.1  PROT 6.7  --  6.3* 6.8  ALBUMIN 3.7 3.5 3.5 3.8   No results for input(s): "LIPASE", "AMYLASE" in the last 168 hours. No results for input(s): "AMMONIA" in the last 168 hours. Diabetic: No results for input(s): "HGBA1C" in the last 72 hours. Recent Labs  Lab 12/17/22 1812  GLUCAP 94   Cardiac Enzymes: No results for input(s): "CKTOTAL", "CKMB", "CKMBINDEX", "TROPONINI" in the last 168 hours. No results for input(s): "PROBNP" in the last 8760 hours. Coagulation Profile: Recent Labs  Lab 12/17/22 2033  INR 1.1   Thyroid Function Tests: No results for input(s): "TSH", "T4TOTAL", "FREET4", "T3FREE",  "THYROIDAB" in the last 72 hours. Lipid Profile: No results for input(s): "CHOL", "HDL", "LDLCALC", "TRIG", "CHOLHDL", "LDLDIRECT" in the last 72 hours. Anemia Panel: No results for input(s): "VITAMINB12", "FOLATE", "FERRITIN", "TIBC", "IRON", "RETICCTPCT" in the last 72 hours. Urine analysis:    Component Value Date/Time   COLORURINE YELLOW 12/17/2022 1914   APPEARANCEUR CLEAR 12/17/2022 1914   LABSPEC 1.010 12/17/2022 1914   PHURINE 7.0 12/17/2022 1914   GLUCOSEU NEGATIVE 12/17/2022 1914   HGBUR NEGATIVE 12/17/2022 1914   BILIRUBINUR NEGATIVE 12/17/2022 1914   KETONESUR NEGATIVE 12/17/2022 1914   PROTEINUR NEGATIVE 12/17/2022 1914   NITRITE NEGATIVE 12/17/2022 1914   LEUKOCYTESUR NEGATIVE 12/17/2022 1914   Sepsis Labs: Invalid input(s): "PROCALCITONIN", "LACTICIDVEN"  Microbiology: Recent Results (from the past 240 hour(s))  Blood Culture (routine x 2)     Status: None (Preliminary result)   Collection Time: 12/17/22  6:39 PM   Specimen: BLOOD RIGHT ARM  Result Value Ref Range Status   Specimen Description BLOOD RIGHT ARM  Final   Special Requests   Final    BOTTLES DRAWN AEROBIC AND ANAEROBIC Blood Culture results may not be optimal due to an inadequate volume of blood received in culture bottles   Culture   Final    NO GROWTH 3 DAYS Performed at [facility], [address] [place], [place] [address]    Report Status PENDING  Incomplete  Blood Culture (routine x 2)     Status: None (Preliminary result)   Collection Time: 12/17/22  7:14 PM   Specimen: BLOOD RIGHT HAND  Result Value Ref Range Status   Specimen Description BLOOD RIGHT HAND  Final   Special Requests   Final    BOTTLES DRAWN AEROBIC AND ANAEROBIC Blood Culture adequate volume   Culture   Final    NO GROWTH 3 DAYS Performed at [facility], [address], [place], [place] [address]    Report Status PENDING  Incomplete  Radiology Studies: No results found.  Sascha Baugher T. Ettore Trebilcock [facility]  If 7PM-7AM, please contact night-coverage [email or web address] 12/20/2022, 1:38 PM

## 2022-12-20 NOTE — Discharge Instructions (Addendum)
Information on my medicine - ELIQUIS (apixaban)  Why was Eliquis prescribed for you? Eliquis was prescribed to prevent the formation of blood clots in your heart.  What do You need to know about Eliquis ? The dose is ONE 5 mg tablet taken TWICE daily.  Eliquis may be taken with or without food.   Try to take the dose about the same time in the morning and in the evening. If you have difficulty swallowing the tablet whole please discuss with your pharmacist how to take the medication safely.  Take Eliquis exactly as prescribed and DO NOT stop taking Eliquis without talking to the doctor who prescribed the medication.  Stopping may increase your risk of developing a new blood clot.  Refill your prescription before you run out.  After discharge, you should have regular check-up appointments with your healthcare provider that is prescribing your Eliquis.    What do you do if you miss a dose? If a dose of ELIQUIS is not taken at the scheduled time, take it as soon as possible on the same day and twice-daily administration should be resumed. The dose should not be doubled to make up for a missed dose.  Important Safety Information A possible side effect of Eliquis is bleeding. You should call your healthcare provider right away if you experience any of the following: Bleeding from an injury or your nose that does not stop. Unusual colored urine (red or dark brown) or unusual colored stools (red or black). Unusual bruising for unknown reasons. A serious fall or if you hit your head (even if there is no bleeding).  Some medicines may interact with Eliquis and might increase your risk of bleeding or clotting while on Eliquis. To help avoid this, consult your healthcare provider or pharmacist prior to using any new prescription or non-prescription medications, including herbals, vitamins, non-steroidal anti-inflammatory drugs (NSAIDs) and supplements.  This website has more information on  Eliquis (apixaban): http://www.eliquis.com/eliquis/home

## 2022-12-20 NOTE — TOC Progression Note (Addendum)
Transition of Care [facility]) - Progression Note    Patient Details  Name: Joel [name] MRN: [id number] Date of Birth: [birth year]  [facility] CM/SW Contact  Delilah Shan, LCSWA Phone Number: 12/20/2022, 3:42 PM  Clinical Narrative:     CSW spoke with patient and patients sister Joel [name] at bedside. Patient gave CSW verbal permission to fax over referral to [facility] with [facility]. Joel [name] confirmed once patient accepted to [facility] that family will transport patient there at dc. CSW spoke with Drue Flirt at [facility] and provided CSW with fax number to send referral too [phone]. CSW faxed over patients referral to [facility]. CSW will continue to follow.   Expected Discharge Plan: Home/Self Care Barriers to Discharge: Continued Medical Work up  Expected Discharge Plan and Services   Discharge Planning Services: CM Consult   Living arrangements for the past 2 months: Single Family Home                                       Social Determinants of Health (SDOH) Interventions SDOH Screenings   Food Insecurity: No Food Insecurity (12/17/2022)  Housing: Low Risk  (12/17/2022)  Transportation Needs: No Transportation Needs (12/17/2022)  Utilities: Not At Risk (12/17/2022)  Depression (PHQ2-9): High Risk (01/29/2022)  Tobacco Use: High Risk (12/18/2022)    Readmission Risk Interventions     No data to display

## 2022-12-20 NOTE — TOC Initial Note (Addendum)
Transition of Care [facility]) - Initial/Assessment Note    Patient Details  Name: Joel [name] MRN: [id number] Date of Birth: 04/30/1996  Transition of Care [facility]) CM/SW Contact:    Elliot Cousin, RN Phone Number: [phone] 12/20/2022, 8:39 AM  Clinical Narrative:     HF TOC CM spoke to pt and states his sister is setting up for him to go to [facility] for drug rehab.  Pt states he has a scale at home for daily weights. Pt reports he works and has notified his employer. Did not need a note for work. Benefits check sent for [place].               Expected Discharge Plan: Home/Self Care Barriers to Discharge: Continued Medical Work up   Patient Goals and CMS Choice Patient states their goals for this hospitalization and ongoing recovery are:: wants to drug rehab   Expected Discharge Plan and Services   Discharge Planning Services: CM Consult   Living arrangements for the past 2 months: Single Family Home                  Prior Living Arrangements/Services Living arrangements for the past 2 months: Single Family Home Lives with:: Relatives Patient language and need for interpreter reviewed:: Yes Do you feel safe going back to the place where you live?: Yes      Need for Family Participation in Patient Care: No (Comment) Care giver support system in place?: No (comment)   Criminal Activity/Legal Involvement Pertinent to Current Situation/Hospitalization: No - Comment as needed  Activities of Daily Living Home Assistive Devices/Equipment: None ADL Screening (condition at time of admission) Patient's cognitive ability adequate to safely complete daily activities?: Yes Is the patient deaf or have difficulty hearing?: No Does the patient have difficulty seeing, even when wearing glasses/contacts?: No Does the patient have difficulty concentrating, remembering, or making decisions?: No Patient able to express need for assistance with ADLs?: Yes Does  the patient have difficulty dressing or bathing?: No Independently performs ADLs?: Yes (appropriate for developmental age) Does the patient have difficulty walking or climbing stairs?: No Weakness of Legs: None Weakness of Arms/Hands: None  Permission Sought/Granted Permission sought to share information with : Case Manager, Family Supports, PCP Permission granted to share information with : Yes, Verbal Permission Granted  Share Information with NAME: Joel [name]     Permission granted to share info w Relationship: sister  Permission granted to share info w Contact Information: [phone]  Emotional Assessment Appearance:: Appears stated age Attitude/Demeanor/Rapport: Engaged Affect (typically observed): Accepting Orientation: : Oriented to Self, Oriented to Place, Oriented to  Time, Oriented to Situation Alcohol / Substance Use: Illicit Drugs Psych Involvement: No (comment)  Admission diagnosis:  Acute systolic (congestive) heart failure (HCC) [I50.21] Patient Active Problem List   Diagnosis Date Noted   Shortness of breath 12/17/2022   Heart failure, systolic, chronic (HCC) 12/17/2022   Primary hypertension 12/17/2022   Acute systolic (congestive) heart failure (HCC) 12/17/2022   H/O: CVA (cerebrovascular accident) 12/17/2022   Class 3 severe obesity due to excess calories with body mass index (BMI) of 40.0 to 44.9 in adult (HCC) 01/29/2022   Mixed hyperlipidemia 01/29/2022   CHF (congestive heart failure) (HCC) 06/02/2020   Encounter for therapeutic drug monitoring 05/04/2020   Cerebral embolism with cerebral infarction 04/28/2020   Acute systolic CHF (congestive heart failure) (HCC) 04/24/2020   LV (left ventricular) mural thrombus without MI (HCC) 04/24/2020  Dilated cardiomyopathy (HCC) 04/24/2020   History of COVID-19 04/24/2020   Multifocal pneumonia 04/24/2020   Seasonal allergies 10/26/2019   PCP:  Blane Ohara, MD Pharmacy:   [facility], [place] - [address] [place] [address] Phone: [phone] Fax: [phone]  [facility] [address], [place] - [address] [place] [address] Phone: [phone] Fax: [phone]     Social Determinants of Health (SDOH) Social History: SDOH Screenings   Food Insecurity: No Food Insecurity (12/17/2022)  Housing: Low Risk  (12/17/2022)  Transportation Needs: No Transportation Needs (12/17/2022)  Utilities: Not At Risk (12/17/2022)  Depression (PHQ2-9): High Risk (01/29/2022)  Tobacco Use: High Risk (12/18/2022)   SDOH Interventions:     Readmission Risk Interventions     No data to display

## 2022-12-21 ENCOUNTER — Other Ambulatory Visit (HOSPITAL_COMMUNITY): Payer: Self-pay

## 2022-12-21 DIAGNOSIS — I10 Essential (primary) hypertension: Secondary | ICD-10-CM | POA: Diagnosis not present

## 2022-12-21 DIAGNOSIS — Z8673 Personal history of transient ischemic attack (TIA), and cerebral infarction without residual deficits: Secondary | ICD-10-CM | POA: Diagnosis not present

## 2022-12-21 DIAGNOSIS — I5021 Acute systolic (congestive) heart failure: Secondary | ICD-10-CM | POA: Diagnosis not present

## 2022-12-21 LAB — CULTURE, BLOOD (ROUTINE X 2): Culture: NO GROWTH

## 2022-12-21 LAB — CBC
HCT: 52.9 % — ABNORMAL HIGH (ref 39.0–52.0)
Hemoglobin: 18.2 g/dL — ABNORMAL HIGH (ref 13.0–17.0)
MCH: 31.8 pg (ref 26.0–34.0)
MCHC: 34.4 g/dL (ref 30.0–36.0)
MCV: 92.5 fL (ref 80.0–100.0)
Platelets: 315 10*3/uL (ref 150–400)
RBC: 5.72 MIL/uL (ref 4.22–5.81)
RDW: 16.3 % — ABNORMAL HIGH (ref 11.5–15.5)
WBC: 12.7 10*3/uL — ABNORMAL HIGH (ref 4.0–10.5)
nRBC: 0 % (ref 0.0–0.2)

## 2022-12-21 LAB — RENAL FUNCTION PANEL
Albumin: 3.8 g/dL (ref 3.5–5.0)
Anion gap: 12 (ref 5–15)
BUN: 23 mg/dL — ABNORMAL HIGH (ref 6–20)
CO2: 17 mmol/L — ABNORMAL LOW (ref 22–32)
Calcium: 9.2 mg/dL (ref 8.9–10.3)
Chloride: 106 mmol/L (ref 98–111)
Creatinine, Ser: 1.3 mg/dL — ABNORMAL HIGH (ref 0.61–1.24)
GFR, Estimated: >60 mL/min (ref >60)
Glucose, Bld: 114 mg/dL — ABNORMAL HIGH (ref 70–99)
Phosphorus: 4 mg/dL (ref 2.5–4.6)
Potassium: 3.9 mmol/L (ref 3.5–5.1)
Sodium: 135 mmol/L (ref 135–145)

## 2022-12-21 LAB — MAGNESIUM: Magnesium: 2.6 mg/dL — ABNORMAL HIGH (ref 1.7–2.4)

## 2022-12-21 MED ORDER — POTASSIUM CHLORIDE CRYS ER 20 MEQ PO TBCR
20.0000 meq | EXTENDED_RELEASE_TABLET | Freq: Once | ORAL | Status: AC
  Administered 2022-12-21: 20 meq via ORAL
  Filled 2022-12-21: qty 1

## 2022-12-21 MED ORDER — TORSEMIDE 20 MG PO TABS
40.0000 mg | ORAL_TABLET | Freq: Every day | ORAL | 2 refills | Status: DC
  Filled 2022-12-21: qty 60, 30d supply, fill #0

## 2022-12-21 MED ORDER — SPIRONOLACTONE 25 MG PO TABS
25.0000 mg | ORAL_TABLET | Freq: Every day | ORAL | 2 refills | Status: DC
  Filled 2022-12-21: qty 30, 30d supply, fill #0

## 2022-12-21 MED ORDER — TORSEMIDE 20 MG PO TABS
40.0000 mg | ORAL_TABLET | Freq: Every day | ORAL | Status: DC
  Administered 2022-12-21: 40 mg via ORAL
  Filled 2022-12-21: qty 2

## 2022-12-21 MED ORDER — THIAMINE HCL 100 MG PO TABS
100.0000 mg | ORAL_TABLET | Freq: Every day | ORAL | 2 refills | Status: DC
  Filled 2022-12-21: qty 30, 30d supply, fill #0

## 2022-12-21 MED ORDER — ADULT MULTIVITAMIN W/MINERALS CH: 1.0000 | ORAL_TABLET | Freq: Every day | ORAL | Status: DC

## 2022-12-21 MED ORDER — DAPAGLIFLOZIN PROPANEDIOL 10 MG PO TABS
10.0000 mg | ORAL_TABLET | Freq: Every day | ORAL | 2 refills | Status: AC
  Filled 2022-12-21: qty 30, 30d supply, fill #0
  Filled 2023-03-10: qty 30, 30d supply, fill #1

## 2022-12-21 MED ORDER — SACUBITRIL-VALSARTAN 49-51 MG PO TABS
1.0000 | ORAL_TABLET | Freq: Two times a day (BID) | ORAL | 2 refills | Status: DC
  Filled 2022-12-21: qty 60, 30d supply, fill #0

## 2022-12-21 MED ORDER — POTASSIUM CHLORIDE CRYS ER 20 MEQ PO TBCR
20.0000 meq | EXTENDED_RELEASE_TABLET | Freq: Every day | ORAL | 2 refills | Status: DC
  Filled 2022-12-21: qty 30, 30d supply, fill #0

## 2022-12-21 MED ORDER — APIXABAN 5 MG PO TABS
5.0000 mg | ORAL_TABLET | Freq: Two times a day (BID) | ORAL | 2 refills | Status: DC
  Filled 2022-12-21: qty 60, 30d supply, fill #0

## 2022-12-21 MED ORDER — DIGOXIN 125 MCG PO TABS
0.1250 mg | ORAL_TABLET | Freq: Every day | ORAL | 2 refills | Status: AC
  Filled 2022-12-21: qty 30, 30d supply, fill #0
  Filled 2023-03-10: qty 30, 30d supply, fill #1

## 2022-12-21 MED ORDER — NICOTINE 21 MG/24HR TD PT24: 21.0000 mg | MEDICATED_PATCH | Freq: Every day | TRANSDERMAL | 0 refills | Status: DC

## 2022-12-21 MED ORDER — FOLIC ACID 1 MG PO TABS
1.0000 mg | ORAL_TABLET | Freq: Every day | ORAL | 2 refills | Status: DC
  Filled 2022-12-21: qty 30, 30d supply, fill #0

## 2022-12-21 MED ORDER — CARVEDILOL 6.25 MG PO TABS
6.2500 mg | ORAL_TABLET | Freq: Two times a day (BID) | ORAL | 2 refills | Status: DC
  Filled 2022-12-21: qty 60, 30d supply, fill #0

## 2022-12-21 NOTE — Discharge Summary (Signed)
Physician Discharge Summary  Joel [name]:[id number] DOB: [birth year] DOA: 12/17/2022  PCP: Blane Ohara, MD  Admit date: 12/17/2022 Discharge date: 12/21/2022 Admitted From: Home Disposition: Home Recommendations for Outpatient Follow-up:  Follow up with PCP in 1 week Cardiology to arrange outpatient follow-up Check fluid status, BP, renal functions and electrolytes at follow-up Continue encouraging tobacco, substance use and alcohol cessation Continue encouraging compliance with his medication Please follow up on the following pending results: None  Home Health: Not indicated Equipment/Devices: Not indicated  Discharge Condition: Stable CODE STATUS: Full code  Follow-up Information     CoxFritzi Mandes, MD Follow up.   Specialty: Family Medicine Why: please call to schedule an appt in 1-2 weeks for hospital follow up Contact information: [address] [place] [address] [phone]         [facility] Follow up on 01/02/2023.   Specialty: Cardiology Why: Advanced Heart Failure Clinic 9:30 AM Entrance C, Free Valet Parking Please bring all medications to appointment Contact information: [address] [id number] mc [place] [address] [phone]                Hospital course 27 year old M with PMH of COVID-related DCM, LV thrombus, CVA, polysubstance use including meth, cocaine, marijuana, alcohol and tobacco and noncompliance presenting with progressive shortness of breath for 1 months and orthopnea for about a week, and admitted for acute on chronic systolic CHF and AKI.  BNP elevated to 510.  CXR with mild cardiomegaly, asymmetric edema versus infection.  Troponin elevated 67>> 89.  Lactic acid 6.5 but quickly resolved.  To he was started on IV Lasix and IV ceftriaxone.  Cardiology consulted and TTE ordered.   The next day, TTE with LVEF of 15%, GH, indeterminate DD,  severe LAE and normal RVSF.  Symptoms improved with IV Lasix.  Evaluated by cardiology.  Started on GDMT in addition to diuretics.  Intake and output was difficult to monitor but net -6.6 L.  Weight down from 303 to 285 pounds.  Continued to do well.  Cleared for discharge on diuretics and GDMT as below per recommendation by advanced heart failure team.  Counseled on the importance of compliance with his medication.  Patient does ambulatory without respiratory distress or oxygen requirement.   In regards to polysubstance use including alcohol, tobacco, cocaine, marijuana and methamphetamine, counseled on provided with resources.  Planning to go to [facility] for assistance to stay sober.   See individual problem list below for more.   Problems addressed during this hospitalization Principal Problem:   Acute systolic (congestive) heart failure (HCC) Active Problems:   Dilated cardiomyopathy (HCC)   Class 3 severe obesity due to excess calories with body mass index (BMI) of 40.0 to 44.9 in adult [facility])   Mixed hyperlipidemia   Primary hypertension   H/O: CVA (cerebrovascular accident)   Acute on chronic systolic CHF/history of DCM: TTE with LVEF of 15%, GH, severe LAE and normal RVSP.  Presented with progressive dyspnea and orthopnea in the setting of noncompliance with meds and ongoing use of polysubstance.  Symptoms improved with IV Lasix and transition to p.o. diuretics.  Net -6.6 L.  Hemodynamically stable. -Cleared for discharge by cardiology/advanced heart failure team on the following medications. -Torsemide 40 mg daily  -K Dur 20 meq daily  -Digoxin 0.125 mg daily  -Coreg 6.25 mg bid.  -Entresto 49-51 mg bid -Spiro to 25 mg daily  -Farxiga 10  mg daily  -Eliquis 5 mg twice a day.  -Advanced heart failure team to arrange outpatient follow-up -Consult on the importance of compliance and substance abuse cessation   History of LV thrombus/cardioembolic CVA: Does not seem to be on  anticoagulation.  No report of thrombus on current TTE. -Started on Eliquis by cardiology   Uncontrolled hypertension: Likely due to noncompliance and polysubstance use. -Cardiac meds as above   XBJ:YNWGN be due to polysubstance use and CHF. Not on nephrotoxic meds at home.   -Reassess renal function at follow-up   Alcohol use disorder: No withdrawal symptoms.  Reports drinking 12 to 15 g/coke on the weekends. -Counseled on cessation -Provided with resources. -[facility]   Polysubstance use: UDS positive for amphetamine and marijuana.  Also admits to using cocaine, alcohol and tobacco. -Encouraged cessation -Nicotine patch -Counseled.   Elevated troponin: Likely demand ischemia due to CHF and AKI.  Doubt ACS.  TTE as above. -Defer to cardiology   Lactic acidosis: Resolved quickly.  Could be related to alcohol use   Prolonged QTC -Minimize or avoid QT prolonging drugs   ADHD: Does not seem to be on meds at home. -Outpatient follow-up   Pneumonia ruled out.  Discontinue ceftriaxone   Morbid obesity Body mass index is 40.98 kg/m.             Time spent 45 minutes  Vital signs Vitals:   12/20/22 1959 12/21/22 0458 12/21/22 0500 12/21/22 0959  BP: (!) 127/90 108/77  132/84  Pulse: (!) 101 95  (!) 101  Temp: 97.6 F (36.4 C) 98.1 F (36.7 C)    Resp: 20 18    Height:      Weight:   129.5 kg   SpO2: 100% 94%    TempSrc: Oral Oral    BMI (Calculated):   40.98      Discharge exam  GENERAL: No apparent distress.  Nontoxic. HEENT: MMM.  Vision and hearing grossly intact.  NECK: Supple.  No apparent JVD.  RESP:  No IWOB.  Fair aeration bilaterally. CVS:  RRR. Heart sounds normal.  ABD/GI/GU: BS+. Abd soft, NTND.  MSK/EXT:  Moves extremities. No apparent deformity. No edema.  SKIN: no apparent skin lesion or wound NEURO: Awake and alert. Oriented appropriately.  No apparent focal neuro deficit. PSYCH: Calm. Normal affect.   Discharge  Instructions Discharge Instructions     (HEART FAILURE PATIENTS) Call MD:  Anytime you have any of the following symptoms: 1) 3 pound weight gain in 24 hours or 5 pounds in 1 week 2) shortness of breath, with or without a dry hacking cough 3) swelling in the hands, feet or stomach 4) if you have to sleep on extra pillows at night in order to breathe.   Complete by: As directed    Call MD for:  difficulty breathing, headache or visual disturbances   Complete by: As directed    Call MD for:  extreme fatigue   Complete by: As directed    Call MD for:  persistant dizziness or light-headedness   Complete by: As directed    Diet - low sodium heart healthy   Complete by: As directed    Discharge instructions   Complete by: As directed    It has been a pleasure taking care of you!  You were hospitalized due to heart failure for which you have been treated medically.  It is very important that you take your medications as prescribed.  We  also recommend refraining from alcohol use, smoking cigarette and substance use.  Please follow-up with your primary care doctor in 1 to 2 weeks or sooner if needed.  Follow-up with cardiology/heart failure clinic per their recommendation.  In addition to taking your medications as prescribed, we also recommend you avoid alcohol or over-the-counter pain medication other than plain Tylenol, limit the amount of water/fluid you drink to less than 6 cups (1500 cc) a day,  limit your sodium (salt) intake to less than 2 g (2000 mg) a day and weigh yourself daily at the same time and keeping your weight log.     Take care,   Increase activity slowly   Complete by: As directed       Allergies as of 12/21/2022   No Known Allergies      Medication List     TAKE these medications    albuterol (2.5 MG/3ML) 0.083% nebulizer solution Commonly known as: PROVENTIL Take 2.5 mg by nebulization every 6 (six) hours as needed for wheezing or shortness of breath.    apixaban 5 MG Tabs tablet Commonly known as: ELIQUIS Take 1 tablet (5 mg total) by mouth 2 (two) times daily.   carvedilol 6.25 MG tablet Commonly known as: COREG Take 1 tablet (6.25 mg total) by mouth 2 (two) times daily with a meal.   dapagliflozin propanediol 10 MG Tabs tablet Commonly known as: FARXIGA Take 1 tablet (10 mg total) by mouth daily. Start taking on: December 22, 2022   digoxin 0.125 MG tablet Commonly known as: LANOXIN Take 1 tablet (0.125 mg total) by mouth daily. Start taking on: December 22, 2022   folic acid 1 MG tablet Commonly known as: FOLVITE Take 1 tablet (1 mg total) by mouth daily. Start taking on: December 22, 2022   hydrOXYzine 50 MG tablet Commonly known as: ATARAX Take 100 mg by mouth daily as needed for anxiety or itching.   multivitamin with minerals Tabs tablet Take 1 tablet by mouth daily. Start taking on: December 22, 2022   nicotine 21 mg/24hr patch Commonly known as: NICODERM CQ - dosed in mg/24 hours Place 1 patch (21 mg total) onto the skin daily. Start taking on: December 22, 2022   potassium chloride SA 20 MEQ tablet Commonly known as: KLOR-CON M Take 1 tablet (20 mEq total) by mouth daily.   sacubitril-valsartan 49-51 MG Commonly known as: ENTRESTO Take 1 tablet by mouth 2 (two) times daily.   spironolactone 25 MG tablet Commonly known as: ALDACTONE Take 1 tablet (25 mg total) by mouth daily. Start taking on: December 22, 2022   thiamine 100 MG tablet Commonly known as: Vitamin B-1 Take 1 tablet (100 mg total) by mouth daily. Start taking on: December 22, 2022   Torsemide 40 MG Tabs Take 40 mg by mouth daily. Start taking on: December 22, 2022        Consultations: Cardiology/advanced heart failure team  Procedures/Studies: None   ECHOCARDIOGRAM COMPLETE  Result Date: 12/18/2022    ECHOCARDIOGRAM REPORT   Patient Name:   KAVAUGHN FAUCETT [facility] Date of Exam: 12/18/2022 Medical Rec #:  [id number]           Height:       70.0 in Accession  #:    [id number]          Weight:       294.0 lb Date of Birth:  01-14-1996           BSA:  2.459 m Patient Age:    26 years            BP:           164/122 mmHg Patient Gender: M                   HR:           108 bpm. Exam Location:  Inpatient Procedure: 2D Echo, Cardiac Doppler, Color Doppler and Intracardiac            Opacification Agent Indications:    CHF  History:        Patient has prior history of Echocardiogram examinations, most                 recent 03/07/2022. Cardiomyopathy and CHF; Risk                 Factors:Hypertension.  Sonographer:    Darlys Gales Referring Phys: [phone] DEBBY CROSLEY IMPRESSIONS  1. There is no left ventricular thrombus (Definity contrast was used). Left ventricular ejection fraction, by estimation, is 15%. The left ventricle has severely decreased function. The left ventricle demonstrates global hypokinesis. The left ventricular internal cavity size was severely dilated. There is mild eccentric left ventricular hypertrophy. Indeterminate diastolic filling due to E-A fusion.  2. Right ventricular systolic function is normal. The right ventricular size is normal. Tricuspid regurgitation signal is inadequate for assessing PA pressure.  3. Left atrial size was severely dilated.  4. A small pericardial effusion is present.  5. The mitral valve is normal in structure. Mild mitral valve regurgitation.  6. The aortic valve is tricuspid. Aortic valve regurgitation is not visualized. No aortic stenosis is present. Comparison(s): Prior images reviewed side by side. The left ventricular function is significantly worse. The left ventricle is now severely dilated. FINDINGS  Left Ventricle: There is no left ventricular thrombus (Definity contrast was used). Left ventricular ejection fraction, by estimation, is 15%. The left ventricle has severely decreased function. The left ventricle demonstrates global hypokinesis. Definity contrast agent was given IV to delineate the left  ventricular endocardial borders. The left ventricular internal cavity size was severely dilated. There is mild eccentric left ventricular hypertrophy. Indeterminate diastolic filling due to E-A fusion. Right Ventricle: The right ventricular size is normal. No increase in right ventricular wall thickness. Right ventricular systolic function is normal. Tricuspid regurgitation signal is inadequate for assessing PA pressure. Left Atrium: Left atrial size was severely dilated. Right Atrium: Right atrial size was normal in size. Pericardium: A small pericardial effusion is present. Mitral Valve: The mitral valve is normal in structure. Mild mitral valve regurgitation, with centrally-directed jet. Tricuspid Valve: The tricuspid valve is normal in structure. Tricuspid valve regurgitation is not demonstrated. Aortic Valve: The aortic valve is tricuspid. Aortic valve regurgitation is not visualized. No aortic stenosis is present. Aortic valve mean gradient measures 2.0 mmHg. Aortic valve peak gradient measures 3.1 mmHg. Aortic valve area, by VTI measures 3.55 cm. Pulmonic Valve: The pulmonic valve was not well visualized. Pulmonic valve regurgitation is not visualized. No evidence of pulmonic stenosis. Aorta: The aortic root and ascending aorta are structurally normal, with no evidence of dilitation. Venous: The inferior vena cava was not well visualized. IAS/Shunts: No atrial level shunt detected by color flow Doppler.  LEFT VENTRICLE PLAX 2D LVIDd:         7.40 cm   Diastology LVIDs:         6.20 cm   LV e' medial:  7.29 cm/s LV PW:         1.30 cm   LV E/e' medial:  13.9 LV IVS:        1.30 cm   LV e' lateral:   9.90 cm/s LVOT diam:     2.50 cm   LV E/e' lateral: 10.2 LV SV:         42 LV SV Index:   17 LVOT Area:     4.91 cm  LEFT ATRIUM              Index        RIGHT ATRIUM           Index LA Vol (A2C):   53.6 ml  21.80 ml/m  RA Area:     11.10 cm LA Vol (A4C):   105.0 ml 42.71 ml/m  RA Volume:   23.70 ml  9.64  ml/m LA Biplane Vol: 77.0 ml  31.32 ml/m  AORTIC VALVE AV Area (Vmax):    3.66 cm AV Area (Vmean):   3.30 cm AV Area (VTI):     3.55 cm AV Vmax:           87.50 cm/s AV Vmean:          66.700 cm/s AV VTI:            0.119 m AV Peak Grad:      3.1 mmHg AV Mean Grad:      2.0 mmHg LVOT Vmax:         65.30 cm/s LVOT Vmean:        44.800 cm/s LVOT VTI:          0.086 m LVOT/AV VTI ratio: 0.72  AORTA Ao Root diam: 3.80 cm Ao Asc diam:  3.70 cm MITRAL VALVE MV Area (PHT): 6.88 cm     SHUNTS MV E velocity: 101.00 cm/s  Systemic VTI:  0.09 m                             Systemic Diam: 2.50 cm Rachelle Hora Croitoru MD Electronically signed by Thurmon Fair MD Signature Date/Time: 12/18/2022/1:44:46 PM    Final    CT Angio Chest PE W and/or Wo Contrast  Result Date: 12/17/2022 CLINICAL DATA:  Tachycardia, hemoptysis, tachypnea, pleuritic chest pain EXAM: CT ANGIOGRAPHY CHEST WITH CONTRAST TECHNIQUE: Multidetector CT imaging of the chest was performed using the standard protocol during bolus administration of intravenous contrast. Multiplanar CT image reconstructions and MIPs were obtained to evaluate the vascular anatomy. RADIATION DOSE REDUCTION: This exam was performed according to the departmental dose-optimization program which includes automated exposure control, adjustment of the mA and/or kV according to patient size and/or use of iterative reconstruction technique. CONTRAST:  75mL OMNIPAQUE IOHEXOL 350 MG/ML SOLN COMPARISON:  04/23/2020 FINDINGS: Cardiovascular: SVC patent. Mild cardiomegaly predominantly left-sided. No pericardial effusion. The RV is nondilated. Satisfactory opacification of pulmonary arteries noted, and there is no evidence of pulmonary emboli. Incomplete opacification of the thoracic aorta, without aneurysm. Mediastinum/Nodes: No mass. 1.1 cm AP window lymph node stable since previous. No hilar adenopathy. Lungs/Pleura: Trace left pleural effusion. No pneumothorax. Extensive ground-glass  opacities throughout both lower lobes and posterior upper lobes right greater than left, new since previous. Upper Abdomen: No acute findings. Musculoskeletal: No chest wall abnormality. No acute or significant osseous findings. Review of the MIP images confirms the above findings. IMPRESSION: 1. Negative for acute PE or thoracic aortic dissection. 2. Extensive  bilateral ground-glass opacities, right greater than left, with trace left pleural effusion. Considerations include asymmetric edema versus infectious/inflammatory process. 3. Mild cardiomegaly. Electronically Signed   By: Corlis Leak M.D.   On: 12/17/2022 20:22   DG Chest 2 View  Result Date: 12/17/2022 CLINICAL DATA:  Shortness of breath for a month EXAM: CHEST - 2 VIEW COMPARISON:  X-ray 04/24/2020 FINDINGS: Underinflation with bronchovascular crowding. Mild peribronchial thickening. No consolidation, pneumothorax, effusion or edema. Overlapping cardiac leads. Normal cardiopericardial silhouette. IMPRESSION: Underinflation.  Mild peribronchial thickening. Electronically Signed   By: Karen Kays M.D.   On: 12/17/2022 18:22       The results of significant diagnostics from this hospitalization (including imaging, microbiology, ancillary and laboratory) are listed below for reference.     Microbiology: Recent Results (from the past 240 hour(s))  Blood Culture (routine x 2)     Status: None (Preliminary result)   Collection Time: 12/17/22  6:39 PM   Specimen: BLOOD RIGHT ARM  Result Value Ref Range Status   Specimen Description BLOOD RIGHT ARM  Final   Special Requests   Final    BOTTLES DRAWN AEROBIC AND ANAEROBIC Blood Culture results may not be optimal due to an inadequate volume of blood received in culture bottles   Culture   Final    NO GROWTH 4 DAYS Performed at [facility], [address], [place], [place] [address]    Report Status PENDING  Incomplete  Blood Culture (routine x 2)     Status: None (Preliminary result)    Collection Time: 12/17/22  7:14 PM   Specimen: BLOOD RIGHT HAND  Result Value Ref Range Status   Specimen Description BLOOD RIGHT HAND  Final   Special Requests   Final    BOTTLES DRAWN AEROBIC AND ANAEROBIC Blood Culture adequate volume   Culture   Final    NO GROWTH 4 DAYS Performed at [facility], [address] [place], [place] [address]    Report Status PENDING  Incomplete     Labs:  CBC: Recent Labs  Lab 12/17/22 1839 12/17/22 1847 12/18/22 0251 12/19/22 0013 12/21/22 0241  WBC 10.7*  --  12.1* 10.0 12.7*  NEUTROABS 7.8*  --  8.7*  --   --   HGB 16.8 16.7 16.7 16.4 18.2*  HCT 50.0 49.0 46.7 47.0 52.9*  MCV 96.7  --  91.2 93.4 92.5  PLT 291  --  244 269 315   BMP &GFR Recent Labs  Lab 12/17/22 2341 12/18/22 0251 12/18/22 0835 12/19/22 0013 12/20/22 0221 12/21/22 0241  NA 135  --  137 135 135 135  K 3.8  --  4.0 3.6 3.6 3.9  CL 102  --  104 104 104 106  CO2 22  --  19* 19* 19* 17*  GLUCOSE 109*  --  124* 102* 111* 114*  BUN 12  --  13 19 20  23*  CREATININE 1.36*  --  1.46* 1.22 1.30* 1.30*  CALCIUM 8.9  --  9.1 9.0 9.0 9.2  MG 2.0 2.2  --  2.1 2.4 2.6*  PHOS  --  4.5 4.9* 5.0* 4.9* 4.0   Estimated Creatinine Clearance: 118.5 mL/min (A) (by C-G formula based on SCr of 1.3 mg/dL (H)). Liver & Pancreas: Recent Labs  Lab 12/17/22 2341 12/18/22 0835 12/19/22 0013 12/20/22 0221 12/21/22 0241  AST 41  --  38 41  --   ALT 67*  --  61* 68*  --  ALKPHOS 51  --  45 39  --   BILITOT 1.4*  --  0.8 1.1  --   PROT 6.7  --  6.3* 6.8  --   ALBUMIN 3.7 3.5 3.5 3.8 3.8   No results for input(s): "LIPASE", "AMYLASE" in the last 168 hours. No results for input(s): "AMMONIA" in the last 168 hours. Diabetic: No results for input(s): "HGBA1C" in the last 72 hours. Recent Labs  Lab 12/17/22 1812  GLUCAP 94   Cardiac Enzymes: No results for input(s): "CKTOTAL", "CKMB", "CKMBINDEX", "TROPONINI" in the last 168 hours. No results for input(s): "PROBNP"  in the last 8760 hours. Coagulation Profile: Recent Labs  Lab 12/17/22 2033  INR 1.1   Thyroid Function Tests: No results for input(s): "TSH", "T4TOTAL", "FREET4", "T3FREE", "THYROIDAB" in the last 72 hours. Lipid Profile: No results for input(s): "CHOL", "HDL", "LDLCALC", "TRIG", "CHOLHDL", "LDLDIRECT" in the last 72 hours. Anemia Panel: No results for input(s): "VITAMINB12", "FOLATE", "FERRITIN", "TIBC", "IRON", "RETICCTPCT" in the last 72 hours. Urine analysis:    Component Value Date/Time   COLORURINE YELLOW 12/17/2022 1914   APPEARANCEUR CLEAR 12/17/2022 1914   LABSPEC 1.010 12/17/2022 1914   PHURINE 7.0 12/17/2022 1914   GLUCOSEU NEGATIVE 12/17/2022 1914   HGBUR NEGATIVE 12/17/2022 1914   BILIRUBINUR NEGATIVE 12/17/2022 1914   KETONESUR NEGATIVE 12/17/2022 1914   PROTEINUR NEGATIVE 12/17/2022 1914   NITRITE NEGATIVE 12/17/2022 1914   LEUKOCYTESUR NEGATIVE 12/17/2022 1914   Sepsis Labs: Invalid input(s): "PROCALCITONIN", "LACTICIDVEN"   SIGNED:  Almon Hercules, MD  [facility] 12/21/2022, 11:07 AM

## 2022-12-21 NOTE — TOC Progression Note (Signed)
Transition of Care [facility]) - Progression Note    Patient Details  Name: Joel [name] MRN: [id number] Date of Birth: June 05, 1996  Transition of Care [facility] CM/SW Contact  Elliot Cousin, RN Phone Number: [phone] 12/21/2022, 3:56 PM  Clinical Narrative:  Per Unit RN, when she called to give report to [facility] they could not accept pt due to complex medical conditions. Sister is going to try another facility for drug rehab. Explained we can submit dc summary.     Expected Discharge Plan: Home/Self Care Barriers to Discharge: No Barriers Identified  Expected Discharge Plan and Services   Discharge Planning Services: CM Consult   Living arrangements for the past 2 months: Single Family Home Expected Discharge Date: 12/21/22                                     Social Determinants of Health (SDOH) Interventions SDOH Screenings   Food Insecurity: No Food Insecurity (12/17/2022)  Housing: Low Risk  (12/17/2022)  Transportation Needs: No Transportation Needs (12/17/2022)  Utilities: Not At Risk (12/17/2022)  Depression (PHQ2-9): High Risk (01/29/2022)  Tobacco Use: High Risk (12/18/2022)    Readmission Risk Interventions     No data to display

## 2022-12-21 NOTE — Progress Notes (Signed)
Advanced Heart Failure Rounding Note  PCP-Cardiologist: None   Subjective:    Denies SOB. Slept well.  Objective:   Weight Range: 129.5 kg Body mass index is 40.98 kg/m.   Vital Signs:   Temp:  [97.6 F (36.4 C)-98.1 F (36.7 C)] 98.1 F (36.7 C) (06/21 0458) Pulse Rate:  [95-101] 95 (06/21 0458) Resp:  [18-20] 18 (06/21 0458) BP: (108-127)/(77-92) 108/77 (06/21 0458) SpO2:  [94 %-100 %] 94 % (06/21 0458) Weight:  [129.5 kg] 129.5 kg (06/21 0500) Last BM Date : 12/18/22  Weight change: Filed Weights   12/19/22 0533 12/20/22 0443 12/21/22 0500  Weight: 131.5 kg 129.5 kg 129.5 kg    Intake/Output:   Intake/Output Summary (Last 24 hours) at 12/21/2022 0918 Last data filed at 12/20/2022 2100 Gross per 24 hour  Intake 240 ml  Output 325 ml  Net -85 ml      Physical Exam   General:  Well appearing. No resp difficulty HEENT: normal Neck: supple. no JVD. Carotids 2+ bilat; no bruits. No lymphadenopathy or thryomegaly appreciated. Cor: PMI nondisplaced. Tachy Regular rate & rhythm. No rubs, gallops or murmurs. Lungs: clear Abdomen: soft, nontender, nondistended. No hepatosplenomegaly. No bruits or masses. Good bowel sounds. Extremities: no cyanosis, clubbing, rash, edema Neuro: alert & orientedx3, cranial nerves grossly intact. moves all 4 extremities w/o difficulty. Affect pleasant   Telemetry   SR 100s  EKG    N/A  Labs    CBC Recent Labs    12/19/22 0013 12/21/22 0241  WBC 10.0 12.7*  HGB 16.4 18.2*  HCT 47.0 52.9*  MCV 93.4 92.5  PLT 269 315   Basic Metabolic Panel Recent Labs    16/10/96 0221 12/21/22 0241  NA 135 135  K 3.6 3.9  CL 104 106  CO2 19* 17*  GLUCOSE 111* 114*  BUN 20 23*  CREATININE 1.30* 1.30*  CALCIUM 9.0 9.2  MG 2.4 2.6*  PHOS 4.9* 4.0   Liver Function Tests Recent Labs    12/19/22 0013 12/20/22 0221 12/21/22 0241  AST 38 41  --   ALT 61* 68*  --   ALKPHOS 45 39  --   BILITOT 0.8 1.1  --   PROT 6.3*  6.8  --   ALBUMIN 3.5 3.8 3.8   No results for input(s): "LIPASE", "AMYLASE" in the last 72 hours. Cardiac Enzymes No results for input(s): "CKTOTAL", "CKMB", "CKMBINDEX", "TROPONINI" in the last 72 hours.  BNP: BNP (last 3 results) Recent Labs    03/07/22 1120 12/17/22 1839  BNP 13.9 510.8*    ProBNP (last 3 results) No results for input(s): "PROBNP" in the last 8760 hours.   D-Dimer No results for input(s): "DDIMER" in the last 72 hours. Hemoglobin A1C No results for input(s): "HGBA1C" in the last 72 hours. Fasting Lipid Panel No results for input(s): "CHOL", "HDL", "LDLCALC", "TRIG", "CHOLHDL", "LDLDIRECT" in the last 72 hours. Thyroid Function Tests No results for input(s): "TSH", "T4TOTAL", "T3FREE", "THYROIDAB" in the last 72 hours.  Invalid input(s): "FREET3"  Other results:   Imaging    No results found.   Medications:     Scheduled Medications:  apixaban  5 mg Oral BID   carvedilol  6.25 mg Oral BID WC   dapagliflozin propanediol  10 mg Oral Daily   digoxin  0.125 mg Oral Daily   folic acid  1 mg Oral Daily   multivitamin with minerals  1 tablet Oral Daily   nicotine  21 mg Transdermal  Daily   sacubitril-valsartan  1 tablet Oral BID   sodium chloride flush  3 mL Intravenous Q12H   spironolactone  25 mg Oral Daily   thiamine  100 mg Oral Daily   Or   thiamine  100 mg Intravenous Daily   torsemide  40 mg Oral Daily    Infusions:  sodium chloride      PRN Medications: sodium chloride, acetaminophen, albuterol, ondansetron (ZOFRAN) IV, mouth rinse, sodium chloride flush    Patient Profile   27 y/o male w/ chronic systolic heart failure due to NICM, suspected viral myocarditis +/- drug induced, h/o LV thrombus, prior cardio embolic CVA, HTN and polysubstance abuse, admitted w/ acute on chronic systolic heart failure in setting of med noncompliance and ongoing substance use.   Assessment/Plan   1. Acute on Chronic Systolic Heart Failure -  NICM. Diagnosed post COVID infection in 2021 - Echo 10/21 EF < 20% + LV thrombus. CMRI with LVEF 15%, RVEF 19%, LGE pattern concerning for myocarditis. Suspect most likely etiology for cardiomyopathy is viral myocarditis due to COVID-19 though methamphetamine use may have contributed as well.  - Echo in 3/22 showed improved EF to 35%.  - Echo 9/22 showed EF up to 45%.  - lost to f/u. Stopped all GDMT + ongoing substance use. Now admitted w/ a/c CHF. Admission labs concerning for low output. LA 6.5>>1.2, CO2 16>>19. Echo w/ severely reduced LVEF 15%, RV normal.  - Volume status stable. Stop IV lasix. Start torsemide 40 mg daily  - Continue digoxin 0.125 mg daily  - Continue Coreg 6.25 mg bid. Avoid further up titration in setting of acute exacerbation with concern for low-output - Continue Entresto 49-51 mg bid - Continue Spiro to 25 mg daily  - Continue Farxiga 10 mg daily  - Would hold off on cath.  - reiterated importance of discontinuation of substance use (UDS + for amphetamines) - not a candidate for advanced therapies given noncompliance and substance use    2. Hypertension  -Improved.  - GDMT titration per above  3. AKI - prior b/l SCr 0.8-1.0 in 2023 -Creatinine 1.3 today.   - suspect cardiorenal  - diuresis + GDMT per above    4. Polysubstance Use/ Stimulant Use Disorder - UDS + for amphetamines and cannabis - psych following  - imperative to quit  - TOC consulted for substance abuse resources  - His sister has been assisting him with setting up [facility] for drug rehab after d/c   5. H/o LV Thrombus w/ Cardioembolic CVA - occurred in 2021 - Echo this admit w/ severely dilated LV, severely reduced systolic fx. No LV thrombus w/ definity contrast  -  D/t concern for compliance will use Eliquis 5 mg BID over warfarin.  HF MEDs for d/c Torsemide 40 mg daily  K Dur 20 meq daily  Digoxin 0.125 mg daily  - Coreg 6.25 mg bid.  - Entresto 49-51 mg bid - Spiro to 25  mg daily  - Farxiga 10 mg daily  - Eliquis 5 mg twice a day.   HF follow up has been set up.   Length of Stay: 4  Nakeyia Menden, NP  12/21/2022, 9:18 AM  Advanced Heart Failure Team Pager [phone] (M-F; 7a - 5p)  Please contact [facility] for night-coverage after hours (5p -7a ) and weekends on [email or web address]

## 2022-12-22 DIAGNOSIS — F1510 Other stimulant abuse, uncomplicated: Secondary | ICD-10-CM | POA: Diagnosis not present

## 2022-12-22 DIAGNOSIS — F1520 Other stimulant dependence, uncomplicated: Secondary | ICD-10-CM | POA: Diagnosis not present

## 2022-12-22 DIAGNOSIS — F17200 Nicotine dependence, unspecified, uncomplicated: Secondary | ICD-10-CM | POA: Diagnosis not present

## 2022-12-22 DIAGNOSIS — I5022 Chronic systolic (congestive) heart failure: Secondary | ICD-10-CM | POA: Diagnosis not present

## 2022-12-22 DIAGNOSIS — F1020 Alcohol dependence, uncomplicated: Secondary | ICD-10-CM | POA: Diagnosis not present

## 2022-12-22 LAB — CULTURE, BLOOD (ROUTINE X 2)
Culture: NO GROWTH
Special Requests: ADEQUATE

## 2022-12-24 ENCOUNTER — Telehealth: Payer: Self-pay | Admitting: *Deleted

## 2022-12-24 ENCOUNTER — Encounter: Payer: Self-pay | Admitting: *Deleted

## 2022-12-24 NOTE — Transitions of Care (Post Inpatient/ED Visit) (Signed)
   12/24/2022  Name: Joel [name] MRN: [id number] DOB: Sep 15, 1995  Today's TOC FU Call Status: Today's TOC FU Call Status:: Unsuccessul Call (1st Attempt) Unsuccessful Call (1st Attempt) Date: 12/24/22  Attempted to reach the patient regarding the most recent Inpatient visit.  Attempt # 1 to preferred phone number listed for patient: Received automated outgoing voice message stating that the phone has a voice mail box that is full; unable to leave VM msg requesting call back   Attempt # 2 to alternate number listed for patient: Phone rang without physical or voice mail pick up; unable to leave voice message requesting call-back; eventually received automated mesage stating, "please enter your remote access code;" without voice messaging capability   Follow Up Plan: Additional outreach attempts will be made to reach the patient to complete the Transitions of Care (Post Inpatient visit) call.   Caryl Pina, RN, BSN, CCRN Alumnus RN CM Care Coordination/ Transition of Care- [facility] Care Management [phone]: direct office

## 2022-12-25 ENCOUNTER — Telehealth: Payer: Self-pay

## 2022-12-25 ENCOUNTER — Encounter: Payer: Self-pay | Admitting: *Deleted

## 2022-12-25 ENCOUNTER — Telehealth: Payer: Self-pay | Admitting: *Deleted

## 2022-12-25 NOTE — Transitions of Care (Post Inpatient/ED Visit) (Signed)
   12/25/2022  Name: Joel [name] MRN: [id number] DOB: Dec 28, 1995  Today's TOC FU Call Status: Today's TOC FU Call Status:: Unsuccessful Call (2nd Attempt) Unsuccessful Call (2nd Attempt) Date: 12/25/22 (subsequent to placing TOC call attempt # 2- saw note from [facility] PCP noting that patient has been transferred to inpatient drug rehab facility in [place]- further calls deferred accordingly)  Attempted to reach the patient regarding the most recent Inpatient visit; unable to leave voice message: voice mail box full  Follow Up Plan: No further outreach attempts will be made at this time per note earlier today by [facility] team, indicating patient is currently residing in [place] at drug rehabilitation facility- further [facility] calls deferred accordingly  Caryl Pina, RN, BSN, CCRN Alumnus RN CM Care Coordination/ Transition of Care- [facility] Care Management [phone]: direct office

## 2022-12-25 NOTE — Telephone Encounter (Signed)
I called the patient today to see about getting a hospital follow-up appointment scheduled.  I spoke with Joel [name]. Her daughter Joel [name] stated that Joel [name] is in rehab at [facility] in [place], [place]. While he was at the hospital, he tested positive for meth. He is getting treatment for substance abuse and mental health.

## 2023-01-01 ENCOUNTER — Telehealth (HOSPITAL_COMMUNITY): Payer: Self-pay

## 2023-01-01 NOTE — Telephone Encounter (Signed)
Called to confirm/remind patient of their appointment at the [facility] on 01/02/23. However patient's mailbox was full and was unable to leave a voice message.

## 2023-01-02 ENCOUNTER — Encounter (HOSPITAL_COMMUNITY): Payer: BC Managed Care – PPO

## 2023-01-08 DIAGNOSIS — F17200 Nicotine dependence, unspecified, uncomplicated: Secondary | ICD-10-CM | POA: Diagnosis not present

## 2023-01-08 DIAGNOSIS — F1520 Other stimulant dependence, uncomplicated: Secondary | ICD-10-CM | POA: Diagnosis not present

## 2023-01-08 DIAGNOSIS — F1020 Alcohol dependence, uncomplicated: Secondary | ICD-10-CM | POA: Diagnosis not present

## 2023-01-08 DIAGNOSIS — I10 Essential (primary) hypertension: Secondary | ICD-10-CM | POA: Diagnosis not present

## 2023-01-08 DIAGNOSIS — I5022 Chronic systolic (congestive) heart failure: Secondary | ICD-10-CM | POA: Diagnosis not present

## 2023-01-09 DIAGNOSIS — F1020 Alcohol dependence, uncomplicated: Secondary | ICD-10-CM | POA: Diagnosis not present

## 2023-01-09 DIAGNOSIS — F1520 Other stimulant dependence, uncomplicated: Secondary | ICD-10-CM | POA: Diagnosis not present

## 2023-01-10 DIAGNOSIS — F1020 Alcohol dependence, uncomplicated: Secondary | ICD-10-CM | POA: Diagnosis not present

## 2023-01-10 DIAGNOSIS — F1520 Other stimulant dependence, uncomplicated: Secondary | ICD-10-CM | POA: Diagnosis not present

## 2023-01-11 DIAGNOSIS — F1020 Alcohol dependence, uncomplicated: Secondary | ICD-10-CM | POA: Diagnosis not present

## 2023-01-11 DIAGNOSIS — F1520 Other stimulant dependence, uncomplicated: Secondary | ICD-10-CM | POA: Diagnosis not present

## 2023-01-12 DIAGNOSIS — F1020 Alcohol dependence, uncomplicated: Secondary | ICD-10-CM | POA: Diagnosis not present

## 2023-01-12 DIAGNOSIS — F1520 Other stimulant dependence, uncomplicated: Secondary | ICD-10-CM | POA: Diagnosis not present

## 2023-01-13 DIAGNOSIS — F1520 Other stimulant dependence, uncomplicated: Secondary | ICD-10-CM | POA: Diagnosis not present

## 2023-01-13 DIAGNOSIS — F1020 Alcohol dependence, uncomplicated: Secondary | ICD-10-CM | POA: Diagnosis not present

## 2023-01-14 DIAGNOSIS — F1020 Alcohol dependence, uncomplicated: Secondary | ICD-10-CM | POA: Diagnosis not present

## 2023-01-14 DIAGNOSIS — F1520 Other stimulant dependence, uncomplicated: Secondary | ICD-10-CM | POA: Diagnosis not present

## 2023-01-15 DIAGNOSIS — F1020 Alcohol dependence, uncomplicated: Secondary | ICD-10-CM | POA: Diagnosis not present

## 2023-01-15 DIAGNOSIS — F1520 Other stimulant dependence, uncomplicated: Secondary | ICD-10-CM | POA: Diagnosis not present

## 2023-01-16 DIAGNOSIS — F1020 Alcohol dependence, uncomplicated: Secondary | ICD-10-CM | POA: Diagnosis not present

## 2023-01-16 DIAGNOSIS — F1520 Other stimulant dependence, uncomplicated: Secondary | ICD-10-CM | POA: Diagnosis not present

## 2023-01-17 DIAGNOSIS — F1520 Other stimulant dependence, uncomplicated: Secondary | ICD-10-CM | POA: Diagnosis not present

## 2023-01-17 DIAGNOSIS — F1020 Alcohol dependence, uncomplicated: Secondary | ICD-10-CM | POA: Diagnosis not present

## 2023-01-18 DIAGNOSIS — F1020 Alcohol dependence, uncomplicated: Secondary | ICD-10-CM | POA: Diagnosis not present

## 2023-01-18 DIAGNOSIS — F1520 Other stimulant dependence, uncomplicated: Secondary | ICD-10-CM | POA: Diagnosis not present

## 2023-01-19 DIAGNOSIS — F1520 Other stimulant dependence, uncomplicated: Secondary | ICD-10-CM | POA: Diagnosis not present

## 2023-01-19 DIAGNOSIS — F1020 Alcohol dependence, uncomplicated: Secondary | ICD-10-CM | POA: Diagnosis not present

## 2023-01-20 DIAGNOSIS — F1520 Other stimulant dependence, uncomplicated: Secondary | ICD-10-CM | POA: Diagnosis not present

## 2023-01-20 DIAGNOSIS — F1020 Alcohol dependence, uncomplicated: Secondary | ICD-10-CM | POA: Diagnosis not present

## 2023-01-21 DIAGNOSIS — F1520 Other stimulant dependence, uncomplicated: Secondary | ICD-10-CM | POA: Diagnosis not present

## 2023-01-21 DIAGNOSIS — F1020 Alcohol dependence, uncomplicated: Secondary | ICD-10-CM | POA: Diagnosis not present

## 2023-01-22 DIAGNOSIS — F1520 Other stimulant dependence, uncomplicated: Secondary | ICD-10-CM | POA: Diagnosis not present

## 2023-01-22 DIAGNOSIS — F1020 Alcohol dependence, uncomplicated: Secondary | ICD-10-CM | POA: Diagnosis not present

## 2023-01-23 DIAGNOSIS — F1020 Alcohol dependence, uncomplicated: Secondary | ICD-10-CM | POA: Diagnosis not present

## 2023-01-23 DIAGNOSIS — F1520 Other stimulant dependence, uncomplicated: Secondary | ICD-10-CM | POA: Diagnosis not present

## 2023-01-24 DIAGNOSIS — F1520 Other stimulant dependence, uncomplicated: Secondary | ICD-10-CM | POA: Diagnosis not present

## 2023-01-24 DIAGNOSIS — F1020 Alcohol dependence, uncomplicated: Secondary | ICD-10-CM | POA: Diagnosis not present

## 2023-01-25 DIAGNOSIS — F1020 Alcohol dependence, uncomplicated: Secondary | ICD-10-CM | POA: Diagnosis not present

## 2023-01-25 DIAGNOSIS — F1520 Other stimulant dependence, uncomplicated: Secondary | ICD-10-CM | POA: Diagnosis not present

## 2023-01-28 DIAGNOSIS — F1020 Alcohol dependence, uncomplicated: Secondary | ICD-10-CM | POA: Diagnosis not present

## 2023-01-28 DIAGNOSIS — F1520 Other stimulant dependence, uncomplicated: Secondary | ICD-10-CM | POA: Diagnosis not present

## 2023-01-28 DIAGNOSIS — F1220 Cannabis dependence, uncomplicated: Secondary | ICD-10-CM | POA: Diagnosis not present

## 2023-01-29 DIAGNOSIS — F1020 Alcohol dependence, uncomplicated: Secondary | ICD-10-CM | POA: Diagnosis not present

## 2023-01-29 DIAGNOSIS — F1520 Other stimulant dependence, uncomplicated: Secondary | ICD-10-CM | POA: Diagnosis not present

## 2023-01-29 DIAGNOSIS — F1220 Cannabis dependence, uncomplicated: Secondary | ICD-10-CM | POA: Diagnosis not present

## 2023-01-30 DIAGNOSIS — F1220 Cannabis dependence, uncomplicated: Secondary | ICD-10-CM | POA: Diagnosis not present

## 2023-01-30 DIAGNOSIS — F1520 Other stimulant dependence, uncomplicated: Secondary | ICD-10-CM | POA: Diagnosis not present

## 2023-01-30 DIAGNOSIS — F1020 Alcohol dependence, uncomplicated: Secondary | ICD-10-CM | POA: Diagnosis not present

## 2023-01-31 ENCOUNTER — Encounter: Payer: Self-pay | Admitting: Physician Assistant

## 2023-01-31 ENCOUNTER — Ambulatory Visit: Payer: BC Managed Care – PPO | Admitting: Physician Assistant

## 2023-01-31 VITALS — BP 118/70 | HR 51 | Temp 97.2°F | Ht 71.0 in | Wt 305.0 lb

## 2023-01-31 DIAGNOSIS — L749 Eccrine sweat disorder, unspecified: Secondary | ICD-10-CM

## 2023-01-31 DIAGNOSIS — F411 Generalized anxiety disorder: Secondary | ICD-10-CM

## 2023-01-31 DIAGNOSIS — I5042 Chronic combined systolic (congestive) and diastolic (congestive) heart failure: Secondary | ICD-10-CM | POA: Diagnosis not present

## 2023-01-31 DIAGNOSIS — I420 Dilated cardiomyopathy: Secondary | ICD-10-CM

## 2023-01-31 DIAGNOSIS — F909 Attention-deficit hyperactivity disorder, unspecified type: Secondary | ICD-10-CM | POA: Insufficient documentation

## 2023-01-31 DIAGNOSIS — F902 Attention-deficit hyperactivity disorder, combined type: Secondary | ICD-10-CM

## 2023-01-31 DIAGNOSIS — E782 Mixed hyperlipidemia: Secondary | ICD-10-CM | POA: Diagnosis not present

## 2023-01-31 LAB — POCT URINALYSIS DIP (CLINITEK)
Bilirubin, UA: NEGATIVE
Blood, UA: NEGATIVE
Glucose, UA: 250 mg/dL — AB
Ketones, POC UA: NEGATIVE mg/dL
Leukocytes, UA: NEGATIVE
Nitrite, UA: NEGATIVE
POC PROTEIN,UA: NEGATIVE
Spec Grav, UA: 1.015 (ref 1.010–1.025)
Urobilinogen, UA: 0.2 E.U./dL
pH, UA: 6 (ref 5.0–8.0)

## 2023-01-31 MED ORDER — ATOMOXETINE HCL 40 MG PO CAPS: 40.0000 mg | ORAL_CAPSULE | Freq: Every day | ORAL | 0 refills | Status: DC

## 2023-01-31 NOTE — Assessment & Plan Note (Signed)
Controlled Currently using diet and exercise to manage Labs drawn today Will adjust treatment as needed based on labs

## 2023-01-31 NOTE — Assessment & Plan Note (Signed)
Labs drawn today Will adjust treatment depending on labs

## 2023-01-31 NOTE — Assessment & Plan Note (Signed)
Controlled Continue taking Wellbutrin 300 as directed Will adjust treatment as needed based on symptoms

## 2023-01-31 NOTE — Assessment & Plan Note (Signed)
Uncontrolled Increased Strattera to 40mg  from 25mg  Will adjust treatment based on symptoms

## 2023-01-31 NOTE — Progress Notes (Signed)
Subjective:  Patient ID: Joel [name], male    DOB: 03-10-1996  Age: 27 y.o. MRN: [id number]  Chief Complaint  Patient presents with   Medical Management of Chronic Issues    HPI  Today patient is 45 days sober. Went to [employer] in [place] at [facility].   Hyperlipidemia: Currently not on any medications: While in rehab the Doctor down in [place] informed him he did not need any treatment for his cholesterol anymore.   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 [name] 02/11/23, currently taking Entresto 49-51 mg BID, Digoxin 0.125 daily, Spironolactone 25 mg daily, Carvedilol 6.25 mg BID, Torsemide 20 mg daily, eliquis 5 mg BID, Farxiga 10 mg daily, potassium 20 meq daily.  Anxiety: Wellbutrin XL 300 mg daily (and helps with alcohol cravings) GAD-& score: 3  ADHD: Strattera 25 mg daily would like to increase if possible.States he has still been having issues focusing during his group therapy sessions. He is unable to complete tasks or stay on one task for extended periods of time.   States he sweats excessively. Stated that this had happened before, but it was usually from his drug use.      01/31/2023    1:54 PM 01/29/2022    1:39 PM 03/06/2021    8:24 PM 10/05/2020    2:03 PM  Depression screen PHQ 2/9  Decreased Interest 0 1 0 0  Down, Depressed, Hopeless 0 0 0 0  PHQ - 2 Score 0 1 0 0  Altered sleeping 0 1    Tired, decreased energy 0 1    Change in appetite 0 2    Feeling bad or failure about yourself  0 1    Trouble concentrating 1 3    Moving slowly or fidgety/restless 0 3    Suicidal thoughts 0 0    PHQ-9 Score 1 12    Difficult doing work/chores Not difficult at all Somewhat difficult          01/31/2023    1:54 PM  Fall Risk   Falls in the past year? 0  Number falls in past yr: 0  Injury with Fall? 0  Risk for fall due to : No Fall Risks    Patient Care Team: Langley Gauss, [place] as PCP - General (Physician  Assistant) Laurey Morale, MD as PCP - Advanced Heart Failure (Cardiology)   Review of Systems  Constitutional:  Positive for diaphoresis. Negative for chills, fatigue and fever.  HENT:  Negative for congestion, ear pain and sore throat.   Respiratory:  Negative for cough and shortness of breath.   Cardiovascular:  Negative for chest pain and leg swelling.  Gastrointestinal:  Negative for abdominal pain, constipation, diarrhea, nausea and vomiting.  Genitourinary:  Negative for dysuria and urgency.  Musculoskeletal:  Negative for arthralgias and myalgias.  Neurological:  Negative for dizziness and headaches.  Psychiatric/Behavioral:  Negative for dysphoric mood.     Current Outpatient Medications on File Prior to Visit  Medication Sig Dispense Refill   apixaban (ELIQUIS) 5 MG TABS tablet Take 1 tablet (5 mg total) by mouth 2 (two) times daily. 60 tablet 2   buPROPion (WELLBUTRIN XL) 300 MG 24 hr tablet Take 300 mg by mouth daily.     carvedilol (COREG) 6.25 MG tablet Take 1 tablet (6.25 mg total) by mouth 2 (two) times daily with a meal. 60 tablet 2   dapagliflozin propanediol (FARXIGA) 10 MG TABS tablet Take 1 tablet (10  mg total) by mouth daily. 30 tablet 2   digoxin (LANOXIN) 0.125 MG tablet Take 1 tablet (0.125 mg total) by mouth daily. 30 tablet 2   potassium chloride SA (KLOR-CON M) 20 MEQ tablet Take 1 tablet (20 mEq total) by mouth daily. 30 tablet 2   sacubitril-valsartan (ENTRESTO) 49-51 MG Take 1 tablet by mouth 2 (two) times daily. 60 tablet 2   spironolactone (ALDACTONE) 25 MG tablet Take 1 tablet (25 mg total) by mouth daily. 30 tablet 2   torsemide (DEMADEX) 20 MG tablet Take 2 tablets (40 mg total) by mouth daily. 60 tablet 2   folic acid (FOLVITE) 1 MG tablet Take 1 tablet (1 mg total) by mouth daily. (Patient not taking: Reported on 01/31/2023) 30 tablet 2   No current facility-administered medications on file prior to visit.   Past Medical History:  Diagnosis Date    Anxiety    Cerebral embolism with cerebral infarction 04/28/2020   Chronic systolic CHF 04/24/2020   Echo at [facility] 04/2020: EF < 20, mild to mod MR, LV thrombus   Depression    Dilated cardiomyopathy (HCC)    History of COVID-19    Hypertension    LV (left ventricular) mural thrombus    Past Surgical History:  Procedure Laterality Date   TONSILLECTOMY      Family History  Problem Relation Age of Onset   Diabetes Mother    Diabetes Paternal Grandfather    Heart failure Neg Hx    Social History   Socioeconomic History   Marital status: Single    Spouse name: Not on file   Number of children: Not on file   Years of education: Not on file   Highest education level: Not on file  Occupational History   Occupation: Full time/Plumbing  Tobacco Use   Smoking status: Former    Types: E-cigarettes   Smokeless tobacco: Current    Types: Chew   Tobacco comments:    quit around June 2021  Vaping Use   Vaping status: Every Day   Substances: Nicotine  Substance and Sexual Activity   Alcohol use: Not Currently    Alcohol/week: 7.0 standard drinks of alcohol    Types: 7 Cans of beer per week   Drug use: Not Currently    Types: Marijuana, Methamphetamines   Sexual activity: Yes    Partners: Female    Birth control/protection: Condom  Other Topics Concern   Not on file  Social History Narrative   Lives with grandmother, grandfather and sister   Right Handed   Drinks rarely caffeine   Social Determinants of Health   Financial Resource Strain: Low Risk  (01/31/2023)   Overall Financial Resource Strain (CARDIA)    Difficulty of Paying Living Expenses: Not hard at all  Food Insecurity: No Food Insecurity (12/17/2022)   Hunger Vital Sign    Worried About Running Out of Food in the Last Year: Never true    Ran Out of Food in the Last Year: Never true  Transportation Needs: No Transportation Needs (12/17/2022)   PRAPARE - Administrator, Civil Service (Medical):  No    Lack of Transportation (Non-Medical): No  Physical Activity: Inactive (01/31/2023)   Exercise Vital Sign    Days of Exercise per Week: 0 days    Minutes of Exercise per Session: 0 min  Stress: No Stress Concern Present (01/31/2023)   [employer] of Occupational Health - Occupational Stress Questionnaire    Feeling of  Stress : Not at all  Social Connections: Moderately Isolated (01/31/2023)   Social Connection and Isolation Panel [NHANES]    Frequency of Communication with Friends and Family: More than three times a week    Frequency of Social Gatherings with Friends and Family: More than three times a week    Attends Religious Services: More than 4 times per year    Active Member of [employer] or Organizations: No    Attends Engineer, structural: Never    Marital Status: Never married    Objective:  BP 118/70   Pulse (!) 51   Temp (!) 97.2 F (36.2 C)   Ht 5\' 11"  (1.803 m)   Wt (!) 305 lb (138.3 kg)   SpO2 99%   BMI 42.54 kg/m      01/31/2023    1:46 PM 12/21/2022   11:47 AM 12/21/2022    9:59 AM  BP/Weight  Systolic BP 118 120 132  Diastolic BP 70 76 84  Wt. (Lbs) 305    BMI 42.54 kg/m2      Physical Exam Vitals reviewed.  Constitutional:      Appearance: Normal appearance. He is normal weight.  Cardiovascular:     Rate and Rhythm: Normal rate and regular rhythm.     Heart sounds: No murmur heard. Pulmonary:     Effort: Pulmonary effort is normal.     Breath sounds: Normal breath sounds.  Abdominal:     General: Abdomen is flat. Bowel sounds are normal.     Palpations: Abdomen is soft.     Tenderness: There is no abdominal tenderness.  Skin:    General: Skin is cool and moist.     Comments: Excessive sweating noted coming through his shirt  Neurological:     Mental Status: He is alert and oriented to person, place, and time.  Psychiatric:        Mood and Affect: Mood normal.        Behavior: Behavior normal.     Diabetic Foot Exam - Simple    No data filed      Lab Results  Component Value Date   WBC 12.7 (H) 12/21/2022   HGB 18.2 (H) 12/21/2022   HCT 52.9 (H) 12/21/2022   PLT 315 12/21/2022   GLUCOSE 114 (H) 12/21/2022   CHOL 153 06/29/2020   TRIG 74 06/29/2020   HDL 50 06/29/2020   LDLCALC 88 06/29/2020   ALT 68 (H) 12/20/2022   AST 41 12/20/2022   NA 135 12/21/2022   K 3.9 12/21/2022   CL 106 12/21/2022   CREATININE 1.30 (H) 12/21/2022   BUN 23 (H) 12/21/2022   CO2 17 (L) 12/21/2022   TSH 1.420 10/05/2020   INR 1.1 12/17/2022   HGBA1C 5.2 10/31/2021      Assessment & Plan:    Dilated cardiomyopathy (HCC) Assessment & Plan: Controlled Continue taking medications as directed Has appointment with Cardiology Will follow up after next appointment  Orders: -     POCT URINALYSIS DIP (CLINITEK)  Mixed hyperlipidemia Assessment & Plan: Controlled Currently using diet and exercise to manage Labs drawn today Will adjust treatment as needed based on labs  Orders: -     Lipid panel  Chronic combined systolic and diastolic congestive heart failure (HCC) Assessment & Plan: Controlled Continue taking your Carvedilol 6.25mg , Eliquis 5mg , Digoxin .125mg  Has follow up with cardiology  Will follow up after their appointment to continue managment  Orders: -     POCT  URINALYSIS DIP (CLINITEK)  GAD (generalized anxiety disorder) Assessment & Plan: Controlled Continue taking Wellbutrin 300 as directed Will adjust treatment as needed based on symptoms   Sweating abnormality Assessment & Plan: Labs drawn today Will adjust treatment depending on labs  Orders: -     B12 and Folate Panel -     TSH + free T4 -     POCT URINALYSIS DIP (CLINITEK)  Morbid obesity (HCC) -     CBC with Differential/Platelet -     Comprehensive metabolic panel -     Hemoglobin A1c -     POCT URINALYSIS DIP (CLINITEK)  Attention deficit hyperactivity disorder (ADHD), combined type Assessment &  Plan: Uncontrolled Increased Strattera to 40mg  from 25mg  Will adjust treatment based on symptoms   Other orders -     Atomoxetine HCl; Take 1 capsule (40 mg total) by mouth daily.  Dispense: 30 capsule; Refill: 0     Meds ordered this encounter  Medications   atomoxetine (STRATTERA) 40 MG capsule    Sig: Take 1 capsule (40 mg total) by mouth daily.    Dispense:  30 capsule    Refill:  0    Orders Placed This Encounter  Procedures   B12 and Folate Panel   TSH + free T4   CBC with Differential/Platelet   Comprehensive metabolic panel   Hemoglobin A1c   Lipid panel   POCT URINALYSIS DIP (CLINITEK)     Follow-up: Return in about 4 weeks (around 02/28/2023) for Chronic, Huston Foley.   I,Katherina A Bramblett,acting as a scribe for [employer], PA.,have documented all relevant documentation on the behalf of Langley Gauss, PA,as directed by  Langley Gauss, PA while in the presence of Langley Gauss, [place].   An After Visit Summary was printed and given to the patient.  Langley Gauss, [place] [facility] [phone]

## 2023-01-31 NOTE — Assessment & Plan Note (Signed)
Controlled Continue taking medications as directed Has appointment with Cardiology Will follow up after next appointment

## 2023-01-31 NOTE — Assessment & Plan Note (Signed)
Controlled Continue taking your Carvedilol 6.25mg , Eliquis 5mg , Digoxin .125mg  Has follow up with cardiology  Will follow up after their appointment to continue managment

## 2023-02-01 ENCOUNTER — Encounter: Payer: Self-pay | Admitting: Physician Assistant

## 2023-02-01 DIAGNOSIS — F1520 Other stimulant dependence, uncomplicated: Secondary | ICD-10-CM | POA: Diagnosis not present

## 2023-02-01 DIAGNOSIS — F1220 Cannabis dependence, uncomplicated: Secondary | ICD-10-CM | POA: Diagnosis not present

## 2023-02-01 DIAGNOSIS — F1020 Alcohol dependence, uncomplicated: Secondary | ICD-10-CM | POA: Diagnosis not present

## 2023-02-04 DIAGNOSIS — F1520 Other stimulant dependence, uncomplicated: Secondary | ICD-10-CM | POA: Diagnosis not present

## 2023-02-04 DIAGNOSIS — F1020 Alcohol dependence, uncomplicated: Secondary | ICD-10-CM | POA: Diagnosis not present

## 2023-02-04 DIAGNOSIS — F1220 Cannabis dependence, uncomplicated: Secondary | ICD-10-CM | POA: Diagnosis not present

## 2023-02-05 DIAGNOSIS — F1520 Other stimulant dependence, uncomplicated: Secondary | ICD-10-CM | POA: Diagnosis not present

## 2023-02-05 DIAGNOSIS — F1220 Cannabis dependence, uncomplicated: Secondary | ICD-10-CM | POA: Diagnosis not present

## 2023-02-05 DIAGNOSIS — F1020 Alcohol dependence, uncomplicated: Secondary | ICD-10-CM | POA: Diagnosis not present

## 2023-02-06 ENCOUNTER — Ambulatory Visit: Payer: BC Managed Care – PPO | Admitting: Family Medicine

## 2023-02-06 DIAGNOSIS — F1020 Alcohol dependence, uncomplicated: Secondary | ICD-10-CM | POA: Diagnosis not present

## 2023-02-06 DIAGNOSIS — F1220 Cannabis dependence, uncomplicated: Secondary | ICD-10-CM | POA: Diagnosis not present

## 2023-02-06 DIAGNOSIS — F1520 Other stimulant dependence, uncomplicated: Secondary | ICD-10-CM | POA: Diagnosis not present

## 2023-02-08 ENCOUNTER — Telehealth (HOSPITAL_COMMUNITY): Payer: Self-pay

## 2023-02-08 DIAGNOSIS — F1220 Cannabis dependence, uncomplicated: Secondary | ICD-10-CM | POA: Diagnosis not present

## 2023-02-08 DIAGNOSIS — F1020 Alcohol dependence, uncomplicated: Secondary | ICD-10-CM | POA: Diagnosis not present

## 2023-02-08 DIAGNOSIS — F1520 Other stimulant dependence, uncomplicated: Secondary | ICD-10-CM | POA: Diagnosis not present

## 2023-02-08 NOTE — Progress Notes (Signed)
PCP: Joel Gauss, PA  Cardiology: Dr. [name] is 27 y.o. male with a hx of hypertension, anxiety and prior COVID-19 infection in June in 2021 who presented to [facility] in 10/21 with shortness of breath and hemoptysis x 1 month.  An echocardiogram demonstrated EF < 20% and an LV thrombus.  A CT scan demonstrated multifocal pneumonia, no PE.  He was started on furosemide and Lovenox and transferred to [facility] for further evaluation and management.  He was started on milrinone with low output by co-ox and diuresed. Cardiac MRI showed severe LV dilation with diffuse hypokinesis, EF 15%, small LV apical thrombus, moderate RV dilation with EF 19%, LGE pattern concerning for myocarditis with diffuse patchy mid-wall LGE.  He was treated with ceftriaxone and doxycycline for PNA. He had a CVA with left-sided weakness and left facial numbness.  MRI showed multifocal bilateral infarcts consistent with cardio-embolism.  He was transitioned from Lovenox/warfarin to IV heparin gtt/warfarin.  Left-sided weakness resolved. He was titrated onto cardiac meds and eventually discharged home.   Echo in 3/22 showed EF 35% with global hypokinesis, mild LV dilation, mildly decreased RV systolic function. No LV thrombus.     Echo 9/22 showed EF 45% with mild LVH, diffuse hypokinesis, normal RV, no thrombus.   Today he returns for HF follow up with his grandfather.  Weight is down 16 lbs.  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  Spending a lot of time outside.  No exertional dyspnea or chest pain.  Bothered mainly by allergic rhinitis/congestion.  No orthopnea/PND.  No palpitations. Occasional ETOH.  No drugs.   ECG (personally reviewed): NSR, normal  Labs (10/21): K 4.9, creatinine 1.06 Labs (11/21): K 5, creatinine 0.96 Labs (12/21): digoxin 0.7, LDL 80, HDL 50, K 4.3, creatinine 7.25 Labs (7/22): K 4.7, creatinine 1.11 Labs (10/22): K 4.4,  creatinine 0.80 Labs (5/23): K 4.7, creatinine 0.85  PMH: 1. H/o COVID-19 PNA 6/21.  2. HTN 3. Anxiety 4. Chronic systolic CHF: Nonischemic cardiomyopathy.   - Echo (10/21): EF < 20%, LV thrombus.  - Cardiac MRI (10/21): Severe LV dilation with diffuse hypokinesis, EF 15%, small LV apical thrombus, moderate RV dilation with EF 19%, LGE pattern concerning for myocarditis, with diffuse patchy mid-wall LGE. - Echo (3/22): EF 35% with global hypokinesis, mild LV dilation, mildly decreased RV systolic function. No LV thrombus. - Echo (9/22): EF 45% with mild LVH, diffuse hypokinesis, normal RV, no thrombus.  5. CVA: 10/21, suspect cardio-embolic.  6. LV apical thrombus 7. Sleep study negative.  8. Crystal meth abuse  Social History   Socioeconomic History   Marital status: Single    Spouse name: Not on file   Number of children: Not on file   Years of education: Not on file   Highest education level: Not on file  Occupational History   Occupation: Full time/Plumbing  Tobacco Use   Smoking status: Former    Types: E-cigarettes   Smokeless tobacco: Current    Types: Chew   Tobacco comments:    quit around June 2021  Vaping Use   Vaping status: Every Day   Substances: Nicotine  Substance and Sexual Activity   Alcohol use: Not Currently    Alcohol/week: 7.0 standard drinks of alcohol    Types: 7 Cans of beer per week   Drug use: Not Currently    Types: Marijuana, Methamphetamines   Sexual activity: Yes  Partners: Female    Birth control/protection: Condom  Other Topics Concern   Not on file  Social History Narrative   Lives with grandmother, grandfather and sister   Right Handed   Drinks rarely caffeine   Social Determinants of Health   Financial Resource Strain: Low Risk  (01/31/2023)   Overall Financial Resource Strain (CARDIA)    Difficulty of Paying Living Expenses: Not hard at all  Food Insecurity: No Food Insecurity (12/17/2022)   Hunger Vital Sign    Worried  About Running Out of Food in the Last Year: Never true    Ran Out of Food in the Last Year: Never true  Transportation Needs: No Transportation Needs (12/17/2022)   PRAPARE - Administrator, Civil Service (Medical): No    Lack of Transportation (Non-Medical): No  Physical Activity: Inactive (01/31/2023)   Exercise Vital Sign    Days of Exercise per Week: 0 days    Minutes of Exercise per Session: 0 min  Stress: No Stress Concern Present (01/31/2023)   [employer] of Occupational Health - Occupational Stress Questionnaire    Feeling of Stress : Not at all  Social Connections: Moderately Isolated (01/31/2023)   Social Connection and Isolation Panel [NHANES]    Frequency of Communication with Friends and Family: More than three times a week    Frequency of Social Gatherings with Friends and Family: More than three times a week    Attends Religious Services: More than 4 times per year    Active Member of [employer] or Organizations: No    Attends Banker Meetings: Never    Marital Status: Never married  Intimate Partner Violence: Not At Risk (12/17/2022)   Humiliation, Afraid, Rape, and Kick questionnaire    Fear of Current or Ex-Partner: No    Emotionally Abused: No    Physically Abused: No    Sexually Abused: No   Family History  Problem Relation Age of Onset   Diabetes Mother    Diabetes Paternal Grandfather    Heart failure Neg Hx    ROS: All systems reviewed and negative except as per HPI.   Current Outpatient Medications  Medication Sig Dispense Refill   apixaban (ELIQUIS) 5 MG TABS tablet Take 1 tablet (5 mg total) by mouth 2 (two) times daily. 60 tablet 2   atomoxetine (STRATTERA) 40 MG capsule Take 1 capsule (40 mg total) by mouth daily. 30 capsule 0   buPROPion (WELLBUTRIN XL) 300 MG 24 hr tablet Take 300 mg by mouth daily.     carvedilol (COREG) 6.25 MG tablet Take 1 tablet (6.25 mg total) by mouth 2 (two) times daily with a meal. 60 tablet 2    dapagliflozin propanediol (FARXIGA) 10 MG TABS tablet Take 1 tablet (10 mg total) by mouth daily. 30 tablet 2   digoxin (LANOXIN) 0.125 MG tablet Take 1 tablet (0.125 mg total) by mouth daily. 30 tablet 2   folic acid (FOLVITE) 1 MG tablet Take 1 tablet (1 mg total) by mouth daily. (Patient not taking: Reported on 01/31/2023) 30 tablet 2   potassium chloride SA (KLOR-CON M) 20 MEQ tablet Take 1 tablet (20 mEq total) by mouth daily. 30 tablet 2   sacubitril-valsartan (ENTRESTO) 49-51 MG Take 1 tablet by mouth 2 (two) times daily. 60 tablet 2   spironolactone (ALDACTONE) 25 MG tablet Take 1 tablet (25 mg total) by mouth daily. 30 tablet 2   torsemide (DEMADEX) 20 MG tablet Take 2 tablets (40 mg  total) by mouth daily. 60 tablet 2   No current facility-administered medications for this visit.   Wt Readings from Last 3 Encounters:  01/31/23 (!) 138.3 kg (305 lb)  12/21/22 129.5 kg (285 lb 9.6 oz)  12/17/22 (!) 137.4 kg (303 lb)   There were no vitals taken for this visit. General:  NAD. No resp difficulty HEENT: Normal Neck: Supple. JVP 7-8. Carotids 2+ bilat; no bruits. No lymphadenopathy or thryomegaly appreciated. Cor: PMI nondisplaced. Regular rate & rhythm. No rubs, gallops or murmurs. Lungs: Clear Abdomen: Obese,  nontender, nondistended. No hepatosplenomegaly. No bruits or masses. Good bowel sounds. Extremities: No cyanosis, clubbing, rash, edema Neuro: Alert & oriented x 3, cranial nerves grossly intact. Moves all 4 extremities w/o difficulty. Affect pleasant.  Assessment/Plan: 1. Chronic systolic CHF: Appears to be nonischemic cardiomyopathy.  Echo in 10/21 at [facility] with EF < 20%, LV thrombus noted.  When he presented in 10/21, he had had dyspnea, orthopnea, and pleuritic chest pain since he had COVID-19 in 6/21. He feels like he never improved.  No family history of cardiomyopathy or premature CAD.  HIV negative. CMRI in 10/21 with LVEF 15%, RVEF 19%, LGE pattern concerning for  myocarditis. Suspect most likely etiology for cardiomyopathy is viral myocarditis due to COVID-19 though methamphetamine use may have contributed as well.  Echo in 3/22 showed improved EF to 35%. Echo 9/22 showed EF up to 45%. NYHA class I, not volume overloaded on exam.  - Use Lasix prn.  - Continue Entresto 97/103 mg bid.   - Continue spironolactone 50 mg daily. BMET today. - Continue Farxiga 10 mg daily. - Continue Coreg 25 mg bid.  - I will arrange for repeat echo.    2. LV thrombus: On warfarin, INR followed at [facility] in [place]. No thrombus seen on recent echo 9/22.  - Repeat echo. If EF > 50%, will stop warfarin.  3. CVA: Cardioembolic from LV thrombus.  Seems to have minimal residual symptoms (mild tingling left face). - Continue warfarin until EF has recovered.  4. Methamphetamine abuse: He has quit.  - Strongly urged him not to relapse.  5. HTN: Stable. Continue current regimen.   Followup in 6 months with APP but will need BMET in 3 months.   Anderson [facility]  02/08/2023

## 2023-02-08 NOTE — Telephone Encounter (Signed)
Called to confirm/remind patient of their appointment at the [facility] on 02/11/23.   Patient reminded to bring all medications and/or complete list.  Confirmed patient has transportation. Gave directions, instructed to utilize valet parking.  Confirmed appointment prior to ending call.

## 2023-02-11 ENCOUNTER — Ambulatory Visit (HOSPITAL_COMMUNITY)
Admission: RE | Admit: 2023-02-11 | Discharge: 2023-02-11 | Disposition: A | Discharge status: Home / Self Care | Payer: BC Managed Care – PPO | Source: Ambulatory Visit | Attending: Family Medicine | Admitting: Family Medicine

## 2023-02-11 ENCOUNTER — Encounter (HOSPITAL_COMMUNITY): Payer: Self-pay

## 2023-02-11 VITALS — BP 114/80 | HR 100 | Wt 304.8 lb

## 2023-02-11 DIAGNOSIS — E785 Hyperlipidemia, unspecified: Secondary | ICD-10-CM | POA: Diagnosis not present

## 2023-02-11 DIAGNOSIS — F1910 Other psychoactive substance abuse, uncomplicated: Secondary | ICD-10-CM

## 2023-02-11 DIAGNOSIS — Z7901 Long term (current) use of anticoagulants: Secondary | ICD-10-CM | POA: Diagnosis not present

## 2023-02-11 DIAGNOSIS — Z6841 Body Mass Index (BMI) 40.0 and over, adult: Secondary | ICD-10-CM | POA: Insufficient documentation

## 2023-02-11 DIAGNOSIS — E782 Mixed hyperlipidemia: Secondary | ICD-10-CM

## 2023-02-11 DIAGNOSIS — F909 Attention-deficit hyperactivity disorder, unspecified type: Secondary | ICD-10-CM | POA: Insufficient documentation

## 2023-02-11 DIAGNOSIS — F419 Anxiety disorder, unspecified: Secondary | ICD-10-CM | POA: Insufficient documentation

## 2023-02-11 DIAGNOSIS — F1510 Other stimulant abuse, uncomplicated: Secondary | ICD-10-CM | POA: Diagnosis not present

## 2023-02-11 DIAGNOSIS — Z8673 Personal history of transient ischemic attack (TIA), and cerebral infarction without residual deficits: Secondary | ICD-10-CM | POA: Insufficient documentation

## 2023-02-11 DIAGNOSIS — Z8616 Personal history of COVID-19: Secondary | ICD-10-CM | POA: Insufficient documentation

## 2023-02-11 DIAGNOSIS — E669 Obesity, unspecified: Secondary | ICD-10-CM | POA: Insufficient documentation

## 2023-02-11 DIAGNOSIS — I5022 Chronic systolic (congestive) heart failure: Secondary | ICD-10-CM

## 2023-02-11 DIAGNOSIS — I110 Hypertensive heart disease with heart failure: Secondary | ICD-10-CM | POA: Diagnosis not present

## 2023-02-11 DIAGNOSIS — Z79899 Other long term (current) drug therapy: Secondary | ICD-10-CM | POA: Diagnosis not present

## 2023-02-11 DIAGNOSIS — F1520 Other stimulant dependence, uncomplicated: Secondary | ICD-10-CM | POA: Diagnosis not present

## 2023-02-11 DIAGNOSIS — I428 Other cardiomyopathies: Secondary | ICD-10-CM | POA: Insufficient documentation

## 2023-02-11 DIAGNOSIS — R7303 Prediabetes: Secondary | ICD-10-CM | POA: Insufficient documentation

## 2023-02-11 DIAGNOSIS — F1220 Cannabis dependence, uncomplicated: Secondary | ICD-10-CM | POA: Diagnosis not present

## 2023-02-11 DIAGNOSIS — I10 Essential (primary) hypertension: Secondary | ICD-10-CM

## 2023-02-11 DIAGNOSIS — I5042 Chronic combined systolic (congestive) and diastolic (congestive) heart failure: Secondary | ICD-10-CM

## 2023-02-11 DIAGNOSIS — I240 Acute coronary thrombosis not resulting in myocardial infarction: Secondary | ICD-10-CM | POA: Diagnosis not present

## 2023-02-11 DIAGNOSIS — F1020 Alcohol dependence, uncomplicated: Secondary | ICD-10-CM | POA: Diagnosis not present

## 2023-02-11 LAB — DIGOXIN LEVEL: Digoxin Level: 0.5 ng/mL — ABNORMAL LOW (ref 0.8–2.0)

## 2023-02-11 LAB — BRAIN NATRIURETIC PEPTIDE: B Natriuretic Peptide: 36.1 pg/mL (ref 0.0–100.0)

## 2023-02-11 MED ORDER — TORSEMIDE 20 MG PO TABS: 40.0000 mg | ORAL_TABLET | Freq: Two times a day (BID) | ORAL | 2 refills | Status: DC

## 2023-02-11 MED ORDER — ATORVASTATIN CALCIUM 10 MG PO TABS: 10.0000 mg | ORAL_TABLET | Freq: Every day | ORAL | 3 refills | Status: AC

## 2023-02-11 MED ORDER — POTASSIUM CHLORIDE CRYS ER 20 MEQ PO TBCR: 40.0000 meq | EXTENDED_RELEASE_TABLET | Freq: Every day | ORAL | 2 refills | Status: AC

## 2023-02-11 NOTE — Progress Notes (Signed)
ReDS Vest / Clip - 02/11/23 1500       ReDS Vest / Clip   Station Marker D    Ruler Value 39    ReDS Value Range High volume overload    ReDS Actual Value 46

## 2023-02-11 NOTE — Patient Instructions (Signed)
Medication Changes:  RESTART ATORVASTATIN 10MG  DAILY  INCREASE TORSEMIDE TO 40MG  TWICE DAILY   INCREASE POTASSIUM TO ONCE DAILY   Lab Work:  Labs done today, your results will be available in MyChart, we will contact you for abnormal readings.  THEN RETURN FOR BLOOD WORK IN 10 DAYS   AND   THEN RETURN FOR LABS IN 8 WEEKS AS SCHEDULED   Testing/Procedures:  Your physician has requested that you have an echocardiogram IN 8 WEEKS AS SCHEDULED . Echocardiography is a painless test that uses sound waves to create images of your heart. It provides your doctor with information about the size and shape of your heart and how well your heart's chambers and valves are working. This procedure takes approximately one hour. There are no restrictions for this procedure. Please do NOT wear cologne, perfume, aftershave, or lotions (deodorant is allowed). Please arrive 15 minutes prior to your appointment time.   Referrals:  YOU HAVE BEEN REFERRED TO PHARMD AT [facility] THEY WILL REACH OUT TO YOU OR CALL TO ARRANGE THIS. PLEASE CALL us WITH ANY CONCERNS   Follow-Up in: 8 WEEKS WITH DR. [name] AS SCHEDULED   At the [facility], you and your health needs are our priority. We have a designated team specialized in the treatment of Heart Failure. This Care Team includes your primary Heart Failure Specialized Cardiologist (physician), Advanced Practice Providers (APPs- Physician Assistants and Nurse Practitioners), and Pharmacist who all work together to provide you with the care you need, when you need it.   You may see any of the following providers on your designated Care Team at your next follow up:  Dr. Arvilla Meres Dr. [name] Dr. Marcos Eke, NP Robbie Lis, [place] [facility] [place], [place] Brynda Peon, NP Karle Plumber, PharmD   Please be sure to bring in all your medications bottles to every appointment.   Need to  Contact us:  If you have any questions or concerns before your next appointment please send [place] a message through [place] or call our office at [phone].    TO LEAVE A MESSAGE FOR THE NURSE SELECT OPTION 2, PLEASE LEAVE A MESSAGE INCLUDING: YOUR NAME DATE OF BIRTH CALL BACK NUMBER REASON FOR CALL**this is important as we prioritize the call backs  YOU WILL RECEIVE A CALL BACK THE SAME DAY AS LONG AS YOU CALL BEFORE 4:00 PM

## 2023-02-12 DIAGNOSIS — F1020 Alcohol dependence, uncomplicated: Secondary | ICD-10-CM | POA: Diagnosis not present

## 2023-02-12 DIAGNOSIS — F1520 Other stimulant dependence, uncomplicated: Secondary | ICD-10-CM | POA: Diagnosis not present

## 2023-02-12 DIAGNOSIS — F1220 Cannabis dependence, uncomplicated: Secondary | ICD-10-CM | POA: Diagnosis not present

## 2023-02-13 DIAGNOSIS — F1520 Other stimulant dependence, uncomplicated: Secondary | ICD-10-CM | POA: Diagnosis not present

## 2023-02-13 DIAGNOSIS — F1220 Cannabis dependence, uncomplicated: Secondary | ICD-10-CM | POA: Diagnosis not present

## 2023-02-13 DIAGNOSIS — F1020 Alcohol dependence, uncomplicated: Secondary | ICD-10-CM | POA: Diagnosis not present

## 2023-02-15 DIAGNOSIS — F1020 Alcohol dependence, uncomplicated: Secondary | ICD-10-CM | POA: Diagnosis not present

## 2023-02-15 DIAGNOSIS — F1220 Cannabis dependence, uncomplicated: Secondary | ICD-10-CM | POA: Diagnosis not present

## 2023-02-15 DIAGNOSIS — F1520 Other stimulant dependence, uncomplicated: Secondary | ICD-10-CM | POA: Diagnosis not present

## 2023-02-18 ENCOUNTER — Other Ambulatory Visit: Payer: Self-pay

## 2023-02-18 DIAGNOSIS — F1520 Other stimulant dependence, uncomplicated: Secondary | ICD-10-CM | POA: Diagnosis not present

## 2023-02-18 DIAGNOSIS — F1020 Alcohol dependence, uncomplicated: Secondary | ICD-10-CM | POA: Diagnosis not present

## 2023-02-18 DIAGNOSIS — F1220 Cannabis dependence, uncomplicated: Secondary | ICD-10-CM | POA: Diagnosis not present

## 2023-02-18 MED ORDER — SACUBITRIL-VALSARTAN 49-51 MG PO TABS: 1.0000 | ORAL_TABLET | Freq: Two times a day (BID) | ORAL | 2 refills | Status: DC

## 2023-02-18 MED ORDER — CARVEDILOL 6.25 MG PO TABS: 6.2500 mg | ORAL_TABLET | Freq: Two times a day (BID) | ORAL | 2 refills | Status: DC

## 2023-02-18 MED ORDER — TRAZODONE HCL 50 MG PO TABS: 100.0000 mg | ORAL_TABLET | Freq: Every day | ORAL | 3 refills | Status: DC

## 2023-02-18 MED ORDER — SPIRONOLACTONE 25 MG PO TABS: 25.0000 mg | ORAL_TABLET | Freq: Every day | ORAL | 2 refills | Status: AC

## 2023-02-18 MED ORDER — APIXABAN 5 MG PO TABS: 5.0000 mg | ORAL_TABLET | Freq: Two times a day (BID) | ORAL | 2 refills | Status: DC

## 2023-02-18 NOTE — Telephone Encounter (Signed)
Needing medications he was taking at rehab refilled.

## 2023-02-19 DIAGNOSIS — F1220 Cannabis dependence, uncomplicated: Secondary | ICD-10-CM | POA: Diagnosis not present

## 2023-02-19 DIAGNOSIS — F1020 Alcohol dependence, uncomplicated: Secondary | ICD-10-CM | POA: Diagnosis not present

## 2023-02-19 DIAGNOSIS — F1520 Other stimulant dependence, uncomplicated: Secondary | ICD-10-CM | POA: Diagnosis not present

## 2023-02-20 DIAGNOSIS — F1020 Alcohol dependence, uncomplicated: Secondary | ICD-10-CM | POA: Diagnosis not present

## 2023-02-20 DIAGNOSIS — F1520 Other stimulant dependence, uncomplicated: Secondary | ICD-10-CM | POA: Diagnosis not present

## 2023-02-20 DIAGNOSIS — F1220 Cannabis dependence, uncomplicated: Secondary | ICD-10-CM | POA: Diagnosis not present

## 2023-02-21 ENCOUNTER — Ambulatory Visit (HOSPITAL_COMMUNITY)
Admission: RE | Admit: 2023-02-21 | Discharge: 2023-02-21 | Disposition: A | Discharge status: Home / Self Care | Payer: BC Managed Care – PPO | Source: Ambulatory Visit | Attending: Internal Medicine | Admitting: Internal Medicine

## 2023-02-21 DIAGNOSIS — I10 Essential (primary) hypertension: Secondary | ICD-10-CM | POA: Diagnosis not present

## 2023-02-21 DIAGNOSIS — F1020 Alcohol dependence, uncomplicated: Secondary | ICD-10-CM | POA: Diagnosis not present

## 2023-02-21 DIAGNOSIS — F1220 Cannabis dependence, uncomplicated: Secondary | ICD-10-CM | POA: Diagnosis not present

## 2023-02-21 DIAGNOSIS — F1520 Other stimulant dependence, uncomplicated: Secondary | ICD-10-CM | POA: Diagnosis not present

## 2023-02-21 LAB — BASIC METABOLIC PANEL
Anion gap: 13 (ref 5–15)
BUN: 13 mg/dL (ref 6–20)
CO2: 22 mmol/L (ref 22–32)
Calcium: 9.6 mg/dL (ref 8.9–10.3)
Chloride: 103 mmol/L (ref 98–111)
Creatinine, Ser: 1.07 mg/dL (ref 0.61–1.24)
GFR, Estimated: >60 mL/min (ref >60)
Glucose, Bld: 106 mg/dL — ABNORMAL HIGH (ref 70–99)
Potassium: 4 mmol/L (ref 3.5–5.1)
Sodium: 138 mmol/L (ref 135–145)

## 2023-02-22 DIAGNOSIS — F1220 Cannabis dependence, uncomplicated: Secondary | ICD-10-CM | POA: Diagnosis not present

## 2023-02-22 DIAGNOSIS — F1520 Other stimulant dependence, uncomplicated: Secondary | ICD-10-CM | POA: Diagnosis not present

## 2023-02-22 DIAGNOSIS — F1020 Alcohol dependence, uncomplicated: Secondary | ICD-10-CM | POA: Diagnosis not present

## 2023-02-24 DIAGNOSIS — J069 Acute upper respiratory infection, unspecified: Secondary | ICD-10-CM | POA: Diagnosis not present

## 2023-02-24 DIAGNOSIS — R051 Acute cough: Secondary | ICD-10-CM | POA: Diagnosis not present

## 2023-02-25 DIAGNOSIS — F1020 Alcohol dependence, uncomplicated: Secondary | ICD-10-CM | POA: Diagnosis not present

## 2023-02-25 DIAGNOSIS — F1220 Cannabis dependence, uncomplicated: Secondary | ICD-10-CM | POA: Diagnosis not present

## 2023-02-25 DIAGNOSIS — F1520 Other stimulant dependence, uncomplicated: Secondary | ICD-10-CM | POA: Diagnosis not present

## 2023-02-26 ENCOUNTER — Telehealth: Payer: Self-pay | Admitting: *Deleted

## 2023-02-26 DIAGNOSIS — F1220 Cannabis dependence, uncomplicated: Secondary | ICD-10-CM | POA: Diagnosis not present

## 2023-02-26 DIAGNOSIS — F1520 Other stimulant dependence, uncomplicated: Secondary | ICD-10-CM | POA: Diagnosis not present

## 2023-02-26 DIAGNOSIS — F1020 Alcohol dependence, uncomplicated: Secondary | ICD-10-CM | POA: Diagnosis not present

## 2023-02-26 NOTE — Telephone Encounter (Signed)
Joel [name],   You saw this patient on 02/11/2023. It looks like you want to repeat echo and it is scheduled for 04/16/2023 Will you please comment on medical clearance for extraction of 4 wisdom teeth under IV sedation? Will he need to wait to undergo procedure until after echo?   Please route your response to P CV DIV Preop. I will communicate with requesting office once you have given recommendations.   Thank you!  Joel Levering, NP

## 2023-02-26 NOTE — Telephone Encounter (Signed)
Please advise holding Eliquis prior to extraction of 4 wisdom teeth.   Thank you!  DW

## 2023-02-26 NOTE — Telephone Encounter (Signed)
   Pre-operative Risk Assessment    Patient Name: Joel [name]  DOB: 1995/11/19 MRN: [id number]   DATE OF LAST VISIT: 02/11/23 JESSICA MILFORD, FNP DATE OF NEXT VISIT: 04/16/23 DR. McLEAN   Request for Surgical Clearance    Procedure:  Dental Extraction - Amount of Teeth to be Pulled:  ALL 4 WISDOM TEETH FOR EXTRACTION-SURGICAL  Date of Surgery:  Clearance TBD                                 Surgeon:  DR. Graylon Gunning, DDS Surgeon's Group or Practice Name:  [facility] Phone number:  [phone] Fax number:  [phone]   Type of Clearance Requested:   - Medical  - Pharmacy:  Hold Apixaban (Eliquis) x 1-3 DAYS PRIOR   Type of Anesthesia:   IV SEDATION   Additional requests/questions:    Elpidio Anis   02/26/2023, 10:50 AM

## 2023-02-27 DIAGNOSIS — F1520 Other stimulant dependence, uncomplicated: Secondary | ICD-10-CM | POA: Diagnosis not present

## 2023-02-27 DIAGNOSIS — F1220 Cannabis dependence, uncomplicated: Secondary | ICD-10-CM | POA: Diagnosis not present

## 2023-02-27 DIAGNOSIS — F1020 Alcohol dependence, uncomplicated: Secondary | ICD-10-CM | POA: Diagnosis not present

## 2023-02-27 NOTE — Telephone Encounter (Signed)
   Name: Joel [name]  DOB: March 09, 1996  MRN: [id number]  Primary Cardiologist: None  Chart reviewed as part of pre-operative protocol coverage. Because of Joel [name]'s past medical history and time since last visit, he will require a follow-up in-office visit in order to better assess preoperative cardiovascular risk.  Patient has an office visit scheduled for Echo and with Dr. [name] on 04/16/2023. Appointment notes have been updated to reflect need for pre-op evaluation.  Per [name], FNP "Would  hold off on tooth extraction with IV sedation until after his echo. If EF improved, OK to go ahead with extraction with IV sedation. He is moderate risk from CV standpoint." Therefore, will defer to Dr. [name] at the time of follow up visit.   Pre-op covering staff:  - Please contact requesting surgeon's office via preferred method (i.e, phone, fax) to inform them of need for appointment prior to surgery.  This message will also be routed to pharmacy pool for input on holding Eliquis as requested below so that this information is available to the clearing provider at time of patient's appointment.   Carlos Levering, NP  02/27/2023, 8:10 AM

## 2023-02-27 NOTE — Telephone Encounter (Signed)
Will route recommendations to the requesting surgeon's office.

## 2023-02-27 NOTE — Progress Notes (Unsigned)
Subjective:  Patient ID: Joel [name], male    DOB: 1995-09-22  Age: 27 y.o. MRN: [id number]  No chief complaint on file.   HPI  Patient present today for a 4 week follow up, patient was seen on 01/31/2023 for ADHD and his Strattera 25 mg was increased to 40 mg daily.       01/31/2023    1:54 PM 01/29/2022    1:39 PM 03/06/2021    8:24 PM 10/05/2020    2:03 PM  Depression screen PHQ 2/9  Decreased Interest 0 1 0 0  Down, Depressed, Hopeless 0 0 0 0  PHQ - 2 Score 0 1 0 0  Altered sleeping 0 1    Tired, decreased energy 0 1    Change in appetite 0 2    Feeling bad or failure about yourself  0 1    Trouble concentrating 1 3    Moving slowly or fidgety/restless 0 3    Suicidal thoughts 0 0    PHQ-9 Score 1 12    Difficult doing work/chores Not difficult at all Somewhat difficult          01/31/2023    1:54 PM  Fall Risk   Falls in the past year? 0  Number falls in past yr: 0  Injury with Fall? 0  Risk for fall due to : No Fall Risks    Patient Care Team: Langley Gauss, [place] as PCP - General (Physician Assistant) Laurey Morale, MD as PCP - Advanced Heart Failure (Cardiology)   Review of Systems  Current Outpatient Medications on File Prior to Visit  Medication Sig Dispense Refill   apixaban (ELIQUIS) 5 MG TABS tablet Take 1 tablet (5 mg total) by mouth 2 (two) times daily. 60 tablet 2   atomoxetine (STRATTERA) 40 MG capsule Take 1 capsule (40 mg total) by mouth daily. 30 capsule 0   atorvastatin (LIPITOR) 10 MG tablet Take 1 tablet (10 mg total) by mouth daily. 90 tablet 3   buPROPion (WELLBUTRIN XL) 300 MG 24 hr tablet Take 300 mg by mouth daily.     carvedilol (COREG) 6.25 MG tablet Take 1 tablet (6.25 mg total) by mouth 2 (two) times daily with a meal. 60 tablet 2   dapagliflozin propanediol (FARXIGA) 10 MG TABS tablet Take 1 tablet (10 mg total) by mouth daily. 30 tablet 2   digoxin (LANOXIN) 0.125 MG tablet Take 1 tablet (0.125 mg total) by mouth daily. 30  tablet 2   potassium chloride SA (KLOR-CON M) 20 MEQ tablet Take 2 tablets (40 mEq total) by mouth daily. 60 tablet 2   sacubitril-valsartan (ENTRESTO) 49-51 MG Take 1 tablet by mouth 2 (two) times daily. 60 tablet 2   spironolactone (ALDACTONE) 25 MG tablet Take 1 tablet (25 mg total) by mouth daily. 30 tablet 2   torsemide (DEMADEX) 20 MG tablet Take 2 tablets (40 mg total) by mouth 2 (two) times daily. 120 tablet 2   traZODone (DESYREL) 50 MG tablet Take 2 tablets (100 mg total) by mouth at bedtime. 180 tablet 3   No current facility-administered medications on file prior to visit.   Past Medical History:  Diagnosis Date   Anxiety    Cerebral embolism with cerebral infarction 04/28/2020   Chronic systolic CHF 04/24/2020   Echo at [facility] 04/2020: EF < 20, mild to mod MR, LV thrombus   Depression    Dilated cardiomyopathy (HCC)    History of COVID-19  Hypertension    LV (left ventricular) mural thrombus    Past Surgical History:  Procedure Laterality Date   TONSILLECTOMY      Family History  Problem Relation Age of Onset   Diabetes Mother    Diabetes Paternal Grandfather    Heart failure Neg Hx    Social History   Socioeconomic History   Marital status: Single    Spouse name: Not on file   Number of children: Not on file   Years of education: Not on file   Highest education level: Not on file  Occupational History   Occupation: Full time/Plumbing  Tobacco Use   Smoking status: Former    Types: E-cigarettes   Smokeless tobacco: Current    Types: Chew   Tobacco comments:    quit around June 2021  Vaping Use   Vaping status: Every Day   Substances: Nicotine  Substance and Sexual Activity   Alcohol use: Not Currently    Alcohol/week: 7.0 standard drinks of alcohol    Types: 7 Cans of beer per week   Drug use: Not Currently    Types: Marijuana, Methamphetamines   Sexual activity: Yes    Partners: Female    Birth control/protection: Condom  Other Topics  Concern   Not on file  Social History Narrative   Lives with grandmother, grandfather and sister   Right Handed   Drinks rarely caffeine   Social Determinants of Health   Financial Resource Strain: Low Risk  (01/31/2023)   Overall Financial Resource Strain (CARDIA)    Difficulty of Paying Living Expenses: Not hard at all  Food Insecurity: No Food Insecurity (12/17/2022)   Hunger Vital Sign    Worried About Running Out of Food in the Last Year: Never true    Ran Out of Food in the Last Year: Never true  Transportation Needs: No Transportation Needs (12/17/2022)   PRAPARE - Administrator, Civil Service (Medical): No    Lack of Transportation (Non-Medical): No  Physical Activity: Inactive (01/31/2023)   Exercise Vital Sign    Days of Exercise per Week: 0 days    Minutes of Exercise per Session: 0 min  Stress: No Stress Concern Present (01/31/2023)   [employer] of Occupational Health - Occupational Stress Questionnaire    Feeling of Stress : Not at all  Social Connections: Unknown (02/24/2023)   Received from [facility]   Social Network    Social Network: Not on file  Recent Concern: Social Connections - Moderately Isolated (01/31/2023)   Social Connection and Isolation Panel [NHANES]    Frequency of Communication with Friends and Family: More than three times a week    Frequency of Social Gatherings with Friends and Family: More than three times a week    Attends Religious Services: More than 4 times per year    Active Member of [employer] or Organizations: No    Attends Banker Meetings: Never    Marital Status: Never married    Objective:  There were no vitals taken for this visit.     02/11/2023    2:43 PM 01/31/2023    1:46 PM 12/21/2022   11:47 AM  BP/Weight  Systolic BP 114 118 120  Diastolic BP 80 70 76  Wt. (Lbs) 304.8 305   BMI 42.51 kg/m2 42.54 kg/m2     Physical Exam  Diabetic Foot Exam - Simple   No data filed      Lab Results   Component  Value Date   WBC 10.0 01/31/2023   HGB 16.9 01/31/2023   HCT 48.9 01/31/2023   PLT 239 01/31/2023   GLUCOSE 106 (H) 02/21/2023   CHOL 196 01/31/2023   TRIG 296 (H) 01/31/2023   HDL 28 (L) 01/31/2023   LDLCALC 116 (H) 01/31/2023   ALT 27 01/31/2023   AST 16 01/31/2023   NA 138 02/21/2023   K 4.0 02/21/2023   CL 103 02/21/2023   CREATININE 1.07 02/21/2023   BUN 13 02/21/2023   CO2 22 02/21/2023   TSH 3.700 01/31/2023   INR 1.1 12/17/2022   HGBA1C 5.7 (H) 01/31/2023      Assessment & Plan:    There are no diagnoses linked to this encounter.   No orders of the defined types were placed in this encounter.   No orders of the defined types were placed in this encounter.    Follow-up: No follow-ups on file.   I,Jacqua L Marsh,acting as a scribe for [employer], PA.,have documented all relevant documentation on the behalf of Langley Gauss, PA,as directed by  Langley Gauss, PA while in the presence of Langley Gauss, [place].   An After Visit Summary was printed and given to the patient.  Langley Gauss, [place] [facility] [phone]

## 2023-02-27 NOTE — Telephone Encounter (Signed)
Patient with diagnosis of LV apical thrombus on Eliquis for anticoagulation.    Procedure:  Dental Extraction - Amount of Teeth to be Pulled:  ALL 4 WISDOM TEETH FOR EXTRACTION-SURGICAL   Date of Surgery:  Clearance TBD     Most recent echo (12/2022) shows no indication of LV thrombus  CrCl > 100 Platelet count 239  Patient does not require pre-op antibiotics for dental procedure.  Per office protocol, patient can hold Eliquis for 2 days prior to procedure.   Patient will not need bridging with Lovenox (enoxaparin) around procedure.  **This guidance is not considered finalized until pre-operative APP has relayed final recommendations.**

## 2023-02-28 ENCOUNTER — Ambulatory Visit: Payer: BC Managed Care – PPO | Attending: Cardiology | Admitting: Pharmacist

## 2023-02-28 ENCOUNTER — Ambulatory Visit (INDEPENDENT_AMBULATORY_CARE_PROVIDER_SITE_OTHER): Payer: BC Managed Care – PPO | Admitting: Physician Assistant

## 2023-02-28 ENCOUNTER — Encounter: Payer: Self-pay | Admitting: Pharmacist

## 2023-02-28 ENCOUNTER — Telehealth: Payer: Self-pay | Admitting: Pharmacist

## 2023-02-28 ENCOUNTER — Encounter: Payer: Self-pay | Admitting: Physician Assistant

## 2023-02-28 VITALS — Ht 71.0 in | Wt 312.4 lb

## 2023-02-28 VITALS — BP 120/80 | HR 84 | Temp 97.5°F | Ht 71.0 in | Wt 300.0 lb

## 2023-02-28 DIAGNOSIS — F411 Generalized anxiety disorder: Secondary | ICD-10-CM | POA: Diagnosis not present

## 2023-02-28 DIAGNOSIS — I5022 Chronic systolic (congestive) heart failure: Secondary | ICD-10-CM

## 2023-02-28 DIAGNOSIS — E782 Mixed hyperlipidemia: Secondary | ICD-10-CM | POA: Diagnosis not present

## 2023-02-28 DIAGNOSIS — J302 Other seasonal allergic rhinitis: Secondary | ICD-10-CM | POA: Diagnosis not present

## 2023-02-28 DIAGNOSIS — F902 Attention-deficit hyperactivity disorder, combined type: Secondary | ICD-10-CM | POA: Diagnosis not present

## 2023-02-28 MED ORDER — ATOMOXETINE HCL 40 MG PO CAPS
40.0000 mg | ORAL_CAPSULE | Freq: Every day | ORAL | 0 refills | Status: AC
Start: 2023-02-28 — End: ?

## 2023-02-28 MED ORDER — BUPROPION HCL ER (XL) 300 MG PO TB24
300.0000 mg | ORAL_TABLET | Freq: Every day | ORAL | 3 refills | Status: AC
Start: 2023-02-28 — End: ?

## 2023-02-28 MED ORDER — FEXOFENADINE HCL 60 MG PO TABS
60.0000 mg | ORAL_TABLET | Freq: Every day | ORAL | 3 refills | Status: DC | PRN
Start: 2023-02-28 — End: 2023-06-03

## 2023-02-28 NOTE — Telephone Encounter (Signed)
PA for [facility] submitted. Key: [id number]

## 2023-02-28 NOTE — Assessment & Plan Note (Signed)
Restarted his fexofenadine 60mg  Will let us know if he continues to have issues or concerns.

## 2023-02-28 NOTE — Assessment & Plan Note (Addendum)
Well controlled.  No changes to medicines. Strattera to 40mg   Experiencing some xerostomia, but states that it is not a major concern.

## 2023-02-28 NOTE — Progress Notes (Signed)
Patient ID: Joel [name]                 DOB: 09-15-1995                    MRN: [id number]     HPI: Joel [name] is a 27 y.o. male patient referred to pharmacy clinic by Joel [name] to initiate GLP1-RA therapy. PMH is significant for CHF, mural thrombus, cerebral embolism, HTN, ADHD, history of substance abuse, and obesity. Most recent BMI 41.86.  Patient presents today with his sister. Patient is quiet, sister asks most questions.   Last LVEF showed ~15%. Is able to exercise but is limited by cardiac ability.   Continues to drink soda and other sugary beverages. Last A1c 5.7%  Labs: Lab Results  Component Value Date   HGBA1C 5.7 (H) 01/31/2023    Wt Readings from Last 1 Encounters:  02/28/23 300 lb (136.1 kg)    BP Readings from Last 1 Encounters:  02/28/23 120/80   Pulse Readings from Last 1 Encounters:  02/28/23 84       Component Value Date/Time   CHOL 196 01/31/2023 1445   TRIG 296 (H) 01/31/2023 1445   HDL 28 (L) 01/31/2023 1445   CHOLHDL 7.0 (H) 01/31/2023 1445   CHOLHDL 3.1 06/29/2020 1256   VLDL 15 06/29/2020 1256   LDLCALC 116 (H) 01/31/2023 1445    Past Medical History:  Diagnosis Date   Anxiety    Cerebral embolism with cerebral infarction 04/28/2020   Chronic systolic CHF 04/24/2020   Echo at [facility] 04/2020: EF < 20, mild to mod MR, LV thrombus   Depression    Dilated cardiomyopathy (HCC)    History of COVID-19    Hypertension    LV (left ventricular) mural thrombus     Current Outpatient Medications on File Prior to Visit  Medication Sig Dispense Refill   apixaban (ELIQUIS) 5 MG TABS tablet Take 1 tablet (5 mg total) by mouth 2 (two) times daily. 60 tablet 2   atomoxetine (STRATTERA) 40 MG capsule Take 1 capsule (40 mg total) by mouth daily. 30 capsule 0   atorvastatin (LIPITOR) 10 MG tablet Take 1 tablet (10 mg total) by mouth daily. 90 tablet 3   azelastine (ASTELIN) 0.1 % nasal spray one spray by Both Nostrils route  2 (two) times daily. Use in each nostril as directed     benzonatate (TESSALON) 200 MG capsule Take by mouth.     buPROPion (WELLBUTRIN XL) 300 MG 24 hr tablet Take 1 tablet (300 mg total) by mouth daily. 90 tablet 3   carvedilol (COREG) 6.25 MG tablet Take 1 tablet (6.25 mg total) by mouth 2 (two) times daily with a meal. 60 tablet 2   dapagliflozin propanediol (FARXIGA) 10 MG TABS tablet Take 1 tablet (10 mg total) by mouth daily. 30 tablet 2   digoxin (LANOXIN) 0.125 MG tablet Take 1 tablet (0.125 mg total) by mouth daily. 30 tablet 2   fexofenadine (ALLEGRA) 60 MG tablet Take 1 tablet (60 mg total) by mouth daily as needed for allergies or rhinitis. 30 tablet 3   fluticasone (FLONASE) 50 MCG/ACT nasal spray SHAKE LIQUID AND USE 1 SPRAY IN EACH NOSTRIL DAILY     potassium chloride SA (KLOR-CON M) 20 MEQ tablet Take 2 tablets (40 mEq total) by mouth daily. 60 tablet 2   promethazine-dextromethorphan (PROMETHAZINE-DM) 6.25-15 MG/5ML syrup Take by mouth.     sacubitril-valsartan (ENTRESTO) 49-51 MG Take  1 tablet by mouth 2 (two) times daily. 60 tablet 2   spironolactone (ALDACTONE) 25 MG tablet Take 1 tablet (25 mg total) by mouth daily. 30 tablet 2   torsemide (DEMADEX) 20 MG tablet Take 2 tablets (40 mg total) by mouth 2 (two) times daily. 120 tablet 2   traZODone (DESYREL) 50 MG tablet Take 2 tablets (100 mg total) by mouth at bedtime. 180 tablet 3   VENTOLIN HFA 108 (90 Base) MCG/ACT inhaler Inhale into the lungs.     No current facility-administered medications on file prior to visit.    No Known Allergies   Assessment/Plan:  1. Weight loss/CVA/CHF - Patient BMI today 43.59 kg/m2 placing patient into severe obesity category. Would benefit from GLP1a due to obesity, CHF, and history of CVA.  Using [employer] and [employer], educated patient on mechansim of action, storage, site selection, and administration. Patient able to demonstrate in room. Confirmed patient has no personal or  family history of medullary thyroid carcinoma (MTC) or Multiple Endocrine Neoplasia syndrome type 2 (MEN 2). I  Advised patient on common side effects including nausea, diarrhea, dyspepsia, decreased appetite, and fatigue. Counseled patient on reducing meal size and how to titrate medication to minimize side effects. Counseled patient to call if intolerable side effects or if experiencing dehydration, abdominal pain, or dizziness. Patient will adhere to dietary modifications and will target at least 150 minutes of moderate intensity exercise weekly.   Will complete PA and contact patient with results  Laural Golden, PharmD, BCACP, CDCES, CPP [address], [address] [place], [place], [address] Phone: [phone], Fax: [phone]

## 2023-02-28 NOTE — Patient Instructions (Addendum)
It was nice meeting you today  The medications we discussed today are called Wegovy and Zepbound, both are once weekly injections  I will complete the prior authorizations for both and let you know their response  I place a referral to [facility] for you  Please message me with any questions  Laural Golden, PharmD, BCACP, CDCES, CPP [address], [address] [place], [place], [address] Phone: [phone], Fax: [phone]

## 2023-02-28 NOTE — Assessment & Plan Note (Signed)
Controlled Continue taking the Wellbutrin 300mg  as directed

## 2023-03-01 ENCOUNTER — Other Ambulatory Visit: Payer: Self-pay | Admitting: Physician Assistant

## 2023-03-01 DIAGNOSIS — F1020 Alcohol dependence, uncomplicated: Secondary | ICD-10-CM | POA: Diagnosis not present

## 2023-03-01 DIAGNOSIS — F1220 Cannabis dependence, uncomplicated: Secondary | ICD-10-CM | POA: Diagnosis not present

## 2023-03-01 DIAGNOSIS — F1520 Other stimulant dependence, uncomplicated: Secondary | ICD-10-CM | POA: Diagnosis not present

## 2023-03-01 DIAGNOSIS — F902 Attention-deficit hyperactivity disorder, combined type: Secondary | ICD-10-CM

## 2023-03-04 DIAGNOSIS — F1520 Other stimulant dependence, uncomplicated: Secondary | ICD-10-CM | POA: Diagnosis not present

## 2023-03-04 DIAGNOSIS — F1220 Cannabis dependence, uncomplicated: Secondary | ICD-10-CM | POA: Diagnosis not present

## 2023-03-04 DIAGNOSIS — F1020 Alcohol dependence, uncomplicated: Secondary | ICD-10-CM | POA: Diagnosis not present

## 2023-03-05 DIAGNOSIS — F1220 Cannabis dependence, uncomplicated: Secondary | ICD-10-CM | POA: Diagnosis not present

## 2023-03-05 DIAGNOSIS — F1520 Other stimulant dependence, uncomplicated: Secondary | ICD-10-CM | POA: Diagnosis not present

## 2023-03-05 DIAGNOSIS — F1020 Alcohol dependence, uncomplicated: Secondary | ICD-10-CM | POA: Diagnosis not present

## 2023-03-06 DIAGNOSIS — F1520 Other stimulant dependence, uncomplicated: Secondary | ICD-10-CM | POA: Diagnosis not present

## 2023-03-06 DIAGNOSIS — F1220 Cannabis dependence, uncomplicated: Secondary | ICD-10-CM | POA: Diagnosis not present

## 2023-03-06 DIAGNOSIS — F1020 Alcohol dependence, uncomplicated: Secondary | ICD-10-CM | POA: Diagnosis not present

## 2023-03-08 DIAGNOSIS — F1220 Cannabis dependence, uncomplicated: Secondary | ICD-10-CM | POA: Diagnosis not present

## 2023-03-08 DIAGNOSIS — F1520 Other stimulant dependence, uncomplicated: Secondary | ICD-10-CM | POA: Diagnosis not present

## 2023-03-08 DIAGNOSIS — F1020 Alcohol dependence, uncomplicated: Secondary | ICD-10-CM | POA: Diagnosis not present

## 2023-03-10 ENCOUNTER — Other Ambulatory Visit: Payer: Self-pay | Admitting: Physician Assistant

## 2023-03-11 ENCOUNTER — Other Ambulatory Visit: Payer: Self-pay

## 2023-03-11 DIAGNOSIS — F1020 Alcohol dependence, uncomplicated: Secondary | ICD-10-CM | POA: Diagnosis not present

## 2023-03-11 DIAGNOSIS — F1520 Other stimulant dependence, uncomplicated: Secondary | ICD-10-CM | POA: Diagnosis not present

## 2023-03-11 DIAGNOSIS — F1220 Cannabis dependence, uncomplicated: Secondary | ICD-10-CM | POA: Diagnosis not present

## 2023-03-12 ENCOUNTER — Telehealth (HOSPITAL_COMMUNITY): Payer: Self-pay

## 2023-03-12 DIAGNOSIS — F1520 Other stimulant dependence, uncomplicated: Secondary | ICD-10-CM | POA: Diagnosis not present

## 2023-03-12 DIAGNOSIS — F1020 Alcohol dependence, uncomplicated: Secondary | ICD-10-CM | POA: Diagnosis not present

## 2023-03-12 DIAGNOSIS — F1220 Cannabis dependence, uncomplicated: Secondary | ICD-10-CM | POA: Diagnosis not present

## 2023-03-12 NOTE — Telephone Encounter (Signed)
Received a fax requesting medical records from [facility]. Records were successfully faxed to: [phone] ,which was the number provided.. Medical request form will be scanned into patients chart.

## 2023-03-13 ENCOUNTER — Other Ambulatory Visit (HOSPITAL_BASED_OUTPATIENT_CLINIC_OR_DEPARTMENT_OTHER): Payer: Self-pay

## 2023-03-13 DIAGNOSIS — F1220 Cannabis dependence, uncomplicated: Secondary | ICD-10-CM | POA: Diagnosis not present

## 2023-03-13 DIAGNOSIS — F1020 Alcohol dependence, uncomplicated: Secondary | ICD-10-CM | POA: Diagnosis not present

## 2023-03-13 DIAGNOSIS — F1520 Other stimulant dependence, uncomplicated: Secondary | ICD-10-CM | POA: Diagnosis not present

## 2023-03-15 ENCOUNTER — Other Ambulatory Visit (HOSPITAL_BASED_OUTPATIENT_CLINIC_OR_DEPARTMENT_OTHER): Payer: Self-pay

## 2023-03-15 MED ORDER — VENTOLIN HFA 108 (90 BASE) MCG/ACT IN AERS
1.0000 | INHALATION_SPRAY | RESPIRATORY_TRACT | 3 refills | Status: AC | PRN
  Filled 2023-03-15: qty 18, 17d supply, fill #0

## 2023-03-15 MED ORDER — PROMETHAZINE-DM 6.25-15 MG/5ML PO SYRP
2.5000 mL | ORAL_SOLUTION | Freq: Four times a day (QID) | ORAL | 0 refills | Status: DC | PRN
  Filled 2023-03-15: qty 118, 12d supply, fill #0

## 2023-03-19 ENCOUNTER — Encounter: Payer: Self-pay | Admitting: Pharmacist

## 2023-03-25 ENCOUNTER — Other Ambulatory Visit (HOSPITAL_BASED_OUTPATIENT_CLINIC_OR_DEPARTMENT_OTHER): Payer: Self-pay

## 2023-03-26 DIAGNOSIS — F1020 Alcohol dependence, uncomplicated: Secondary | ICD-10-CM | POA: Diagnosis not present

## 2023-03-26 DIAGNOSIS — F1220 Cannabis dependence, uncomplicated: Secondary | ICD-10-CM | POA: Diagnosis not present

## 2023-03-26 DIAGNOSIS — F1520 Other stimulant dependence, uncomplicated: Secondary | ICD-10-CM | POA: Diagnosis not present

## 2023-03-28 ENCOUNTER — Other Ambulatory Visit: Payer: Self-pay | Admitting: Physician Assistant

## 2023-03-28 DIAGNOSIS — F902 Attention-deficit hyperactivity disorder, combined type: Secondary | ICD-10-CM

## 2023-04-01 ENCOUNTER — Other Ambulatory Visit: Payer: Self-pay

## 2023-04-01 DIAGNOSIS — F902 Attention-deficit hyperactivity disorder, combined type: Secondary | ICD-10-CM

## 2023-04-02 MED ORDER — ATOMOXETINE HCL 40 MG PO CAPS
40.0000 mg | ORAL_CAPSULE | Freq: Every day | ORAL | 0 refills | Status: DC
Start: 2023-04-02 — End: 2023-04-29

## 2023-04-09 ENCOUNTER — Encounter (HOSPITAL_COMMUNITY): Payer: BC Managed Care – PPO | Admitting: Cardiology

## 2023-04-09 ENCOUNTER — Other Ambulatory Visit (HOSPITAL_COMMUNITY): Payer: BC Managed Care – PPO

## 2023-04-16 ENCOUNTER — Encounter (HOSPITAL_COMMUNITY): Payer: Self-pay | Admitting: Cardiology

## 2023-04-16 ENCOUNTER — Ambulatory Visit (HOSPITAL_BASED_OUTPATIENT_CLINIC_OR_DEPARTMENT_OTHER)
Admission: RE | Admit: 2023-04-16 | Discharge: 2023-04-16 | Disposition: A | Discharge status: Home / Self Care | Payer: BC Managed Care – PPO | Source: Ambulatory Visit | Attending: Cardiology | Admitting: Cardiology

## 2023-04-16 ENCOUNTER — Ambulatory Visit (HOSPITAL_COMMUNITY)
Admission: RE | Admit: 2023-04-16 | Discharge: 2023-04-16 | Disposition: A | Discharge status: Home / Self Care | Payer: BC Managed Care – PPO | Source: Ambulatory Visit | Attending: Family Medicine | Admitting: Family Medicine

## 2023-04-16 VITALS — BP 120/70 | HR 71 | Wt 314.4 lb

## 2023-04-16 DIAGNOSIS — I5022 Chronic systolic (congestive) heart failure: Secondary | ICD-10-CM | POA: Diagnosis not present

## 2023-04-16 DIAGNOSIS — E669 Obesity, unspecified: Secondary | ICD-10-CM | POA: Insufficient documentation

## 2023-04-16 DIAGNOSIS — Z7901 Long term (current) use of anticoagulants: Secondary | ICD-10-CM | POA: Insufficient documentation

## 2023-04-16 DIAGNOSIS — Z6841 Body Mass Index (BMI) 40.0 and over, adult: Secondary | ICD-10-CM | POA: Insufficient documentation

## 2023-04-16 DIAGNOSIS — E785 Hyperlipidemia, unspecified: Secondary | ICD-10-CM | POA: Diagnosis not present

## 2023-04-16 DIAGNOSIS — I110 Hypertensive heart disease with heart failure: Secondary | ICD-10-CM | POA: Insufficient documentation

## 2023-04-16 DIAGNOSIS — I428 Other cardiomyopathies: Secondary | ICD-10-CM | POA: Diagnosis not present

## 2023-04-16 DIAGNOSIS — R9431 Abnormal electrocardiogram [ECG] [EKG]: Secondary | ICD-10-CM | POA: Insufficient documentation

## 2023-04-16 DIAGNOSIS — Z79899 Other long term (current) drug therapy: Secondary | ICD-10-CM | POA: Insufficient documentation

## 2023-04-16 DIAGNOSIS — Z8673 Personal history of transient ischemic attack (TIA), and cerebral infarction without residual deficits: Secondary | ICD-10-CM | POA: Insufficient documentation

## 2023-04-16 DIAGNOSIS — Z8616 Personal history of COVID-19: Secondary | ICD-10-CM | POA: Diagnosis not present

## 2023-04-16 DIAGNOSIS — F419 Anxiety disorder, unspecified: Secondary | ICD-10-CM | POA: Insufficient documentation

## 2023-04-16 DIAGNOSIS — F1511 Other stimulant abuse, in remission: Secondary | ICD-10-CM | POA: Diagnosis not present

## 2023-04-16 DIAGNOSIS — Z86718 Personal history of other venous thrombosis and embolism: Secondary | ICD-10-CM | POA: Diagnosis not present

## 2023-04-16 DIAGNOSIS — I10 Essential (primary) hypertension: Secondary | ICD-10-CM | POA: Diagnosis not present

## 2023-04-16 DIAGNOSIS — F17290 Nicotine dependence, other tobacco product, uncomplicated: Secondary | ICD-10-CM | POA: Diagnosis not present

## 2023-04-16 LAB — CBC
HCT: 40.4 % (ref 39.0–52.0)
Hemoglobin: 13.8 g/dL (ref 13.0–17.0)
MCH: 30.7 pg (ref 26.0–34.0)
MCHC: 34.2 g/dL (ref 30.0–36.0)
MCV: 90 fL (ref 80.0–100.0)
Platelets: 224 10*3/uL (ref 150–400)
RBC: 4.49 MIL/uL (ref 4.22–5.81)
RDW: 12.4 % (ref 11.5–15.5)
WBC: 8 10*3/uL (ref 4.0–10.5)
nRBC: 0 % (ref 0.0–0.2)

## 2023-04-16 LAB — ECHOCARDIOGRAM COMPLETE
AR max vel: 2.57 cm2
AV Area VTI: 2.7 cm2
AV Area mean vel: 2.39 cm2
AV Mean grad: 4 mm[Hg]
AV Peak grad: 5.7 mm[Hg]
Ao pk vel: 1.19 m/s
Area-P 1/2: 3.43 cm2
S' Lateral: 5.3 cm

## 2023-04-16 LAB — BASIC METABOLIC PANEL
Anion gap: 9 (ref 5–15)
BUN: 9 mg/dL (ref 6–20)
CO2: 21 mmol/L — ABNORMAL LOW (ref 22–32)
Calcium: 9.3 mg/dL (ref 8.9–10.3)
Chloride: 106 mmol/L (ref 98–111)
Creatinine, Ser: 0.91 mg/dL (ref 0.61–1.24)
GFR, Estimated: >60 mL/min (ref >60)
Glucose, Bld: 90 mg/dL (ref 70–99)
Potassium: 3.9 mmol/L (ref 3.5–5.1)
Sodium: 136 mmol/L (ref 135–145)

## 2023-04-16 LAB — DIGOXIN LEVEL: Digoxin Level: <0.2 ng/mL — ABNORMAL LOW (ref 0.8–2.0)

## 2023-04-16 LAB — BRAIN NATRIURETIC PEPTIDE: B Natriuretic Peptide: 34.7 pg/mL (ref 0.0–100.0)

## 2023-04-16 MED ORDER — PERFLUTREN LIPID MICROSPHERE
1.0000 mL | INTRAVENOUS | Status: DC | PRN
  Administered 2023-04-16: 2 mL via INTRAVENOUS

## 2023-04-16 MED ORDER — ENTRESTO 97-103 MG PO TABS: 1.0000 | ORAL_TABLET | Freq: Two times a day (BID) | ORAL | 11 refills | Status: AC

## 2023-04-16 NOTE — Patient Instructions (Signed)
INCREASE Entresto to 97/103 mg Twice daily  Labs done today, your results will be available in MyChart, we will contact you for abnormal readings.  Repeat blood work in 2 weeks.  Please follow up with our heart failure pharmacist in 1 month  Your physician recommends that you schedule a follow-up appointment in: 3 months.  If you have any questions or concerns before your next appointment please send [place] a message through [place] or call our office at [phone].    TO LEAVE A MESSAGE FOR THE NURSE SELECT OPTION 2, PLEASE LEAVE A MESSAGE INCLUDING: YOUR NAME DATE OF BIRTH CALL BACK NUMBER REASON FOR CALL**this is important as we prioritize the call backs  YOU WILL RECEIVE A CALL BACK THE SAME DAY AS LONG AS YOU CALL BEFORE 4:00 PM  At the [facility], you and your health needs are our priority. As part of our continuing mission to provide you with exceptional heart care, we have created designated Provider Care Teams. These Care Teams include your primary Cardiologist (physician) and Advanced Practice Providers (APPs- Physician Assistants and Nurse Practitioners) who all work together to provide you with the care you need, when you need it.   You may see any of the following providers on your designated Care Team at your next follow up: Dr Arvilla Meres Dr [name] Dr. Dorthula Nettles Dr. Clearnce Hasten Amy Filbert Schilder, NP Robbie Lis, [place] [facility], [place] Brynda Peon, NP [place] Lee, NP Karle Plumber, PharmD   Please be sure to bring in all your medications bottles to every appointment.    Thank you for choosing [facility]

## 2023-04-16 NOTE — Progress Notes (Signed)
PCP: Langley Gauss, PA  Cardiology: Dr. Leotis Pain Theard is 27 y.o. male with a hx of hypertension, anxiety and prior COVID-19 infection in June in 2021 who presented to [facility] in 10/21 with shortness of breath and hemoptysis x 1 month.  An echocardiogram demonstrated EF < 20% and an LV thrombus.  A CT scan demonstrated multifocal pneumonia, no PE.  He was started on furosemide and Lovenox and transferred to [facility] for further evaluation and management.  He was started on milrinone with low output by co-ox and diuresed. Cardiac MRI showed severe LV dilation with diffuse hypokinesis, EF 15%, small LV apical thrombus, moderate RV dilation with EF 19%, LGE pattern concerning for myocarditis with diffuse patchy mid-wall LGE.  He was treated with ceftriaxone and doxycycline for PNA. He had a CVA with left-sided weakness and left facial numbness.  MRI showed multifocal bilateral infarcts consistent with cardio-embolism.  He was transitioned from Lovenox/warfarin to IV heparin gtt/warfarin.  Left-sided weakness resolved. He was titrated onto cardiac meds and eventually discharged home.   Echo in 3/22 showed EF 35% with global hypokinesis, mild LV dilation, mildly decreased RV systolic function. No LV thrombus.     Echo 9/22 showed EF 45% with mild LVH, diffuse hypokinesis, normal RV, no thrombus.   Echo 9/23 showed EF 40-45%, RV mildly reduced  Admitted 6/24 with acute on chronic CHF 2/2 to medication noncompliance and recurrence of substance abuse. Echo showed EF < 15%, RV normal. Diuresed with IV lasix, concern for low output with elevated lactic acid and low CO2. GDMT titrated.  After this admission, he went to an inpatient rehab facility in [place] and quit drinking and using amphetamines.   Echo was done today and reviewed, EF improved to 35-40% with normal RV.   Today he returns for HF follow up.  Weight is up about 10 lbs, but he has been working in [employer] and eating  out at [employer].  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  No significant exertional dyspnea.  No lightheadedness.  No orthopnea/PND.  No chest pain.  No episodes of lightheadedness.  He is staying off amphetamines and is not drinking.  ECG (personally reviewed): Nonspecific T wave changes.   Labs (10/21): K 4.9, creatinine 1.06 Labs (11/21): K 5, creatinine 0.96 Labs (12/21): digoxin 0.7, LDL 80, HDL 50, K 4.3, creatinine 4.09 Labs (7/22): K 4.7, creatinine 1.11 Labs (10/22): K 4.4, creatinine 0.80 Labs (5/23): K 4.7, creatinine 0.85 Labs (8/24): K 4.2, creatinine 1.03 => 1.07, LDL 116, hgb 16.9, BNP 36  PMH: 1. H/o COVID-19 PNA 6/21.  2. HTN 3. Anxiety 4. Chronic systolic CHF: Nonischemic cardiomyopathy.   - Echo (10/21): EF < 20%, LV thrombus.  - Cardiac MRI (10/21): Severe LV dilation with diffuse hypokinesis, EF 15%, small LV apical thrombus, moderate RV dilation with EF 19%, LGE pattern concerning for myocarditis, with diffuse patchy mid-wall LGE. - Echo (3/22): EF 35% with global hypokinesis, mild LV dilation, mildly decreased RV systolic function. No LV thrombus. - Echo (9/22): EF 45% with mild LVH, diffuse hypokinesis, normal RV, no thrombus.  - Echo (9/23): EF 40-45% with moderate LVH, global hypokinesis, RV mildly reduced, no thrombus - Echo (6/24): EF < 15, RV normal, no thrombus - Echo (10/24): EF 35-40%, normal RV 5. CVA: 10/21, suspect cardio-embolic.  6. LV apical thrombus 7. Sleep study negative.  8. Crystal meth abuse  Social History   Socioeconomic History   Marital  status: Single    Spouse name: Not on file   Number of children: Not on file   Years of education: Not on file   Highest education level: Not on file  Occupational History   Occupation: Full time/Plumbing  Tobacco Use   Smoking status: Former    Types: E-cigarettes   Smokeless tobacco: Current    Types: Chew   Tobacco comments:    quit around June 2021  Vaping Use    Vaping status: Every Day   Substances: Nicotine  Substance and Sexual Activity   Alcohol use: Not Currently    Alcohol/week: 7.0 standard drinks of alcohol    Types: 7 Cans of beer per week   Drug use: Not Currently    Types: Marijuana, Methamphetamines   Sexual activity: Yes    Partners: Female    Birth control/protection: Condom  Other Topics Concern   Not on file  Social History Narrative   Lives with grandmother, grandfather and sister   Right Handed   Drinks rarely caffeine   Social Determinants of Health   Financial Resource Strain: Low Risk  (01/31/2023)   Overall Financial Resource Strain (CARDIA)    Difficulty of Paying Living Expenses: Not hard at all  Food Insecurity: No Food Insecurity (12/17/2022)   Hunger Vital Sign    Worried About Running Out of Food in the Last Year: Never true    Ran Out of Food in the Last Year: Never true  Transportation Needs: No Transportation Needs (12/17/2022)   PRAPARE - Administrator, Civil Service (Medical): No    Lack of Transportation (Non-Medical): No  Physical Activity: Inactive (01/31/2023)   Exercise Vital Sign    Days of Exercise per Week: 0 days    Minutes of Exercise per Session: 0 min  Stress: No Stress Concern Present (01/31/2023)   [employer] of Occupational Health - Occupational Stress Questionnaire    Feeling of Stress : Not at all  Social Connections: Unknown (02/24/2023)   [facility]   Social Network    Social Network: Not on file  Recent Concern: Social Connections - Moderately Isolated (01/31/2023)   Social Connection and Isolation Panel [NHANES]    Frequency of Communication with Friends and Family: More than three times a week    Frequency of Social Gatherings with Friends and Family: More than three times a week    Attends Religious Services: More than 4 times per year    Active Member of [employer] or Organizations: No    Attends Banker Meetings: Never    Marital  Status: Never married  Intimate Partner Violence: Unknown (02/24/2023)   Received from [facility]   HITS    Physically Hurt: Not on file    Insult or Talk Down To: Not on file    Threaten Physical Harm: Not on file    Scream or Curse: Not on file   Family History  Problem Relation Age of Onset   Diabetes Mother    Diabetes Paternal Grandfather    Heart failure Neg Hx    ROS: All systems reviewed and negative except as per HPI.   Current Outpatient Medications  Medication Sig Dispense Refill   apixaban (ELIQUIS) 5 MG TABS tablet Take 1 tablet (5 mg total) by mouth 2 (two) times daily. 60 tablet 2   atomoxetine (STRATTERA) 40 MG capsule Take 1 capsule (40 mg total) by mouth daily. 30 capsule 0   atorvastatin (LIPITOR) 10 MG  tablet Take 1 tablet (10 mg total) by mouth daily. 90 tablet 3   azelastine (ASTELIN) 0.1 % nasal spray one spray by Both Nostrils route 2 (two) times daily. Use in each nostril as directed     buPROPion (WELLBUTRIN XL) 300 MG 24 hr tablet Take 1 tablet (300 mg total) by mouth daily. 90 tablet 3   carvedilol (COREG) 6.25 MG tablet Take 1 tablet (6.25 mg total) by mouth 2 (two) times daily with a meal. 60 tablet 2   dapagliflozin propanediol (FARXIGA) 10 MG TABS tablet Take 1 tablet (10 mg total) by mouth daily. 30 tablet 2   digoxin (LANOXIN) 0.125 MG tablet Take 1 tablet (0.125 mg total) by mouth daily. 30 tablet 2   fluticasone (FLONASE) 50 MCG/ACT nasal spray SHAKE LIQUID AND USE 1 SPRAY IN EACH NOSTRIL DAILY     potassium chloride SA (KLOR-CON M) 20 MEQ tablet Take 2 tablets (40 mEq total) by mouth daily. 60 tablet 2   sacubitril-valsartan (ENTRESTO) 97-103 MG Take 1 tablet by mouth 2 (two) times daily. 60 tablet 11   spironolactone (ALDACTONE) 25 MG tablet Take 1 tablet (25 mg total) by mouth daily. 30 tablet 2   torsemide (DEMADEX) 20 MG tablet Take 2 tablets (40 mg total) by mouth 2 (two) times daily. 120 tablet 2   traZODone (DESYREL) 50 MG tablet Take 2  tablets (100 mg total) by mouth at bedtime. 180 tablet 3   fexofenadine (ALLEGRA) 60 MG tablet Take 1 tablet (60 mg total) by mouth daily as needed for allergies or rhinitis. (Patient not taking: Reported on 04/16/2023) 30 tablet 3   VENTOLIN HFA 108 (90 Base) MCG/ACT inhaler Inhale 1-2 puffs into the lungs every 4 (four) hours as needed for wheezing or shortness of breath. (Patient not taking: Reported on 04/16/2023) 18 g 3   No current facility-administered medications for this encounter.   Wt Readings from Last 3 Encounters:  04/16/23 (!) 142.6 kg (314 lb 6.4 oz)  02/28/23 (!) 141.7 kg (312 lb 6.4 oz)  02/28/23 136.1 kg (300 lb)   BP 120/70   Pulse 71   Wt (!) 142.6 kg (314 lb 6.4 oz)   SpO2 97%   BMI 43.85 kg/m  General: NAD Neck: No JVD, no thyromegaly or thyroid nodule.  Lungs: Clear to auscultation bilaterally with normal respiratory effort. CV: Nondisplaced PMI.  Heart regular S1/S2, no S3/S4, no murmur.  No peripheral edema.  No carotid bruit.  Normal pedal pulses.  Abdomen: Soft, nontender, no hepatosplenomegaly, no distention.  Skin: Intact without lesions or rashes.  Neurologic: Alert and oriented x 3.  Psych: Normal affect. Extremities: No clubbing or cyanosis.  HEENT: Normal.   Assessment/Plan: 1. Chronic Systolic Heart Failure: NICM. Diagnosed post COVID infection in 2021. Echo 10/21 EF < 20% + LV thrombus. cMRI with LVEF 15%, RVEF 19%, LGE pattern concerning for myocarditis. Suspect most likely etiology for cardiomyopathy is viral myocarditis due to COVID-19 though methamphetamine use may have contributed as well. Echo in 3/22 showed improved EF to 35%. Echo 9/22 showed EF up to 45%. Echo in 6/24 in the setting of relapsed substance abuse and poor medication compliance showed EF 15%, RV normal.  He is now off drugs/ETOH and is taking his meds.  Echo today showed EF 35-40%, normal RV, improved. NYHA class I-II, not volume overloaded on exam.  - Continue torsemide 40 mg  bid.  BMET/BNP today.  - Continue digoxin 0.125 mg daily. Check digoxin level today.  -  Continue Coreg 6.25 mg bid.  - Increase Entresto to 97/103 bid.  BMET in 10 days.  - Continue spiro to 25 mg daily.  - Continue Farxiga 10 mg daily.  - EF is increasing and appears to be out of ICD range.  2. Hypertension: BP well controlled.  3. Polysubstance Use/ Stimulant Use Disorder: UDS + for amphetamines and THC during 6/24 admission.  Completed inpatient rehab in [facility].  He reports no ETOH/drugs now.  4. H/o LV Thrombus w/ cardioembolic CVA: Occurred in 2021. No LV thrombus seen on today's echo.  - Continue Eliquis 5 mg bid, check CBC today.  5. Hyperlipidemia: On atorvastatin.  - Check lipids next appt.  7. Obesity: Body mass index is 43.85 kg/m. - Insurance would not cover GLP-1 agonist.   Follow up in 1 month with HF pharmacist, followup 3 months with APP.   Joel [name]  04/16/2023

## 2023-04-19 ENCOUNTER — Encounter (HOSPITAL_COMMUNITY): Payer: Self-pay

## 2023-04-29 ENCOUNTER — Other Ambulatory Visit: Payer: Self-pay | Admitting: Physician Assistant

## 2023-04-29 DIAGNOSIS — F902 Attention-deficit hyperactivity disorder, combined type: Secondary | ICD-10-CM

## 2023-05-03 ENCOUNTER — Other Ambulatory Visit (HOSPITAL_COMMUNITY): Payer: BC Managed Care – PPO

## 2023-05-20 ENCOUNTER — Other Ambulatory Visit (HOSPITAL_COMMUNITY): Payer: BC Managed Care – PPO

## 2023-05-22 ENCOUNTER — Other Ambulatory Visit (HOSPITAL_COMMUNITY): Payer: Self-pay | Admitting: Family Medicine

## 2023-06-03 ENCOUNTER — Encounter: Payer: Self-pay | Admitting: Physician Assistant

## 2023-06-03 ENCOUNTER — Ambulatory Visit (INDEPENDENT_AMBULATORY_CARE_PROVIDER_SITE_OTHER): Payer: BC Managed Care – PPO | Admitting: Physician Assistant

## 2023-06-03 VITALS — BP 130/88 | HR 107 | Temp 97.5°F | Resp 14 | Ht 71.0 in | Wt 328.0 lb

## 2023-06-03 DIAGNOSIS — F902 Attention-deficit hyperactivity disorder, combined type: Secondary | ICD-10-CM

## 2023-06-03 DIAGNOSIS — I420 Dilated cardiomyopathy: Secondary | ICD-10-CM

## 2023-06-03 DIAGNOSIS — J302 Other seasonal allergic rhinitis: Secondary | ICD-10-CM

## 2023-06-03 DIAGNOSIS — J069 Acute upper respiratory infection, unspecified: Secondary | ICD-10-CM | POA: Insufficient documentation

## 2023-06-03 DIAGNOSIS — E782 Mixed hyperlipidemia: Secondary | ICD-10-CM | POA: Diagnosis not present

## 2023-06-03 LAB — POC COVID19 BINAXNOW: SARS Coronavirus 2 Ag: NEGATIVE

## 2023-06-03 LAB — POCT INFLUENZA A/B
Influenza A, POC: NEGATIVE
Influenza B, POC: NEGATIVE

## 2023-06-03 MED ORDER — ATOMOXETINE HCL 40 MG PO CAPS
40.0000 mg | ORAL_CAPSULE | Freq: Every day | ORAL | 0 refills | Status: DC
Start: 2023-06-03 — End: 2023-07-14

## 2023-06-03 MED ORDER — FEXOFENADINE HCL 180 MG PO TABS
180.0000 mg | ORAL_TABLET | Freq: Every day | ORAL | 3 refills | Status: AC
Start: 2023-06-03 — End: ?

## 2023-06-03 MED ORDER — DOXYCYCLINE HYCLATE 100 MG PO TABS: 100.0000 mg | ORAL_TABLET | Freq: Two times a day (BID) | ORAL | 0 refills | Status: DC

## 2023-06-03 NOTE — Assessment & Plan Note (Signed)
Acute onset of symptoms for 6 days with yellow sputum, chest congestion, and sinus congestion. No fever or headache. Possible exposure from sick family members. -Start Doxycycline 100mg  twice daily for 10 days. -Order chest X-ray to rule out pneumonia.

## 2023-06-03 NOTE — Assessment & Plan Note (Signed)
Chronic symptoms, possibly contributing to current upper respiratory symptoms. -Refill Allegra as needed.

## 2023-06-03 NOTE — Assessment & Plan Note (Signed)
On Digoxin, recent lab showed low levels. No recent cardiac complaints. -Continue Digoxin as prescribed. -Order labs for kidney, liver function, cholesterol, A1c, and Digoxin level.

## 2023-06-03 NOTE — Assessment & Plan Note (Signed)
Well controlled.  Continue to work on eating a healthy diet and exercise.  Labs drawn today.   No major side effects reported, and no issues with compliance. The current medical regimen is effective;  continue present plan with Lipitor 10mg , Coreg 6.25mg  Will adjust medication as needed depending on labs Lab Results  Component Value Date   LDLCALC 116 (H) 01/31/2023

## 2023-06-03 NOTE — Progress Notes (Signed)
Subjective:  Patient ID: Joel [name], male    DOB: 07-Oct-1995  Age: 27 y.o. MRN: [id number]  Chief Complaint  Patient presents with   ADHD   Sinusitis    HPI   Patient present today for ADHD medication, patient was seen on 02/28/2023 and his Strattera was increased to 40 mg daily.  Patient mentioned sinus pressure, cough, chest congestion since 6 days ago. He denies fever, chills, body ache. He has been taking mucinex.  Discussed the use of AI scribe software for clinical note transcription with the patient, who gave verbal consent to proceed.  History of Present Illness   Joel [name], a 27 year old male with a history of ADHD and cardiac issues, presents with symptoms of an upper respiratory infection that have been ongoing for approximately six days. He reports chest congestion and yellow nasal discharge, but denies experiencing any fevers or headaches. The onset of symptoms coincided with a drop in temperature, and he believes he may have contracted the infection from his sister, who was sick before him. His symptoms have worsened over time, initially presenting in his sinuses and now manifesting as chest congestion. He has attempted to alleviate his symptoms with Mucinex and Allegra, with limited success.  In terms of his chronic conditions, Joel [name] reports that his ADHD is well-managed with Strattera and he does not wish to change this medication. He also has a history of cardiac issues and is currently taking digoxin. He recently missed an appointment with a pharmacist due to starting a new job.          06/03/2023   10:20 AM 01/31/2023    1:54 PM 01/29/2022    1:39 PM 03/06/2021    8:24 PM 10/05/2020    2:03 PM  Depression screen PHQ 2/9  Decreased Interest 0 0 1 0 0  Down, Depressed, Hopeless 0 0 0 0 0  PHQ - 2 Score 0 0 1 0 0  Altered sleeping 0 0 1    Tired, decreased energy 0 0 1    Change in appetite 0 0 2    Feeling bad or failure about yourself  0 0 1    Trouble  concentrating 0 1 3    Moving slowly or fidgety/restless 0 0 3    Suicidal thoughts 0 0 0    PHQ-9 Score 0 1 12    Difficult doing work/chores Not difficult at all Not difficult at all Somewhat difficult          06/03/2023   10:20 AM  Fall Risk   Falls in the past year? 0  Number falls in past yr: 0  Injury with Fall? 0  Risk for fall due to : No Fall Risks  Follow up Education provided    Patient Care Team: Langley Gauss, [place] as PCP - General (Physician Assistant) Laurey Morale, MD as PCP - Advanced Heart Failure (Cardiology)   Review of Systems  Constitutional:  Positive for fatigue. Negative for chills and fever.  HENT:  Positive for congestion, sinus pressure and sinus pain. Negative for ear pain and sore throat.   Respiratory:  Positive for cough, shortness of breath and wheezing.   Cardiovascular:  Negative for chest pain.  Musculoskeletal:  Negative for myalgias.  Neurological:  Negative for headaches.    Current Outpatient Medications on File Prior to Visit  Medication Sig Dispense Refill   apixaban (ELIQUIS) 5 MG TABS tablet Take 1 tablet (5 mg total) by mouth 2 (two) times  daily. 60 tablet 2   atorvastatin (LIPITOR) 10 MG tablet Take 1 tablet (10 mg total) by mouth daily. 90 tablet 3   buPROPion (WELLBUTRIN XL) 300 MG 24 hr tablet Take 1 tablet (300 mg total) by mouth daily. 90 tablet 3   carvedilol (COREG) 6.25 MG tablet Take 1 tablet (6.25 mg total) by mouth 2 (two) times daily with a meal. 60 tablet 2   dapagliflozin propanediol (FARXIGA) 10 MG TABS tablet Take 1 tablet (10 mg total) by mouth daily. 30 tablet 2   digoxin (LANOXIN) 0.125 MG tablet Take 1 tablet (0.125 mg total) by mouth daily. 30 tablet 2   fluticasone (FLONASE) 50 MCG/ACT nasal spray SHAKE LIQUID AND USE 1 SPRAY IN EACH NOSTRIL DAILY     potassium chloride SA (KLOR-CON M) 20 MEQ tablet Take 2 tablets (40 mEq total) by mouth daily. 60 tablet 2   sacubitril-valsartan (ENTRESTO) 97-103 MG Take 1  tablet by mouth 2 (two) times daily. 60 tablet 11   spironolactone (ALDACTONE) 25 MG tablet Take 1 tablet (25 mg total) by mouth daily. 30 tablet 2   torsemide (DEMADEX) 20 MG tablet TAKE 2 TABLETS(40 MG) BY MOUTH TWICE DAILY 120 tablet 2   traZODone (DESYREL) 50 MG tablet Take 2 tablets (100 mg total) by mouth at bedtime. 180 tablet 3   VENTOLIN HFA 108 (90 Base) MCG/ACT inhaler Inhale 1-2 puffs into the lungs every 4 (four) hours as needed for wheezing or shortness of breath. 18 g 3   No current facility-administered medications on file prior to visit.   Past Medical History:  Diagnosis Date   Anxiety    Cerebral embolism with cerebral infarction 04/28/2020   Chronic systolic CHF 04/24/2020   Echo at [facility] 04/2020: EF < 20, mild to mod MR, LV thrombus   Depression    Dilated cardiomyopathy (HCC)    History of COVID-19    Hypertension    LV (left ventricular) mural thrombus    Past Surgical History:  Procedure Laterality Date   TONSILLECTOMY      Family History  Problem Relation Age of Onset   Diabetes Mother    Diabetes Paternal Grandfather    Heart failure Neg Hx    Social History   Socioeconomic History   Marital status: Single    Spouse name: Not on file   Number of children: Not on file   Years of education: Not on file   Highest education level: Not on file  Occupational History   Occupation: Full time/Plumbing  Tobacco Use   Smoking status: Former    Types: E-cigarettes   Smokeless tobacco: Current    Types: Chew   Tobacco comments:    quit around June 2021  Vaping Use   Vaping status: Every Day   Substances: Nicotine  Substance and Sexual Activity   Alcohol use: Not Currently    Alcohol/week: 7.0 standard drinks of alcohol    Types: 7 Cans of beer per week   Drug use: Not Currently    Types: Marijuana, Methamphetamines   Sexual activity: Yes    Partners: Female    Birth control/protection: Condom  Other Topics Concern   Not on file  Social  History Narrative   Lives with grandmother, grandfather and sister   Right Handed   Drinks rarely caffeine   Social Determinants of Health   Financial Resource Strain: Low Risk  (01/31/2023)   Overall Financial Resource Strain (CARDIA)    Difficulty of Paying Living  Expenses: Not hard at all  Food Insecurity: No Food Insecurity (12/17/2022)   Hunger Vital Sign    Worried About Running Out of Food in the Last Year: Never true    Ran Out of Food in the Last Year: Never true  Transportation Needs: No Transportation Needs (12/17/2022)   PRAPARE - Administrator, Civil Service (Medical): No    Lack of Transportation (Non-Medical): No  Physical Activity: Inactive (01/31/2023)   Exercise Vital Sign    Days of Exercise per Week: 0 days    Minutes of Exercise per Session: 0 min  Stress: No Stress Concern Present (01/31/2023)   [employer] of Occupational Health - Occupational Stress Questionnaire    Feeling of Stress : Not at all  Social Connections: Unknown (02/24/2023)   [facility]   Social Network    Social Network: Not on file  Recent Concern: Social Connections - Moderately Isolated (01/31/2023)   Social Connection and Isolation Panel [NHANES]    Frequency of Communication with Friends and Family: More than three times a week    Frequency of Social Gatherings with Friends and Family: More than three times a week    Attends Religious Services: More than 4 times per year    Active Member of [employer] or Organizations: No    Attends Engineer, structural: Never    Marital Status: Never married    Objective:  BP 130/88   Pulse (!) 107   Temp (!) 97.5 F (36.4 C)   Resp 14   Ht 5\' 11"  (1.803 m)   Wt (!) 328 lb (148.8 kg)   SpO2 97%   BMI 45.75 kg/m      06/03/2023   10:08 AM 04/16/2023   11:22 AM 02/28/2023    1:49 PM  BP/Weight  Systolic BP 130 120   Diastolic BP 88 70   Wt. (Lbs) 328 314.4 312.4  BMI 45.75 kg/m2 43.85 kg/m2 43.57 kg/m2     Physical Exam Vitals reviewed.  Constitutional:      Appearance: Normal appearance.  Cardiovascular:     Rate and Rhythm: Normal rate and regular rhythm.     Heart sounds: Normal heart sounds.  Pulmonary:     Effort: Pulmonary effort is normal.     Breath sounds: Examination of the right-middle field reveals wheezing and rales. Examination of the left-middle field reveals wheezing. Wheezing and rales present.  Abdominal:     General: Bowel sounds are normal.     Palpations: Abdomen is soft.     Tenderness: There is no abdominal tenderness.  Neurological:     Mental Status: He is alert and oriented to person, place, and time.  Psychiatric:        Mood and Affect: Mood normal.        Behavior: Behavior normal.     Diabetic Foot Exam - Simple   No data filed      Lab Results  Component Value Date   WBC 8.0 04/16/2023   HGB 13.8 04/16/2023   HCT 40.4 04/16/2023   PLT 224 04/16/2023   GLUCOSE 90 04/16/2023   CHOL 196 01/31/2023   TRIG 296 (H) 01/31/2023   HDL 28 (L) 01/31/2023   LDLCALC 116 (H) 01/31/2023   ALT 27 01/31/2023   AST 16 01/31/2023   NA 136 04/16/2023   K 3.9 04/16/2023   CL 106 04/16/2023   CREATININE 0.91 04/16/2023   BUN 9 04/16/2023   CO2 21 (  L) 04/16/2023   TSH 3.700 01/31/2023   INR 1.1 12/17/2022   HGBA1C 5.7 (H) 01/31/2023      Assessment & Plan:    Attention deficit hyperactivity disorder (ADHD), combined type Assessment & Plan: Stable on current medication regimen. -Continue Strattera 40mg  daily.  Orders: -     Atomoxetine HCl; Take 1 capsule (40 mg total) by mouth daily.  Dispense: 30 capsule; Refill: 0  Acute URI Assessment & Plan: Acute onset of symptoms for 6 days with yellow sputum, chest congestion, and sinus congestion. No fever or headache. Possible exposure from sick family members. -Start Doxycycline 100mg  twice daily for 10 days. -Order chest X-ray to rule out pneumonia.  Orders: -     POC COVID-19 BinaxNow -      POCT Influenza A/B -     Doxycycline Hyclate; Take 1 tablet (100 mg total) by mouth 2 (two) times daily.  Dispense: 20 tablet; Refill: 0 -     DG Chest 2 View; Future  Mixed hyperlipidemia Assessment & Plan: Well controlled.  Continue to work on eating a healthy diet and exercise.  Labs drawn today.   No major side effects reported, and no issues with compliance. The current medical regimen is effective;  continue present plan with Lipitor 10mg , Coreg 6.25mg  Will adjust medication as needed depending on labs Lab Results  Component Value Date   LDLCALC 116 (H) 01/31/2023     Orders: -     CBC with Differential/Platelet -     Comprehensive metabolic panel -     Hemoglobin A1c -     Lipid panel  Seasonal allergies Assessment & Plan: Chronic symptoms, possibly contributing to current upper respiratory symptoms. -Refill Allegra as needed.  Orders: -     Fexofenadine HCl; Take 1 tablet (180 mg total) by mouth daily.  Dispense: 90 tablet; Refill: 3  Dilated cardiomyopathy (HCC) Assessment & Plan: On Digoxin, recent lab showed low levels. No recent cardiac complaints. -Continue Digoxin as prescribed. -Order labs for kidney, liver function, cholesterol, A1c, and Digoxin level.  Orders: -     Digoxin level     Meds ordered this encounter  Medications   fexofenadine (ALLEGRA ALLERGY) 180 MG tablet    Sig: Take 1 tablet (180 mg total) by mouth daily.    Dispense:  90 tablet    Refill:  3   atomoxetine (STRATTERA) 40 MG capsule    Sig: Take 1 capsule (40 mg total) by mouth daily.    Dispense:  30 capsule    Refill:  0   doxycycline (VIBRA-TABS) 100 MG tablet    Sig: Take 1 tablet (100 mg total) by mouth 2 (two) times daily.    Dispense:  20 tablet    Refill:  0    Orders Placed This Encounter  Procedures   DG Chest 2 View   CBC with Differential/Platelet   Comprehensive metabolic panel   Hemoglobin A1c   Lipid panel   Digoxin level   POC COVID-19   POCT  Influenza A/B    Assessment and Plan   -Encourage consistent medication adherence. -Follow-up after completion of antibiotic course.       Follow-up: Return in about 3 months (around 09/01/2023) for Chronic, Huston Foley.   I,Marla I Leal-Borjas,acting as a scribe for [employer], PA.,have documented all relevant documentation on the behalf of Langley Gauss, PA,as directed by  Langley Gauss, PA while in the presence of Langley Gauss, [place].   An After Visit Summary  was printed and given to the patient.  Langley Gauss, [place] [facility] [phone]

## 2023-06-03 NOTE — Assessment & Plan Note (Signed)
Stable on current medication regimen. -Continue Strattera 40mg  daily.

## 2023-06-04 ENCOUNTER — Encounter: Payer: Self-pay | Admitting: Physician Assistant

## 2023-06-04 LAB — DIGOXIN LEVEL: Digoxin, Serum: 0.5 ng/mL (ref 0.5–0.9)

## 2023-06-04 LAB — LIPID PANEL
Chol/HDL Ratio: 4 {ratio} (ref 0.0–5.0)
Cholesterol, Total: 133 mg/dL (ref 100–199)
HDL: 33 mg/dL — ABNORMAL LOW (ref >39)
LDL Chol Calc (NIH): 81 mg/dL (ref 0–99)
Triglycerides: 104 mg/dL (ref 0–149)
VLDL Cholesterol Cal: 19 mg/dL (ref 5–40)

## 2023-06-04 LAB — CBC WITH DIFFERENTIAL/PLATELET
Basophils Absolute: 0.1 10*3/uL (ref 0.0–0.2)
Basos: 1 %
EOS (ABSOLUTE): 0.3 10*3/uL (ref 0.0–0.4)
Eos: 3 %
Hematocrit: 49.9 % (ref 37.5–51.0)
Hemoglobin: 16.3 g/dL (ref 13.0–17.7)
Immature Grans (Abs): 0 10*3/uL (ref 0.0–0.1)
Immature Granulocytes: 0 %
Lymphocytes Absolute: 1.8 10*3/uL (ref 0.7–3.1)
Lymphs: 19 %
MCH: 29.5 pg (ref 26.6–33.0)
MCHC: 32.7 g/dL (ref 31.5–35.7)
MCV: 90 fL (ref 79–97)
Monocytes Absolute: 0.9 10*3/uL (ref 0.1–0.9)
Monocytes: 9 %
Neutrophils Absolute: 6.6 10*3/uL (ref 1.4–7.0)
Neutrophils: 68 %
Platelets: 243 10*3/uL (ref 150–450)
RBC: 5.53 x10E6/uL (ref 4.14–5.80)
RDW: 12.5 % (ref 11.6–15.4)
WBC: 9.6 10*3/uL (ref 3.4–10.8)

## 2023-06-04 LAB — COMPREHENSIVE METABOLIC PANEL
ALT: 20 [IU]/L (ref 0–44)
AST: 19 [IU]/L (ref 0–40)
Albumin: 5 g/dL (ref 4.3–5.2)
Alkaline Phosphatase: 65 [IU]/L (ref 44–121)
BUN/Creatinine Ratio: 13 (ref 9–20)
BUN: 13 mg/dL (ref 6–20)
Bilirubin Total: 0.8 mg/dL (ref 0.0–1.2)
CO2: 21 mmol/L (ref 20–29)
Calcium: 10.3 mg/dL — ABNORMAL HIGH (ref 8.7–10.2)
Chloride: 100 mmol/L (ref 96–106)
Creatinine, Ser: 1.02 mg/dL (ref 0.76–1.27)
Globulin, Total: 2.6 g/dL (ref 1.5–4.5)
Glucose: 96 mg/dL (ref 70–99)
Potassium: 4.6 mmol/L (ref 3.5–5.2)
Sodium: 140 mmol/L (ref 134–144)
Total Protein: 7.6 g/dL (ref 6.0–8.5)
eGFR: 105 mL/min/{1.73_m2} (ref >59)

## 2023-06-04 LAB — HEMOGLOBIN A1C
Est. average glucose Bld gHb Est-mCnc: 114 mg/dL
Hgb A1c MFr Bld: 5.6 % (ref 4.8–5.6)

## 2023-07-13 ENCOUNTER — Other Ambulatory Visit: Payer: Self-pay | Admitting: Physician Assistant

## 2023-07-13 DIAGNOSIS — F902 Attention-deficit hyperactivity disorder, combined type: Secondary | ICD-10-CM

## 2023-07-15 NOTE — Progress Notes (Addendum)
ADVANCED HF CLINIC NOTE   PCP: Langley Gauss, PA  Cardiology: Dr. [name]  Chief complaint: Heart failure follow up   Joel [name] is 28 y.o. male with a hx of hypertension, anxiety and prior COVID-19 infection 6/21. Initially presented to [facility] 10/21 with SOB and hemoptysis x 1 month.  Echo showed EF < 20% and an LV thrombus. CT scan with multifocal pneumonia, no PE.  He was started on furosemide and Lovenox and transferred to [facility] for further evaluation and management.  He was started on milrinone with low output by co-ox and diuresed. Cardiac MRI showed severe LV dilation with diffuse hypokinesis, EF 15%, small LV apical thrombus, moderate RV dilation with EF 19%, LGE pattern concerning for myocarditis with diffuse patchy mid-wall LGE.  He was treated with ceftriaxone and doxycycline for PNA. He had a CVA with left-sided weakness and left facial numbness.  MRI showed multifocal bilateral infarcts consistent with cardio-embolism.  He was transitioned from Lovenox/warfarin to IV heparin gtt/warfarin.  Left-sided weakness resolved. He was titrated onto cardiac meds and eventually discharged home.   Echo 3/22: EF 35% with GHK, mild LV dilation, mildly decreased RV systolic function. No LV thrombus.     Echo 9/22: EF 45% with mild LVH, diffuse hypokinesis, normal RV, no thrombus.   Echo 9/23: EF 40-45%, RV mildly reduced  Admitted 6/24 with a/c CHF 2/2 to medication noncompliance and recurrence of substance abuse. Echo showed EF < 15%, RV normal. Diuresed with IV lasix, concern for low output with elevated lactic acid and low CO2. GDMT titrated.  After this admission, he went to an inpatient rehab facility in [place] and quit drinking and using amphetamines.   Echo  10/24: EF improved to 35-40% with normal RV.   Today he returns for AHF follow up with his grandfather. Overall feeling good. Denies palpitations, CP, dizziness, edema, or PND/Orthopnea. SOB with activity.  Appetite too good. No fever or chills. Does not weight at home. Taking all medications but does miss doses, mostly on the weekend. Last had Torsemide Saturday morning. Denies ETOH or drug use. Smokes 1/2-1 PPD. Works for [employer].   ECG: ST 103 bpm (Personally reviewed)    ReDS 46%  PMH: 1. H/o COVID-19 PNA 6/21.  2. HTN 3. Anxiety 4. Chronic systolic CHF: Nonischemic cardiomyopathy.   - Echo (10/21): EF < 20%, LV thrombus.  - Cardiac MRI (10/21): Severe LV dilation with diffuse hypokinesis, EF 15%, small LV apical thrombus, moderate RV dilation with EF 19%, LGE pattern concerning for myocarditis, with diffuse patchy mid-wall LGE. - Echo (3/22): EF 35% with global hypokinesis, mild LV dilation, mildly decreased RV systolic function. No LV thrombus. - Echo (9/22): EF 45% with mild LVH, diffuse hypokinesis, normal RV, no thrombus.  - Echo (9/23): EF 40-45% with moderate LVH, global hypokinesis, RV mildly reduced, no thrombus - Echo (6/24): EF < 15, RV normal, no thrombus - Echo (10/24): EF 35-40%, normal RV 5. CVA: 10/21, suspect cardio-embolic.  6. LV apical thrombus 7. Sleep study negative.  8. Crystal meth abuse  Social History   Socioeconomic History   Marital status: Single    Spouse name: Not on file   Number of children: Not on file   Years of education: Not on file   Highest education level: Not on file  Occupational History   Occupation: Full time/Plumbing  Tobacco Use   Smoking status: Former    Types: E-cigarettes   Smokeless tobacco: Current  Types: Chew   Tobacco comments:    quit around June 2021  Vaping Use   Vaping status: Every Day   Substances: Nicotine  Substance and Sexual Activity   Alcohol use: Not Currently    Alcohol/week: 7.0 standard drinks of alcohol    Types: 7 Cans of beer per week   Drug use: Not Currently    Types: Marijuana, Methamphetamines   Sexual activity: Yes    Partners: Female    Birth control/protection: Condom  Other  Topics Concern   Not on file  Social History Narrative   Lives with grandmother, grandfather and sister   Right Handed   Drinks rarely caffeine   Social Drivers of Health   Financial Resource Strain: Low Risk  (01/31/2023)   Overall Financial Resource Strain (CARDIA)    Difficulty of Paying Living Expenses: Not hard at all  Food Insecurity: No Food Insecurity (12/17/2022)   Hunger Vital Sign    Worried About Running Out of Food in the Last Year: Never true    Ran Out of Food in the Last Year: Never true  Transportation Needs: No Transportation Needs (12/17/2022)   PRAPARE - Administrator, Civil Service (Medical): No    Lack of Transportation (Non-Medical): No  Physical Activity: Inactive (01/31/2023)   Exercise Vital Sign    Days of Exercise per Week: 0 days    Minutes of Exercise per Session: 0 min  Stress: No Stress Concern Present (01/31/2023)   [employer] of Occupational Health - Occupational Stress Questionnaire    Feeling of Stress : Not at all  Social Connections: Unknown (02/24/2023)   Received from [facility]   Social Network    Social Network: Not on file  Recent Concern: Social Connections - Moderately Isolated (01/31/2023)   Social Connection and Isolation Panel [NHANES]    Frequency of Communication with Friends and Family: More than three times a week    Frequency of Social Gatherings with Friends and Family: More than three times a week    Attends Religious Services: More than 4 times per year    Active Member of [employer] or Organizations: No    Attends Banker Meetings: Never    Marital Status: Never married  Intimate Partner Violence: Unknown (02/24/2023)   Received from [facility]   HITS    Physically Hurt: Not on file    Insult or Talk Down To: Not on file    Threaten Physical Harm: Not on file    Scream or Curse: Not on file   Family History  Problem Relation Age of Onset   Diabetes Mother    Diabetes Paternal Grandfather     Heart failure Neg Hx    ROS: All systems reviewed and negative except as per HPI.   Current Outpatient Medications  Medication Sig Dispense Refill   apixaban (ELIQUIS) 5 MG TABS tablet Take 1 tablet (5 mg total) by mouth 2 (two) times daily. 60 tablet 2   atomoxetine (STRATTERA) 40 MG capsule TAKE 1 CAPSULE(40 MG) BY MOUTH DAILY 30 capsule 0   atorvastatin (LIPITOR) 10 MG tablet Take 1 tablet (10 mg total) by mouth daily. 90 tablet 3   buPROPion (WELLBUTRIN XL) 300 MG 24 hr tablet Take 1 tablet (300 mg total) by mouth daily. 90 tablet 3   dapagliflozin propanediol (FARXIGA) 10 MG TABS tablet Take 1 tablet (10 mg total) by mouth daily. 30 tablet 2   digoxin (LANOXIN) 0.125 MG tablet Take 1  tablet (0.125 mg total) by mouth daily. 30 tablet 2   fexofenadine (ALLEGRA ALLERGY) 180 MG tablet Take 1 tablet (180 mg total) by mouth daily. 90 tablet 3   fluticasone (FLONASE) 50 MCG/ACT nasal spray as needed.     potassium chloride SA (KLOR-CON M) 20 MEQ tablet Take 2 tablets (40 mEq total) by mouth daily. 60 tablet 2   sacubitril-valsartan (ENTRESTO) 97-103 MG Take 1 tablet by mouth 2 (two) times daily. 60 tablet 11   spironolactone (ALDACTONE) 25 MG tablet Take 1 tablet (25 mg total) by mouth daily. 30 tablet 2   torsemide (DEMADEX) 20 MG tablet TAKE 2 TABLETS(40 MG) BY MOUTH TWICE DAILY 120 tablet 2   traZODone (DESYREL) 50 MG tablet Take 2 tablets (100 mg total) by mouth at bedtime. 180 tablet 3   VENTOLIN HFA 108 (90 Base) MCG/ACT inhaler Inhale 1-2 puffs into the lungs every 4 (four) hours as needed for wheezing or shortness of breath. 18 g 3   carvedilol (COREG) 12.5 MG tablet Take 1 tablet (12.5 mg total) by mouth 2 (two) times daily with a meal. 60 tablet 3   No current facility-administered medications for this encounter.   Wt Readings from Last 3 Encounters:  07/22/23 (!) 148.7 kg (327 lb 12.8 oz)  06/03/23 (!) 148.8 kg (328 lb)  04/16/23 (!) 142.6 kg (314 lb 6.4 oz)   BP (!) 154/100    Pulse (!) 117   Wt (!) 148.7 kg (327 lb 12.8 oz)   SpO2 97%   BMI 45.72 kg/m   Physical exam:  General:  well appearing.  No respiratory difficulty. Walked into clinic HEENT: normal Neck: supple. JVD difficult to see. Carotids 2+ bilat; no bruits. No lymphadenopathy or thyromegaly appreciated. Cor: PMI nondisplaced. Tachy/regular rate & rhythm. No rubs, gallops or murmurs. Lungs: clear, diminished bases Abdomen: soft, nontender, nondistended. No hepatosplenomegaly. No bruits or masses. Good bowel sounds. Extremities: no cyanosis, clubbing, rash, edema  Neuro: alert & oriented x 3, cranial nerves grossly intact. moves all 4 extremities w/o difficulty. Affect pleasant.   Assessment/Plan: 1. Chronic Systolic Heart Failure: NICM. Diagnosed post COVID infection in 2021. Echo 10/21 EF < 20% + LV thrombus. cMRI with LVEF 15%, RVEF 19%, LGE pattern concerning for myocarditis. Suspect most likely etiology for cardiomyopathy is viral myocarditis due to COVID-19 though methamphetamine use may have contributed as well. Echo in 3/22 showed improved EF to 35%. Echo 9/22 showed EF up to 45%. Echo in 6/24 in the setting of relapsed substance abuse and poor medication compliance showed EF 15%, RV normal.  He is now off drugs/ETOH and is taking his meds.  Echo 10/24 showed EF 35-40%, normal RV, improved.  - NYHA class I-II, not volume overloaded on physical exam but weight up and ReDS high.  - Continue torsemide 40 mg bid.  BMET/BNP today. Will not increase dose, encouraged him to be compliant with diuretics so that the fluid will come off.   - Continue digoxin 0.125 mg daily. Dig level 0.5 12/24   - Increase Coreg 6.25>12.5 mg bid.  - Continue Entresto 97/103 bid.  - Continue spiro 25 mg daily.  - Continue Farxiga 10 mg daily.  - EF is increasing and appears to be out of ICD range.   2. Hypertension: BP well controlled.   3. Polysubstance Use/ Stimulant Use Disorder: UDS + for amphetamines and THC  during 6/24 admission.  Completed inpatient rehab in [facility].  He reports no ETOH/drugs now. Has been clean  for ~7 months now  4. H/o LV Thrombus w/ cardioembolic CVA: Occurred in 2021. No LV thrombus seen on recent echo.  - Continue Eliquis 5 mg bid, check CBC today.   5. Hyperlipidemia: On atorvastatin 10 mg daily - LDL 81 mg 12/24, improving - Repeat lipid panel ~3 months.   7. Obesity: Body mass index is 45.72 kg/m. - Previous insurance would not cover GLP-1 agonist. Now has new insurance, will rerefer to Pharmacy clinic for GLP1.   Follow up in 3 months with APP  Alen Bleacher  07/22/2023

## 2023-07-19 ENCOUNTER — Telehealth (HOSPITAL_COMMUNITY): Payer: Self-pay

## 2023-07-19 NOTE — Telephone Encounter (Signed)
Called and left patient a voice message to confirm/remind patient of their appointment at the [facility] on 07/22/23.   And to bring in all medications and/or complete list.

## 2023-07-22 ENCOUNTER — Ambulatory Visit (HOSPITAL_COMMUNITY)
Admission: RE | Admit: 2023-07-22 | Discharge: 2023-07-22 | Disposition: A | Discharge status: Home / Self Care | Payer: BC Managed Care – PPO | Source: Ambulatory Visit | Attending: Internal Medicine

## 2023-07-22 ENCOUNTER — Encounter (HOSPITAL_COMMUNITY): Payer: Self-pay

## 2023-07-22 VITALS — BP 154/100 | HR 117 | Wt 327.8 lb

## 2023-07-22 DIAGNOSIS — F17220 Nicotine dependence, chewing tobacco, uncomplicated: Secondary | ICD-10-CM | POA: Diagnosis not present

## 2023-07-22 DIAGNOSIS — I5020 Unspecified systolic (congestive) heart failure: Secondary | ICD-10-CM

## 2023-07-22 DIAGNOSIS — E669 Obesity, unspecified: Secondary | ICD-10-CM | POA: Diagnosis not present

## 2023-07-22 DIAGNOSIS — I110 Hypertensive heart disease with heart failure: Secondary | ICD-10-CM | POA: Diagnosis not present

## 2023-07-22 DIAGNOSIS — Z7901 Long term (current) use of anticoagulants: Secondary | ICD-10-CM | POA: Insufficient documentation

## 2023-07-22 DIAGNOSIS — Z6841 Body Mass Index (BMI) 40.0 and over, adult: Secondary | ICD-10-CM | POA: Diagnosis not present

## 2023-07-22 DIAGNOSIS — I10 Essential (primary) hypertension: Secondary | ICD-10-CM | POA: Diagnosis not present

## 2023-07-22 DIAGNOSIS — E785 Hyperlipidemia, unspecified: Secondary | ICD-10-CM | POA: Diagnosis not present

## 2023-07-22 DIAGNOSIS — R9431 Abnormal electrocardiogram [ECG] [EKG]: Secondary | ICD-10-CM | POA: Insufficient documentation

## 2023-07-22 DIAGNOSIS — I5022 Chronic systolic (congestive) heart failure: Secondary | ICD-10-CM | POA: Insufficient documentation

## 2023-07-22 DIAGNOSIS — I240 Acute coronary thrombosis not resulting in myocardial infarction: Secondary | ICD-10-CM

## 2023-07-22 DIAGNOSIS — Z79899 Other long term (current) drug therapy: Secondary | ICD-10-CM | POA: Insufficient documentation

## 2023-07-22 DIAGNOSIS — R000 Tachycardia, unspecified: Secondary | ICD-10-CM | POA: Diagnosis not present

## 2023-07-22 DIAGNOSIS — Z8616 Personal history of COVID-19: Secondary | ICD-10-CM | POA: Diagnosis present

## 2023-07-22 DIAGNOSIS — F1511 Other stimulant abuse, in remission: Secondary | ICD-10-CM | POA: Diagnosis not present

## 2023-07-22 DIAGNOSIS — Z8673 Personal history of transient ischemic attack (TIA), and cerebral infarction without residual deficits: Secondary | ICD-10-CM | POA: Diagnosis not present

## 2023-07-22 DIAGNOSIS — F419 Anxiety disorder, unspecified: Secondary | ICD-10-CM | POA: Diagnosis not present

## 2023-07-22 DIAGNOSIS — F1211 Cannabis abuse, in remission: Secondary | ICD-10-CM | POA: Insufficient documentation

## 2023-07-22 DIAGNOSIS — I428 Other cardiomyopathies: Secondary | ICD-10-CM | POA: Diagnosis not present

## 2023-07-22 DIAGNOSIS — E782 Mixed hyperlipidemia: Secondary | ICD-10-CM

## 2023-07-22 DIAGNOSIS — F1910 Other psychoactive substance abuse, uncomplicated: Secondary | ICD-10-CM

## 2023-07-22 LAB — CBC
HCT: 52 % (ref 39.0–52.0)
Hemoglobin: 17.9 g/dL — ABNORMAL HIGH (ref 13.0–17.0)
MCH: 28.5 pg (ref 26.0–34.0)
MCHC: 34.4 g/dL (ref 30.0–36.0)
MCV: 82.9 fL (ref 80.0–100.0)
Platelets: 248 10*3/uL (ref 150–400)
RBC: 6.27 MIL/uL — ABNORMAL HIGH (ref 4.22–5.81)
RDW: 12.5 % (ref 11.5–15.5)
WBC: 9.3 10*3/uL (ref 4.0–10.5)
nRBC: 0 % (ref 0.0–0.2)

## 2023-07-22 LAB — BASIC METABOLIC PANEL
Anion gap: 8 (ref 5–15)
BUN: 15 mg/dL (ref 6–20)
CO2: 26 mmol/L (ref 22–32)
Calcium: 10.1 mg/dL (ref 8.9–10.3)
Chloride: 103 mmol/L (ref 98–111)
Creatinine, Ser: 1.04 mg/dL (ref 0.61–1.24)
GFR, Estimated: >60 mL/min (ref >60)
Glucose, Bld: 131 mg/dL — ABNORMAL HIGH (ref 70–99)
Potassium: 4 mmol/L (ref 3.5–5.1)
Sodium: 137 mmol/L (ref 135–145)

## 2023-07-22 LAB — BRAIN NATRIURETIC PEPTIDE: B Natriuretic Peptide: 35.6 pg/mL (ref 0.0–100.0)

## 2023-07-22 MED ORDER — CARVEDILOL 12.5 MG PO TABS: 12.5000 mg | ORAL_TABLET | Freq: Two times a day (BID) | ORAL | 3 refills | Status: AC

## 2023-07-22 NOTE — Progress Notes (Signed)
ReDS Vest / Clip - 07/22/23 1100       ReDS Vest / Clip   Station Marker D    Ruler Value 42    ReDS Value Range High volume overload   low quality   ReDS Actual Value 46

## 2023-07-22 NOTE — Patient Instructions (Addendum)
Medication Changes:  INCREASE CARVEDILOL TO 12.5MG  TWICE DAILY   Lab Work:  Labs done today, your results will be available in MyChart, we will contact you for abnormal readings.  THEN AGAIN IN 1 WEEK- PLEASE GO TO [facility] FOR THIS  Located in: [facility] Address: [address] [place], [place], [place] [address]  Referrals:  YOU HAVE BEEN REFERRED TO [facility] THEY WILL REACH OUT TO YOU OR CALL TO ARRANGE THIS. PLEASE CALL us WITH ANY CONCERNS   Follow-Up in: 3 MONTHS WITH APP AS SCHEDULED   At [facility], you and your health needs are our priority. We have a designated team specialized in the treatment of Heart Failure. This Care Team includes your primary Heart Failure Specialized Cardiologist (physician), Advanced Practice Providers (APPs- Physician Assistants and Nurse Practitioners), and Pharmacist who all work together to provide you with the care you need, when you need it.   You may see any of the following providers on your designated Care Team at your next follow up:  Dr. Arvilla Meres Dr. [name] Dr. Dorthula Nettles Dr. Theresia Bough Tonye Becket, NP Robbie Lis, [place] [facility] [place], [place] Brynda Peon, NP [place] Lee, NP Karle Plumber, PharmD   Please be sure to bring in all your medications bottles to every appointment.   Need to Contact us:  If you have any questions or concerns before your next appointment please send [place] a message through [place] or call our office at [phone].    TO LEAVE A MESSAGE FOR THE NURSE SELECT OPTION 2, PLEASE LEAVE A MESSAGE INCLUDING: YOUR NAME DATE OF BIRTH CALL BACK NUMBER REASON FOR CALL**this is important as we prioritize the call backs  YOU WILL RECEIVE A CALL BACK THE SAME DAY AS LONG AS YOU CALL BEFORE 4:00 PM

## 2023-09-06 ENCOUNTER — Ambulatory Visit: Payer: BC Managed Care – PPO | Admitting: Physician Assistant

## 2023-09-06 NOTE — Progress Notes (Deleted)
 Subjective:  Patient ID: Joel [name], male    DOB: 1996/01/29  Age: 28 y.o. MRN: [id number]  No chief complaint on file.   Discussed the use of AI scribe software for clinical note transcription with the patient, who gave verbal consent to proceed.    HPI:  Joel [name], a 28 year old male with a history of ADHD, Hypertension, Prediabetes, and Hyperlipidemia.  Diabetes:   Hyperlipidemia: Current medications: Lipitor 10 mg daily  Hypertension: Complications: Current medications: Coreg 12.5 mg,  ADHD: Strattera 40 mg  Diet: Exercise:         06/03/2023   10:20 AM 01/31/2023    1:54 PM 01/29/2022    1:39 PM 03/06/2021    8:24 PM 10/05/2020    2:03 PM  Depression screen PHQ 2/9  Decreased Interest 0 0 1 0 0  Down, Depressed, Hopeless 0 0 0 0 0  PHQ - 2 Score 0 0 1 0 0  Altered sleeping 0 0 1    Tired, decreased energy 0 0 1    Change in appetite 0 0 2    Feeling bad or failure about yourself  0 0 1    Trouble concentrating 0 1 3    Moving slowly or fidgety/restless 0 0 3    Suicidal thoughts 0 0 0    PHQ-9 Score 0 1 12    Difficult doing work/chores Not difficult at all Not difficult at all Somewhat difficult          06/03/2023   10:20 AM  Fall Risk   Falls in the past year? 0  Number falls in past yr: 0  Injury with Fall? 0  Risk for fall due to : No Fall Risks  Follow up Education provided    Patient Care Team: Langley Gauss, PA as PCP - General (Physician Assistant) Laurey Morale, MD as PCP - Advanced Heart Failure (Cardiology)   Review of Systems  Current Outpatient Medications on File Prior to Visit  Medication Sig Dispense Refill   apixaban (ELIQUIS) 5 MG TABS tablet Take 1 tablet (5 mg total) by mouth 2 (two) times daily. 60 tablet 2   atomoxetine (STRATTERA) 40 MG capsule TAKE 1 CAPSULE(40 MG) BY MOUTH DAILY 30 capsule 0   atorvastatin (LIPITOR) 10 MG tablet Take 1 tablet (10 mg total) by mouth daily. 90 tablet 3   buPROPion (WELLBUTRIN XL)  300 MG 24 hr tablet Take 1 tablet (300 mg total) by mouth daily. 90 tablet 3   carvedilol (COREG) 12.5 MG tablet Take 1 tablet (12.5 mg total) by mouth 2 (two) times daily with a meal. 60 tablet 3   dapagliflozin propanediol (FARXIGA) 10 MG TABS tablet Take 1 tablet (10 mg total) by mouth daily. 30 tablet 2   digoxin (LANOXIN) 0.125 MG tablet Take 1 tablet (0.125 mg total) by mouth daily. 30 tablet 2   fexofenadine (ALLEGRA ALLERGY) 180 MG tablet Take 1 tablet (180 mg total) by mouth daily. 90 tablet 3   fluticasone (FLONASE) 50 MCG/ACT nasal spray as needed.     potassium chloride SA (KLOR-CON M) 20 MEQ tablet Take 2 tablets (40 mEq total) by mouth daily. 60 tablet 2   sacubitril-valsartan (ENTRESTO) 97-103 MG Take 1 tablet by mouth 2 (two) times daily. 60 tablet 11   spironolactone (ALDACTONE) 25 MG tablet Take 1 tablet (25 mg total) by mouth daily. 30 tablet 2   torsemide (DEMADEX) 20 MG tablet TAKE 2 TABLETS(40 MG) BY MOUTH TWICE DAILY 120  tablet 2   traZODone (DESYREL) 50 MG tablet Take 2 tablets (100 mg total) by mouth at bedtime. 180 tablet 3   VENTOLIN HFA 108 (90 Base) MCG/ACT inhaler Inhale 1-2 puffs into the lungs every 4 (four) hours as needed for wheezing or shortness of breath. 18 g 3   No current facility-administered medications on file prior to visit.   Past Medical History:  Diagnosis Date   Anxiety    Cerebral embolism with cerebral infarction 04/28/2020   Chronic systolic CHF 04/24/2020   Echo at [facility] 04/2020: EF < 20, mild to mod MR, LV thrombus   Depression    Dilated cardiomyopathy (HCC)    History of COVID-19    Hypertension    LV (left ventricular) mural thrombus    Past Surgical History:  Procedure Laterality Date   TONSILLECTOMY      Family History  Problem Relation Age of Onset   Diabetes Mother    Diabetes Paternal Grandfather    Heart failure Neg Hx    Social History   Socioeconomic History   Marital status: Single    Spouse name: Not on  file   Number of children: Not on file   Years of education: Not on file   Highest education level: Not on file  Occupational History   Occupation: Full time/Plumbing  Tobacco Use   Smoking status: Former    Types: E-cigarettes   Smokeless tobacco: Current    Types: Chew   Tobacco comments:    quit around June 2021  Vaping Use   Vaping status: Every Day   Substances: Nicotine  Substance and Sexual Activity   Alcohol use: Not Currently    Alcohol/week: 7.0 standard drinks of alcohol    Types: 7 Cans of beer per week   Drug use: Not Currently    Types: Marijuana, Methamphetamines   Sexual activity: Yes    Partners: Female    Birth control/protection: Condom  Other Topics Concern   Not on file  Social History Narrative   Lives with grandmother, grandfather and sister   Right Handed   Drinks rarely caffeine   Social Drivers of Health   Financial Resource Strain: Low Risk  (01/31/2023)   Overall Financial Resource Strain (CARDIA)    Difficulty of Paying Living Expenses: Not hard at all  Food Insecurity: No Food Insecurity (12/17/2022)   Hunger Vital Sign    Worried About Running Out of Food in the Last Year: Never true    Ran Out of Food in the Last Year: Never true  Transportation Needs: No Transportation Needs (12/17/2022)   PRAPARE - Administrator, Civil Service (Medical): No    Lack of Transportation (Non-Medical): No  Physical Activity: Inactive (01/31/2023)   Exercise Vital Sign    Days of Exercise per Week: 0 days    Minutes of Exercise per Session: 0 min  Stress: No Stress Concern Present (01/31/2023)   [employer] of Occupational Health - Occupational Stress Questionnaire    Feeling of Stress : Not at all  Social Connections: Unknown (02/24/2023)   Received from [facility]   Social Network    Social Network: Not on file  Recent Concern: Social Connections - Moderately Isolated (01/31/2023)   Social Connection and Isolation Panel [NHANES]     Frequency of Communication with Friends and Family: More than three times a week    Frequency of Social Gatherings with Friends and Family: More than three times a week  Attends Religious Services: More than 4 times per year    Active Member of Clubs or Organizations: No    Attends Banker Meetings: Never    Marital Status: Never married    Objective:  There were no vitals taken for this visit.     07/22/2023   11:02 AM 06/03/2023   10:08 AM 04/16/2023   11:22 AM  BP/Weight  Systolic BP 154 130 120  Diastolic BP 100 88 70  Wt. (Lbs) 327.8 328 314.4  BMI 45.72 kg/m2 45.75 kg/m2 43.85 kg/m2    Physical Exam  Diabetic Foot Exam - Simple   No data filed      Lab Results  Component Value Date   WBC 9.3 07/22/2023   HGB 17.9 (H) 07/22/2023   HCT 52.0 07/22/2023   PLT 248 07/22/2023   GLUCOSE 131 (H) 07/22/2023   CHOL 133 06/03/2023   TRIG 104 06/03/2023   HDL 33 (L) 06/03/2023   LDLCALC 81 06/03/2023   ALT 20 06/03/2023   AST 19 06/03/2023   NA 137 07/22/2023   K 4.0 07/22/2023   CL 103 07/22/2023   CREATININE 1.04 07/22/2023   BUN 15 07/22/2023   CO2 26 07/22/2023   TSH 3.700 01/31/2023   INR 1.1 12/17/2022   HGBA1C 5.6 06/03/2023      Assessment & Plan:  Assessment and Plan       There are no diagnoses linked to this encounter.   No orders of the defined types were placed in this encounter.   No orders of the defined types were placed in this encounter.    Follow-up: No follow-ups on file.   I,Nyeemah Jennette L Contina Strain,acting as a scribe for [employer], PA.,have documented all relevant documentation on the behalf of Langley Gauss, PA,as directed by  Langley Gauss, PA while in the presence of Langley Gauss, [place].   An After Visit Summary was printed and given to the patient.  Langley Gauss, [place] [facility] [phone]

## 2023-09-13 ENCOUNTER — Other Ambulatory Visit: Payer: Self-pay | Admitting: Family Medicine

## 2023-09-27 ENCOUNTER — Other Ambulatory Visit (HOSPITAL_COMMUNITY): Payer: Self-pay

## 2023-09-27 ENCOUNTER — Telehealth: Payer: Self-pay | Admitting: Pharmacy Technician

## 2023-09-27 ENCOUNTER — Ambulatory Visit: Payer: BC Managed Care – PPO | Attending: Internal Medicine | Admitting: Pharmacist

## 2023-09-27 DIAGNOSIS — Z6841 Body Mass Index (BMI) 40.0 and over, adult: Secondary | ICD-10-CM | POA: Diagnosis not present

## 2023-09-27 DIAGNOSIS — Z8673 Personal history of transient ischemic attack (TIA), and cerebral infarction without residual deficits: Secondary | ICD-10-CM | POA: Diagnosis not present

## 2023-09-27 DIAGNOSIS — E66813 Obesity, class 3: Secondary | ICD-10-CM

## 2023-09-27 NOTE — Telephone Encounter (Signed)
 Pharmacy Patient Advocate Encounter  Received notification from  [employer]  that Prior Authorization for wegovy has been DENIED.  Full denial letter will be uploaded to the media tab. See denial reason below.  Documentation was submitted from 09/27/23 visit   PA #/Case ID/Reference #: [id number]

## 2023-09-27 NOTE — Patient Instructions (Signed)

## 2023-09-27 NOTE — Telephone Encounter (Signed)
 Pharmacy Patient Advocate Encounter   Received notification from Pt Calls Messages that prior authorization for wegovy is required/requested.   Insurance verification completed.   The patient is insured through  [employer]  .   Per test claim: PA required; PA submitted to above mentioned insurance via CoverMyMeds Key/confirmation #/EOC [id number] Status is pending

## 2023-09-27 NOTE — Progress Notes (Signed)
 Patient ID: ASBURY HAIR                 DOB: Nov 30, 1995                    MRN: [id number]     HPI: Joel [name] is a 28 y.o. male patient referred to pharmacy clinic by Brynda Peon, NP to initiate GLP1-RA therapy. PMH is significant for HF, CVA, prior alcohol and drug abuse, LV thrombus, and obesity. Most recent BMI 45.  Patient presents today to Pharm.D. clinic.  He states that his biggest issues are that he does not eat lunch and eats a very large supper.  He states that he did get a new job where he feels like he is more physically active.  Thinks he may have lost some pounds.  Not drinking any alcohol or doing any other drugs but is still smoking.  Not ready to quit.   Baseline weight and BMI: 328lb/45   Diet: Breakfast: eggs, sausage, bacon, hash-browns Lunch: doesn't eat Dinner: large meal- goes out or friend cooks- burger, specail, fried fish, mac and cheese, potatoes Drink: water, sweet tea, milk, Gatorade  Snack: weeines  Exercise: physical job- Scientific laboratory technician  Family History:  Family History  Problem Relation Age of Onset   Diabetes Mother    Diabetes Paternal Grandfather    Heart failure Neg Hx      Social History: no ETOH, no drugs, + tobacco  Labs: Lab Results  Component Value Date   HGBA1C 5.6 06/03/2023    Wt Readings from Last 1 Encounters:  07/22/23 (!) 327 lb 12.8 oz (148.7 kg)    BP Readings from Last 1 Encounters:  07/22/23 (!) 154/100   Pulse Readings from Last 1 Encounters:  07/22/23 (!) 117       Component Value Date/Time   CHOL 133 06/03/2023 1047   TRIG 104 06/03/2023 1047   HDL 33 (L) 06/03/2023 1047   CHOLHDL 4.0 06/03/2023 1047   CHOLHDL 3.1 06/29/2020 1256   VLDL 15 06/29/2020 1256   LDLCALC 81 06/03/2023 1047    Past Medical History:  Diagnosis Date   Anxiety    Cerebral embolism with cerebral infarction 04/28/2020   Chronic systolic CHF 04/24/2020   Echo at [facility] 04/2020: EF < 20, mild to mod MR, LV  thrombus   Depression    Dilated cardiomyopathy (HCC)    History of COVID-19    Hypertension    LV (left ventricular) mural thrombus     Current Outpatient Medications on File Prior to Visit  Medication Sig Dispense Refill   atomoxetine (STRATTERA) 40 MG capsule TAKE 1 CAPSULE(40 MG) BY MOUTH DAILY 30 capsule 0   atorvastatin (LIPITOR) 10 MG tablet Take 1 tablet (10 mg total) by mouth daily. 90 tablet 3   buPROPion (WELLBUTRIN XL) 300 MG 24 hr tablet Take 1 tablet (300 mg total) by mouth daily. 90 tablet 3   carvedilol (COREG) 12.5 MG tablet Take 1 tablet (12.5 mg total) by mouth 2 (two) times daily with a meal. 60 tablet 3   dapagliflozin propanediol (FARXIGA) 10 MG TABS tablet Take 1 tablet (10 mg total) by mouth daily. 30 tablet 2   digoxin (LANOXIN) 0.125 MG tablet Take 1 tablet (0.125 mg total) by mouth daily. 30 tablet 2   ELIQUIS 5 MG TABS tablet TAKE 1 TABLET(5 MG) BY MOUTH TWICE DAILY 60 tablet 2   fexofenadine (ALLEGRA ALLERGY) 180 MG tablet Take 1  tablet (180 mg total) by mouth daily. 90 tablet 3   fluticasone (FLONASE) 50 MCG/ACT nasal spray as needed.     potassium chloride SA (KLOR-CON M) 20 MEQ tablet Take 2 tablets (40 mEq total) by mouth daily. 60 tablet 2   sacubitril-valsartan (ENTRESTO) 97-103 MG Take 1 tablet by mouth 2 (two) times daily. 60 tablet 11   spironolactone (ALDACTONE) 25 MG tablet Take 1 tablet (25 mg total) by mouth daily. 30 tablet 2   torsemide (DEMADEX) 20 MG tablet TAKE 2 TABLETS(40 MG) BY MOUTH TWICE DAILY 120 tablet 2   traZODone (DESYREL) 50 MG tablet Take 2 tablets (100 mg total) by mouth at bedtime. 180 tablet 3   VENTOLIN HFA 108 (90 Base) MCG/ACT inhaler Inhale 1-2 puffs into the lungs every 4 (four) hours as needed for wheezing or shortness of breath. 18 g 3   No current facility-administered medications on file prior to visit.    No Known Allergies   Assessment/Plan:  1. Weight loss - Patient has not met goal of at least 5% of body  weight loss with comprehensive lifestyle modifications alone in the past 3-6 months. Pharmacotherapy is appropriate to pursue as augmentation. Will start Wegovy 0.25 mg weekly. Confirmed patient has no personal or family history of medullary thyroid carcinoma (MTC) or Multiple Endocrine Neoplasia syndrome type 2 (MEN 2).  Denies history of pancreatitis or gallstones. Injection technique reviewed at today's visit.  Advised patient on common side effects including nausea, diarrhea, dyspepsia, decreased appetite, and fatigue. Counseled patient on reducing meal size and how to titrate medication to minimize side effects. Counseled patient to call if intolerable side effects or if experiencing dehydration, abdominal pain, or dizziness. Patient will adhere to dietary modifications and will target at least 150 minutes of moderate intensity exercise weekly.  Stressed the importance of resistance training and long-term success, especially if patient at 1 point needs to stop medication.  Reminded patient medication does not distinguish between fat and muscle therefore strength training to keep muscle mass is important.  Will submit prior authorization for Wegovy, hopefully if they do not pay for it for obesity they will pay for it for CAD.  Follow up in 1 month via telephone for tolerability update and dose titration.  Olene Floss, Pharm.D, BCACP, CPP [facility] [address], [place], [place] [address]  Phone: [phone]; Fax: [phone]

## 2023-09-30 NOTE — Telephone Encounter (Signed)
 Patient made aware of denial. Encouraged patient to work on limiting portion sizes and increase physical activity.

## 2023-10-17 ENCOUNTER — Telehealth (HOSPITAL_COMMUNITY): Payer: Self-pay

## 2023-10-17 NOTE — Telephone Encounter (Signed)
 Called to confirm/remind patient of their appointment at the [facility] on 10/18/23.   Appointment:   [] Confirmed  [x] Left mess   [] No answer/No voice mail  [] Phone not in service  And to bring in all medications and/or complete list.

## 2023-10-18 ENCOUNTER — Encounter (HOSPITAL_COMMUNITY): Payer: BC Managed Care – PPO

## 2024-03-07 ENCOUNTER — Other Ambulatory Visit: Payer: Self-pay | Admitting: Physician Assistant
